# Patient Record
Sex: Male | Born: 1960
Health system: Southern US, Community
[De-identification: ages and names within clinical notes are randomized; demographics above are authoritative.]

## PROBLEM LIST (undated history)

## (undated) DIAGNOSIS — E785 Hyperlipidemia, unspecified: Secondary | ICD-10-CM

## (undated) HISTORY — PX: LIPOMA EXCISION: SHX5283

## (undated) HISTORY — DX: Hyperlipidemia, unspecified: E78.5

## (undated) HISTORY — PX: WISDOM TOOTH EXTRACTION: SHX21

---

## 2004-10-24 ENCOUNTER — Encounter: Admission: RE | Admit: 2004-10-24 | Discharge: 2004-10-24 | Payer: Self-pay | Admitting: Internal Medicine

## 2006-11-09 ENCOUNTER — Encounter: Admission: RE | Admit: 2006-11-09 | Discharge: 2006-11-09 | Payer: Self-pay | Admitting: Family Medicine

## 2006-11-09 ENCOUNTER — Emergency Department (HOSPITAL_COMMUNITY): Admission: EM | Admit: 2006-11-09 | Discharge: 2006-11-09 | Payer: Self-pay | Admitting: Emergency Medicine

## 2006-12-24 ENCOUNTER — Encounter: Admission: RE | Admit: 2006-12-24 | Discharge: 2006-12-24 | Payer: Self-pay | Admitting: *Deleted

## 2007-01-28 ENCOUNTER — Encounter: Admission: RE | Admit: 2007-01-28 | Discharge: 2007-01-28 | Payer: Self-pay | Admitting: *Deleted

## 2008-07-09 ENCOUNTER — Encounter: Admission: RE | Admit: 2008-07-09 | Discharge: 2008-07-09 | Payer: Self-pay | Admitting: Family Medicine

## 2010-10-09 HISTORY — PX: CHOLECYSTECTOMY: SHX55

## 2011-08-08 ENCOUNTER — Other Ambulatory Visit: Payer: Self-pay | Admitting: Family Medicine

## 2011-08-09 ENCOUNTER — Ambulatory Visit
Admission: RE | Admit: 2011-08-09 | Discharge: 2011-08-09 | Disposition: A | Discharge status: Home / Self Care | Payer: 59 | Source: Ambulatory Visit | Attending: Family Medicine | Admitting: Family Medicine

## 2011-08-25 ENCOUNTER — Encounter (INDEPENDENT_AMBULATORY_CARE_PROVIDER_SITE_OTHER): Payer: Self-pay | Admitting: General Surgery

## 2011-08-29 ENCOUNTER — Ambulatory Visit (INDEPENDENT_AMBULATORY_CARE_PROVIDER_SITE_OTHER): Payer: 59 | Admitting: General Surgery

## 2011-08-29 ENCOUNTER — Encounter (INDEPENDENT_AMBULATORY_CARE_PROVIDER_SITE_OTHER): Payer: Self-pay | Admitting: Surgery

## 2011-09-06 ENCOUNTER — Encounter (INDEPENDENT_AMBULATORY_CARE_PROVIDER_SITE_OTHER): Payer: Self-pay | Admitting: Surgery

## 2011-09-11 ENCOUNTER — Encounter (INDEPENDENT_AMBULATORY_CARE_PROVIDER_SITE_OTHER): Payer: Self-pay | Admitting: Surgery

## 2011-09-11 ENCOUNTER — Ambulatory Visit (INDEPENDENT_AMBULATORY_CARE_PROVIDER_SITE_OTHER): Payer: 59 | Admitting: Surgery

## 2011-09-11 VITALS — BP 138/88 | HR 64 | Temp 98.4°F | Resp 16 | Ht 64.0 in | Wt 151.5 lb

## 2011-09-11 DIAGNOSIS — K8010 Calculus of gallbladder with chronic cholecystitis without obstruction: Secondary | ICD-10-CM

## 2011-09-11 NOTE — Progress Notes (Signed)
Addended by: Wynona Luna on: 09/11/2011 09:54 AM   Modules accepted: Orders

## 2011-09-11 NOTE — Patient Instructions (Signed)
Call our surgery schedulers at [phone] when you are ready to schedule your surgery. 

## 2011-09-11 NOTE — Progress Notes (Signed)
Patient ID: Barry Miranda, male   DOB: 1961-03-13, 50 y.o.   MRN: [id number]  Chief Complaint  Patient presents with  . New Evaluation    eval of symptomatic GB with stones    HPI Barry Miranda is a 50 y.o. male.  Referred by Dr. Abigail Miyamoto for evaluation of gallstones HPI This is a healthy 50 year old male who presents with a three-year history of intermittent symptoms related to his gallbladder. He had a severe attack about 3 years ago but has been avoiding any greasy foods. When he does see something greasy he developed a lot of bloating and also some back pain. He denies any nausea or diarrhea. Recently he saw Dr. Abigail Miyamoto for a routine physical. Liver function tests were normal. He sent him for another ultrasound which showed multiple mobile gallstones but no sign of cholecystitis. The patient is having more symptoms and presents now for evaluation. He also had a lipoma removed many years ago from his forehead that this has recurred and become noticeably larger. He is also requesting removal of this at the same time. Past Medical History  Diagnosis Date  . Hyperlipidemia     Past Surgical History  Procedure Date  . Lipoma excision     lipoma on forehead   . Wisdom tooth extraction     Family History  Problem Relation Age of Onset  . Hypertension Mother   . Diabetes Mother     Social History History  Substance Use Topics  . Smoking status: Current Everyday Smoker -- 0.1 packs/day  . Smokeless tobacco: Never Used  . Alcohol Use: 4.2 oz/week    7 Cans of beer per week    No Known Allergies  Current Outpatient Prescriptions  Medication Sig Dispense Refill  . Cholecalciferol (VITAMIN D PO) Take by mouth daily.          Review of Systems Review of Systems  Constitutional: Negative for fever, chills and unexpected weight change.  HENT: Negative for hearing loss, congestion, sore throat, trouble swallowing and voice change.   Eyes: Negative for visual disturbance.    Respiratory: Positive for cough. Negative for wheezing.   Cardiovascular: Negative for chest pain, palpitations and leg swelling.  Gastrointestinal: Positive for abdominal distention. Negative for nausea, vomiting, abdominal pain, diarrhea, constipation, blood in stool, anal bleeding and rectal pain.  Genitourinary: Negative for hematuria and difficulty urinating.  Musculoskeletal: Negative for arthralgias.  Skin: Negative for rash and wound.  Neurological: Negative for seizures, syncope, weakness and headaches.  Hematological: Negative for adenopathy. Does not bruise/bleed easily.  Psychiatric/Behavioral: Negative for confusion.    Blood pressure 138/88, pulse 64, temperature 98.4 F (36.9 C), temperature source Temporal, resp. rate 16, height 5\' 4"  (1.626 m), weight 151 lb 8 oz (68.72 kg).  Physical Exam Physical Exam WDWN in NAD Forehead - right side - 2 cm subcutaneous lipoma HEENT:  EOMI, sclera anicteric Neck:  No masses, no thyromegaly Lungs:  CTA bilaterally; normal respiratory effort CV:  Regular rate and rhythm; no murmurs Abd:  +bowel sounds, soft, non-tender, no masses Ext:  Well-perfused; no edema Skin:  Warm, dry; no sign of jaundice  Data Reviewed Ultrasound - cholelithiasis  Assessment    Chronic calculus cholecystitis 2 cm forehead subcutaneous lipoma     Plan    Laparoscopic cholecystectomy with intraoperative cholangiogram Removal of forehead lipoma I discussed the procedure in detail.  The patient was given Agricultural engineer.  We discussed the risks and benefits of a laparoscopic cholecystectomy and  possible cholangiogram including, but not limited to bleeding, infection, injury to surrounding structures such as the intestine or liver, bile leak, retained gallstones, need to convert to an open procedure, prolonged diarrhea, blood clots such as  DVT, common bile duct injury, anesthesia risks, and possible need for additional procedures.  The likelihood  of improvement in symptoms and return to the patient's normal status is good. We discussed the typical post-operative recovery course        Lenard Kampf K. 09/11/2011, 9:46 AM

## 2011-09-13 ENCOUNTER — Other Ambulatory Visit (INDEPENDENT_AMBULATORY_CARE_PROVIDER_SITE_OTHER): Payer: Self-pay | Admitting: General Surgery

## 2011-09-15 DIAGNOSIS — D1739 Benign lipomatous neoplasm of skin and subcutaneous tissue of other sites: Secondary | ICD-10-CM

## 2011-09-15 DIAGNOSIS — K8010 Calculus of gallbladder with chronic cholecystitis without obstruction: Secondary | ICD-10-CM

## 2011-09-18 ENCOUNTER — Telehealth (INDEPENDENT_AMBULATORY_CARE_PROVIDER_SITE_OTHER): Payer: Self-pay

## 2011-09-18 NOTE — Telephone Encounter (Signed)
Pt called concerned about some bleeding in the umbilical area.  I assured him that some oozing from lap surgery incisions is not uncommon, but to watch it carefully and if it continues to bleed contact our office.

## 2011-09-19 ENCOUNTER — Encounter (INDEPENDENT_AMBULATORY_CARE_PROVIDER_SITE_OTHER): Payer: Self-pay | Admitting: Surgery

## 2011-10-17 ENCOUNTER — Ambulatory Visit (INDEPENDENT_AMBULATORY_CARE_PROVIDER_SITE_OTHER): Payer: 59 | Admitting: Surgery

## 2011-10-17 ENCOUNTER — Encounter (INDEPENDENT_AMBULATORY_CARE_PROVIDER_SITE_OTHER): Payer: Self-pay | Admitting: Surgery

## 2011-10-17 ENCOUNTER — Encounter (INDEPENDENT_AMBULATORY_CARE_PROVIDER_SITE_OTHER): Payer: 59 | Admitting: Surgery

## 2011-10-17 VITALS — BP 126/92 | HR 84 | Temp 97.6°F | Ht 64.0 in | Wt 157.6 lb

## 2011-10-17 DIAGNOSIS — K8010 Calculus of gallbladder with chronic cholecystitis without obstruction: Secondary | ICD-10-CM

## 2011-10-17 NOTE — Progress Notes (Signed)
This patient is status post laparoscopic cholecystectomy with intraoperative cholangiogram.  The pathology report showed chronic cholecystitis.  The patient reports that the post-operative pain has resolved.  They have resumed a regular diet without problems and are having regular bowel movements.  We also excised a lipoma from his right forehead.  This incision is healing well with no sign of infection.  He has the unusual complaint of numbness over his entire right scalp.    On physical examination, all of the incisions are healing well with no signs of infection or bleeding.  Steri-strips have all been removed.  Impression:  The patient is doing well after laparoscopic cholecystectomy for chronic cholecystitis.  Plan:  The patient may resume full activity and regular diet.  They may follow-up with [place] on a PRN basis.   Wilmon Arms. Corliss Skains, MD, [facility]  10/17/2011 4:23 PM

## 2011-11-29 ENCOUNTER — Ambulatory Visit
Admission: RE | Admit: 2011-11-29 | Discharge: 2011-11-29 | Disposition: A | Discharge status: Home / Self Care | Payer: 59 | Source: Ambulatory Visit | Attending: Family Medicine | Admitting: Family Medicine

## 2011-11-29 ENCOUNTER — Other Ambulatory Visit: Payer: Self-pay | Admitting: Family Medicine

## 2011-11-29 DIAGNOSIS — R05 Cough: Secondary | ICD-10-CM

## 2013-09-01 ENCOUNTER — Other Ambulatory Visit: Payer: Self-pay | Admitting: Family Medicine

## 2013-09-01 DIAGNOSIS — E0590 Thyrotoxicosis, unspecified without thyrotoxic crisis or storm: Secondary | ICD-10-CM

## 2013-09-03 ENCOUNTER — Ambulatory Visit
Admission: RE | Admit: 2013-09-03 | Discharge: 2013-09-03 | Disposition: A | Discharge status: Home / Self Care | Payer: 59 | Source: Ambulatory Visit | Attending: Family Medicine | Admitting: Family Medicine

## 2013-09-03 DIAGNOSIS — E0590 Thyrotoxicosis, unspecified without thyrotoxic crisis or storm: Secondary | ICD-10-CM

## 2017-09-18 DIAGNOSIS — E7800 Pure hypercholesterolemia, unspecified: Secondary | ICD-10-CM | POA: Diagnosis not present

## 2017-09-18 DIAGNOSIS — Z0000 Encounter for general adult medical examination without abnormal findings: Secondary | ICD-10-CM | POA: Diagnosis not present

## 2017-09-18 DIAGNOSIS — Z23 Encounter for immunization: Secondary | ICD-10-CM | POA: Diagnosis not present

## 2017-09-18 DIAGNOSIS — E559 Vitamin D deficiency, unspecified: Secondary | ICD-10-CM | POA: Diagnosis not present

## 2017-10-26 DIAGNOSIS — R3129 Other microscopic hematuria: Secondary | ICD-10-CM | POA: Diagnosis not present

## 2018-03-23 ENCOUNTER — Inpatient Hospital Stay (HOSPITAL_COMMUNITY)
Admission: EM | Admit: 2018-03-23 | Discharge: 2018-04-04 | DRG: 025 | Disposition: A | Discharge status: Rehabilitation Facility | Payer: 59 | Attending: Neurosurgery | Admitting: Neurosurgery

## 2018-03-23 ENCOUNTER — Encounter (HOSPITAL_COMMUNITY): Payer: Self-pay | Admitting: Emergency Medicine

## 2018-03-23 ENCOUNTER — Other Ambulatory Visit: Payer: Self-pay

## 2018-03-23 DIAGNOSIS — I6200 Nontraumatic subdural hemorrhage, unspecified: Secondary | ICD-10-CM | POA: Diagnosis not present

## 2018-03-23 DIAGNOSIS — Z9049 Acquired absence of other specified parts of digestive tract: Secondary | ICD-10-CM

## 2018-03-23 DIAGNOSIS — S064XAA Epidural hemorrhage with loss of consciousness status unknown, initial encounter: Secondary | ICD-10-CM | POA: Diagnosis present

## 2018-03-23 DIAGNOSIS — G8194 Hemiplegia, unspecified affecting left nondominant side: Secondary | ICD-10-CM | POA: Diagnosis present

## 2018-03-23 DIAGNOSIS — J9600 Acute respiratory failure, unspecified whether with hypoxia or hypercapnia: Secondary | ICD-10-CM

## 2018-03-23 DIAGNOSIS — R519 Headache, unspecified: Secondary | ICD-10-CM

## 2018-03-23 DIAGNOSIS — R000 Tachycardia, unspecified: Secondary | ICD-10-CM

## 2018-03-23 DIAGNOSIS — R29701 NIHSS score 1: Secondary | ICD-10-CM | POA: Diagnosis present

## 2018-03-23 DIAGNOSIS — I6201 Nontraumatic acute subdural hemorrhage: Secondary | ICD-10-CM | POA: Diagnosis present

## 2018-03-23 DIAGNOSIS — D62 Acute posthemorrhagic anemia: Secondary | ICD-10-CM

## 2018-03-23 DIAGNOSIS — E870 Hyperosmolality and hypernatremia: Secondary | ICD-10-CM

## 2018-03-23 DIAGNOSIS — R51 Headache: Secondary | ICD-10-CM

## 2018-03-23 DIAGNOSIS — G939 Disorder of brain, unspecified: Secondary | ICD-10-CM

## 2018-03-23 DIAGNOSIS — E785 Hyperlipidemia, unspecified: Secondary | ICD-10-CM | POA: Diagnosis present

## 2018-03-23 DIAGNOSIS — J9690 Respiratory failure, unspecified, unspecified whether with hypoxia or hypercapnia: Secondary | ICD-10-CM

## 2018-03-23 DIAGNOSIS — K5900 Constipation, unspecified: Secondary | ICD-10-CM | POA: Diagnosis present

## 2018-03-23 DIAGNOSIS — G935 Compression of brain: Secondary | ICD-10-CM | POA: Diagnosis present

## 2018-03-23 DIAGNOSIS — G936 Cerebral edema: Secondary | ICD-10-CM | POA: Diagnosis present

## 2018-03-23 DIAGNOSIS — F17210 Nicotine dependence, cigarettes, uncomplicated: Secondary | ICD-10-CM | POA: Diagnosis present

## 2018-03-23 DIAGNOSIS — Z01818 Encounter for other preprocedural examination: Secondary | ICD-10-CM

## 2018-03-23 DIAGNOSIS — I10 Essential (primary) hypertension: Secondary | ICD-10-CM | POA: Diagnosis present

## 2018-03-23 DIAGNOSIS — Z720 Tobacco use: Secondary | ICD-10-CM

## 2018-03-23 DIAGNOSIS — S065XAA Traumatic subdural hemorrhage with loss of consciousness status unknown, initial encounter: Secondary | ICD-10-CM | POA: Diagnosis present

## 2018-03-23 DIAGNOSIS — S065X9A Traumatic subdural hemorrhage with loss of consciousness of unspecified duration, initial encounter: Secondary | ICD-10-CM | POA: Diagnosis present

## 2018-03-23 DIAGNOSIS — Z833 Family history of diabetes mellitus: Secondary | ICD-10-CM

## 2018-03-23 DIAGNOSIS — G9340 Encephalopathy, unspecified: Secondary | ICD-10-CM | POA: Diagnosis not present

## 2018-03-23 DIAGNOSIS — Z8249 Family history of ischemic heart disease and other diseases of the circulatory system: Secondary | ICD-10-CM

## 2018-03-23 DIAGNOSIS — G932 Benign intracranial hypertension: Secondary | ICD-10-CM | POA: Diagnosis present

## 2018-03-23 DIAGNOSIS — Z4659 Encounter for fitting and adjustment of other gastrointestinal appliance and device: Secondary | ICD-10-CM

## 2018-03-23 DIAGNOSIS — S064X9A Epidural hemorrhage with loss of consciousness of unspecified duration, initial encounter: Secondary | ICD-10-CM | POA: Diagnosis present

## 2018-03-23 DIAGNOSIS — R9089 Other abnormal findings on diagnostic imaging of central nervous system: Secondary | ICD-10-CM

## 2018-03-23 MED ORDER — DEXAMETHASONE SODIUM PHOSPHATE 10 MG/ML IJ SOLN
10.0000 mg | Freq: Once | INTRAMUSCULAR | Status: AC
  Administered 2018-03-24: 10 mg via INTRAVENOUS
  Filled 2018-03-23: qty 1

## 2018-03-23 MED ORDER — DIPHENHYDRAMINE HCL 50 MG/ML IJ SOLN
25.0000 mg | Freq: Once | INTRAMUSCULAR | Status: AC
  Administered 2018-03-24: 25 mg via INTRAVENOUS
  Filled 2018-03-23: qty 1

## 2018-03-23 MED ORDER — METOCLOPRAMIDE HCL 5 MG/ML IJ SOLN
10.0000 mg | Freq: Once | INTRAMUSCULAR | Status: AC
  Administered 2018-03-24: 10 mg via INTRAVENOUS
  Filled 2018-03-23: qty 2

## 2018-03-23 MED ORDER — SODIUM CHLORIDE 0.9 % IV BOLUS
1000.0000 mL | Freq: Once | INTRAVENOUS | Status: AC
  Administered 2018-03-24: 1000 mL via INTRAVENOUS

## 2018-03-23 NOTE — ED Provider Notes (Signed)
[facility] DEPT Provider Note   CSN: [id number] Arrival date & time: 03/23/18  2056     History   Chief Complaint Chief Complaint  Patient presents with  . Headache    HPI Barry Miranda is a 57 y.o. male.  The history is provided by the patient and medical records.  Headache   Associated symptoms include nausea and vomiting.     57 year old male with history of hyperlipidemia, presenting to the ED with headache.  Wife reports headache has been intermittent for the past several days, but today has been constant.  Reports headache localized to forehead and top of his head.  Described as a pulsatile type pain.  He reports some associated nausea and vomiting as well as photophobia.  Symptoms are worse when standing up and moving.  He denies any dizziness, numbness, weakness, confusion, changes in speech, blurred vision, tinnitus, or difficulty walking.  No fever, chills, neck stiffness.  He is not currently on anticoagulation.  Denies any head trauma or falls.  No history of migraines.  He did take 2 aspirin earlier today without relief.   Past Medical History:  Diagnosis Date  . Hyperlipidemia     Patient Active Problem List   Diagnosis Date Noted  . Chronic calculus cholecystitis 09/11/2011    Past Surgical History:  Procedure Laterality Date  . CHOLECYSTECTOMY  2012  . LIPOMA EXCISION     lipoma on forehead   . WISDOM TOOTH EXTRACTION          Home Medications    Prior to Admission medications   Medication Sig Start Date End Date Taking? Authorizing Provider  Cholecalciferol (VITAMIN D PO) Take by mouth daily.      [provider]    Family History Family History  Problem Relation Age of Onset  . Hypertension Mother   . Diabetes Mother     Social History Social History   Tobacco Use  . Smoking status: Current Every Day Smoker    Packs/day: 0.15  . Smokeless tobacco: Never Used  Substance Use Topics  . Alcohol  use: Yes    Alcohol/week: 4.2 oz    Types: 7 Cans of beer per week  . Drug use: No     Allergies   Patient has no known allergies.   Review of Systems Review of Systems  Gastrointestinal: Positive for nausea and vomiting.  Neurological: Positive for headaches.  All other systems reviewed and are negative.    Physical Exam Updated Vital Signs BP (!) 174/94   Pulse 76   Temp 97.7 F (36.5 C) (Oral)   Resp 19   Ht 5\' 4"  (1.626 m)   Wt 68 kg (150 lb)   SpO2 98%   BMI 25.75 kg/m   Physical Exam  Constitutional: He is oriented to person, place, and time. He appears well-developed and well-nourished.  Sitting in dark room, eyes closed  HENT:  Head: Normocephalic and atraumatic.  Mouth/Throat: Oropharynx is clear and moist.  Eyes: Pupils are equal, round, and reactive to light. Conjunctivae and EOM are normal.  PERRL, EOMs intact  Neck: Normal range of motion. No neck rigidity.  No rigidty; full ROM  Cardiovascular: Normal rate, regular rhythm and normal heart sounds.  Pulmonary/Chest: Effort normal and breath sounds normal.  Abdominal: Soft. Bowel sounds are normal.  Musculoskeletal: Normal range of motion.  Neurological: He is alert and oriented to person, place, and time.  AAOx3, answering questions and following commands appropriately; equal  strength UE and LE bilaterally; CN grossly intact; moves all extremities appropriately without ataxia; no focal neuro deficits or facial asymmetry appreciated  Skin: Skin is warm and dry. No rash noted.  Psychiatric: He has a normal mood and affect.  Nursing note and vitals reviewed.    ED Treatments / Results  Labs (all labs ordered are listed, but only abnormal results are displayed) Labs Reviewed  CBC WITH DIFFERENTIAL/PLATELET - Abnormal; Notable for the following components:      Result Value   WBC 19.2 (*)    Neutro Abs 18.2 (*)    All other components within normal limits  COMPREHENSIVE METABOLIC PANEL - Abnormal;  Notable for the following components:   Potassium 3.4 (*)    CO2 21 (*)    Glucose, Bld 173 (*)    Calcium 8.7 (*)    All other components within normal limits  PROTIME-INR  APTT    EKG None  Radiology Ct Head Wo Contrast  Result Date: 03/24/2018 CLINICAL DATA:  Headache for 1 week, nausea and vomiting. Lethargy. History of hyperlipidemia. EXAM: CT HEAD WITHOUT CONTRAST TECHNIQUE: Contiguous axial images were obtained from the base of the skull through the vertex without intravenous contrast. COMPARISON:  CT HEAD December 24, 2006 FINDINGS: BRAIN: Dense RIGHT 1 cm RIGHT holo hemispheric subdural hematoma resulting in 10 mm of RIGHT-to-LEFT midline shift. Trace falcotentorial subdural hematoma. LEFT lateral ventricle entrapment. No intraparenchymal hemorrhage. No acute large vascular territory infarct. LEFT inferior perivascular space again noted. Basal cistern effacement. VASCULAR: Unremarkable. SKULL/SOFT TISSUES: No skull fracture. No significant soft tissue swelling. ORBITS/SINUSES: The included ocular globes and orbital contents are normal.The mastoid aircells and included paranasal sinuses are well-aerated. OTHER: None. IMPRESSION: 1. Acute 1 cm RIGHT holo hemispheric subdural hematoma resulting 1 cm RIGHT-to-LEFT subfalcine herniation with LEFT lateral ventricle entrapment. 2. Trace acute falcotentorial subdural hematoma. 3. Critical Value/emergent results were called by telephone at the time of interpretation on 03/24/2018 at 3:11 am to [facility] , who verbally acknowledged these results. Electronically Signed   By: Elon Alas M.D.   On: 03/24/2018 03:12    Procedures Procedures (including critical care time)  CRITICAL CARE Performed by: Larene Pickett   Total critical care time: 45 minutes  Critical care time was exclusive of separately billable procedures and treating other patients.  Critical care was necessary to treat or prevent imminent or life-threatening  deterioration.  Critical care was time spent personally by me on the following activities: development of treatment plan with patient and/or surrogate as well as nursing, discussions with consultants, evaluation of patient's response to treatment, examination of patient, obtaining history from patient or surrogate, ordering and performing treatments and interventions, ordering and review of laboratory studies, ordering and review of radiographic studies, pulse oximetry and re-evaluation of patient's condition.   Medications Ordered in ED Medications  sodium chloride 0.9 % bolus 1,000 mL (0 mLs Intravenous Stopped 03/24/18 0320)  dexamethasone (DECADRON) injection 10 mg (10 mg Intravenous Given 03/24/18 0019)  metoCLOPramide (REGLAN) injection 10 mg (10 mg Intravenous Given 03/24/18 0019)  diphenhydrAMINE (BENADRYL) injection 25 mg (25 mg Intravenous Given 03/24/18 0021)     Initial Impression / Assessment and Plan / ED Course  I have reviewed the triage vital signs and the nursing notes.  Pertinent labs & imaging results that were available during my care of the patient were reviewed by me and considered in my medical decision making (see chart for details).  57 year old male here with headache.  Has been intermittent over the past few days but today constant and progressively worsening.  Reports pain in his forehead and top of his head that is pulsatile in nature.  No focal numbness or weakness.  He is afebrile and nontoxic.  No nuchal rigidity or other signs of toxicity to suggest meningitis.  Neurologically intact.  No red flag symptoms at this time, no recent trauma, not on anti-coagulation.  Will treat symptomatically and reassess.  2:01 AM No improvement at all with any of medications given.  Will obtain CT head.  If negative, will provide further medications.  3:09 AM Received call from radiology, Dr. Lucilla Edin with acute subdural with 15mm right to left  midline shift and left  lateral ventricular entrapment.   Patient remains AAO at this time.  Wife questioned again, denies any recent head trauma.  He only took 2 aspirin today, no other anticoagulation.  Neurosurgery paged, family updated.  Have spoken with Dr. Vertell Limber-- has reviewed scan, patient will go to OR immediately at [facility].  He is aware coags have been sent and are pending right now.  [facility] truck on stand-by outside for emergent transport.  4:11 AM Patient reassessed-- starting to get a little sleepy but still responsive and following all commands.  He is protecting his airway at this time.  Vitals remain overall stable.  He acknowledged understanding of his need for transfer and surgical intervention.  Seizure precautions in place.  [facility] en route for transport.  4:27 AM [facility] here for transport.  Patient remains stable at time of transport.  Labs resulted, overall reassuring aside from leukocytosis.  coags WNL.  Final Clinical Impressions(s) / ED Diagnoses   Final diagnoses:  Subdural bleeding ([place])  Midline shift of brain  Bad headache    ED Discharge Orders    None       Larene Pickett, PA-C 03/24/18 0430    Molpus, Jenny Reichmann, MD 03/24/18 [phone]

## 2018-03-23 NOTE — ED Triage Notes (Signed)
Patient complaining of headache that he has had for a week. Patient states that he is feeling nauseas and has vomited today. Patient took two Asprin for the headache.

## 2018-03-24 ENCOUNTER — Emergency Department (HOSPITAL_COMMUNITY): Payer: 59 | Admitting: Anesthesiology

## 2018-03-24 ENCOUNTER — Encounter (HOSPITAL_COMMUNITY)
Admission: EM | Disposition: A | Discharge status: Rehabilitation Facility | Payer: Self-pay | Source: Home / Self Care | Attending: Neurosurgery

## 2018-03-24 ENCOUNTER — Inpatient Hospital Stay (HOSPITAL_COMMUNITY): Payer: 59 | Admitting: Anesthesiology

## 2018-03-24 ENCOUNTER — Encounter (HOSPITAL_COMMUNITY): Payer: Self-pay | Admitting: Radiology

## 2018-03-24 ENCOUNTER — Inpatient Hospital Stay (HOSPITAL_COMMUNITY): Payer: 59

## 2018-03-24 ENCOUNTER — Emergency Department (HOSPITAL_COMMUNITY): Payer: 59

## 2018-03-24 DIAGNOSIS — D62 Acute posthemorrhagic anemia: Secondary | ICD-10-CM | POA: Diagnosis not present

## 2018-03-24 DIAGNOSIS — R29701 NIHSS score 1: Secondary | ICD-10-CM | POA: Diagnosis not present

## 2018-03-24 DIAGNOSIS — R739 Hyperglycemia, unspecified: Secondary | ICD-10-CM | POA: Diagnosis not present

## 2018-03-24 DIAGNOSIS — G8918 Other acute postprocedural pain: Secondary | ICD-10-CM | POA: Diagnosis not present

## 2018-03-24 DIAGNOSIS — R000 Tachycardia, unspecified: Secondary | ICD-10-CM | POA: Diagnosis not present

## 2018-03-24 DIAGNOSIS — Z01818 Encounter for other preprocedural examination: Secondary | ICD-10-CM | POA: Diagnosis not present

## 2018-03-24 DIAGNOSIS — I6201 Nontraumatic acute subdural hemorrhage: Secondary | ICD-10-CM | POA: Diagnosis present

## 2018-03-24 DIAGNOSIS — S064X9A Epidural hemorrhage with loss of consciousness of unspecified duration, initial encounter: Secondary | ICD-10-CM | POA: Diagnosis not present

## 2018-03-24 DIAGNOSIS — R51 Headache: Secondary | ICD-10-CM | POA: Diagnosis not present

## 2018-03-24 DIAGNOSIS — D72829 Elevated white blood cell count, unspecified: Secondary | ICD-10-CM | POA: Diagnosis not present

## 2018-03-24 DIAGNOSIS — Z9049 Acquired absence of other specified parts of digestive tract: Secondary | ICD-10-CM | POA: Diagnosis not present

## 2018-03-24 DIAGNOSIS — R401 Stupor: Secondary | ICD-10-CM | POA: Diagnosis not present

## 2018-03-24 DIAGNOSIS — I619 Nontraumatic intracerebral hemorrhage, unspecified: Secondary | ICD-10-CM | POA: Diagnosis not present

## 2018-03-24 DIAGNOSIS — J9601 Acute respiratory failure with hypoxia: Secondary | ICD-10-CM | POA: Diagnosis not present

## 2018-03-24 DIAGNOSIS — G935 Compression of brain: Secondary | ICD-10-CM | POA: Diagnosis not present

## 2018-03-24 DIAGNOSIS — G932 Benign intracranial hypertension: Secondary | ICD-10-CM | POA: Diagnosis not present

## 2018-03-24 DIAGNOSIS — G9761 Postprocedural hematoma of a nervous system organ or structure following a nervous system procedure: Secondary | ICD-10-CM | POA: Diagnosis not present

## 2018-03-24 DIAGNOSIS — S065XAA Traumatic subdural hemorrhage with loss of consciousness status unknown, initial encounter: Secondary | ICD-10-CM | POA: Diagnosis present

## 2018-03-24 DIAGNOSIS — S065X0A Traumatic subdural hemorrhage without loss of consciousness, initial encounter: Secondary | ICD-10-CM | POA: Diagnosis not present

## 2018-03-24 DIAGNOSIS — J181 Lobar pneumonia, unspecified organism: Secondary | ICD-10-CM | POA: Diagnosis not present

## 2018-03-24 DIAGNOSIS — G8194 Hemiplegia, unspecified affecting left nondominant side: Secondary | ICD-10-CM | POA: Diagnosis not present

## 2018-03-24 DIAGNOSIS — D689 Coagulation defect, unspecified: Secondary | ICD-10-CM | POA: Diagnosis not present

## 2018-03-24 DIAGNOSIS — G9340 Encephalopathy, unspecified: Secondary | ICD-10-CM | POA: Diagnosis not present

## 2018-03-24 DIAGNOSIS — F17210 Nicotine dependence, cigarettes, uncomplicated: Secondary | ICD-10-CM | POA: Diagnosis not present

## 2018-03-24 DIAGNOSIS — G936 Cerebral edema: Secondary | ICD-10-CM | POA: Diagnosis not present

## 2018-03-24 DIAGNOSIS — I10 Essential (primary) hypertension: Secondary | ICD-10-CM | POA: Diagnosis not present

## 2018-03-24 DIAGNOSIS — R4182 Altered mental status, unspecified: Secondary | ICD-10-CM | POA: Diagnosis not present

## 2018-03-24 DIAGNOSIS — Z720 Tobacco use: Secondary | ICD-10-CM | POA: Diagnosis not present

## 2018-03-24 DIAGNOSIS — G441 Vascular headache, not elsewhere classified: Secondary | ICD-10-CM | POA: Diagnosis not present

## 2018-03-24 DIAGNOSIS — J9600 Acute respiratory failure, unspecified whether with hypoxia or hypercapnia: Secondary | ICD-10-CM | POA: Diagnosis not present

## 2018-03-24 DIAGNOSIS — E46 Unspecified protein-calorie malnutrition: Secondary | ICD-10-CM | POA: Diagnosis not present

## 2018-03-24 DIAGNOSIS — G479 Sleep disorder, unspecified: Secondary | ICD-10-CM | POA: Diagnosis not present

## 2018-03-24 DIAGNOSIS — S065X9A Traumatic subdural hemorrhage with loss of consciousness of unspecified duration, initial encounter: Secondary | ICD-10-CM | POA: Diagnosis not present

## 2018-03-24 DIAGNOSIS — R4020 Unspecified coma: Secondary | ICD-10-CM | POA: Diagnosis not present

## 2018-03-24 DIAGNOSIS — Z452 Encounter for adjustment and management of vascular access device: Secondary | ICD-10-CM | POA: Diagnosis not present

## 2018-03-24 DIAGNOSIS — Z4682 Encounter for fitting and adjustment of non-vascular catheter: Secondary | ICD-10-CM | POA: Diagnosis not present

## 2018-03-24 DIAGNOSIS — R0989 Other specified symptoms and signs involving the circulatory and respiratory systems: Secondary | ICD-10-CM | POA: Diagnosis not present

## 2018-03-24 DIAGNOSIS — R41844 Frontal lobe and executive function deficit: Secondary | ICD-10-CM | POA: Diagnosis not present

## 2018-03-24 DIAGNOSIS — K029 Dental caries, unspecified: Secondary | ICD-10-CM | POA: Diagnosis not present

## 2018-03-24 DIAGNOSIS — I69254 Hemiplegia and hemiparesis following other nontraumatic intracranial hemorrhage affecting left non-dominant side: Secondary | ICD-10-CM | POA: Diagnosis not present

## 2018-03-24 DIAGNOSIS — K5900 Constipation, unspecified: Secondary | ICD-10-CM | POA: Diagnosis present

## 2018-03-24 DIAGNOSIS — E870 Hyperosmolality and hypernatremia: Secondary | ICD-10-CM | POA: Diagnosis not present

## 2018-03-24 DIAGNOSIS — I6200 Nontraumatic subdural hemorrhage, unspecified: Secondary | ICD-10-CM | POA: Diagnosis not present

## 2018-03-24 DIAGNOSIS — Z8249 Family history of ischemic heart disease and other diseases of the circulatory system: Secondary | ICD-10-CM | POA: Diagnosis not present

## 2018-03-24 DIAGNOSIS — G939 Disorder of brain, unspecified: Secondary | ICD-10-CM | POA: Diagnosis not present

## 2018-03-24 DIAGNOSIS — E785 Hyperlipidemia, unspecified: Secondary | ICD-10-CM | POA: Diagnosis not present

## 2018-03-24 DIAGNOSIS — E876 Hypokalemia: Secondary | ICD-10-CM | POA: Diagnosis not present

## 2018-03-24 DIAGNOSIS — Z833 Family history of diabetes mellitus: Secondary | ICD-10-CM | POA: Diagnosis not present

## 2018-03-24 HISTORY — PX: CRANIOTOMY: SHX93

## 2018-03-24 LAB — CBC WITH DIFFERENTIAL/PLATELET
BASOS PCT: 0 %
Basophils Absolute: 0 10*3/uL (ref 0.0–0.1)
EOS ABS: 0 10*3/uL (ref 0.0–0.7)
Eosinophils Relative: 0 %
HCT: 46 % (ref 39.0–52.0)
HEMOGLOBIN: 16.2 g/dL (ref 13.0–17.0)
Lymphocytes Relative: 4 %
Lymphs Abs: 0.7 10*3/uL (ref 0.7–4.0)
MCH: 31.8 pg (ref 26.0–34.0)
MCHC: 35.2 g/dL (ref 30.0–36.0)
MCV: 90.2 fL (ref 78.0–100.0)
Monocytes Absolute: 0.2 10*3/uL (ref 0.1–1.0)
Monocytes Relative: 1 %
NEUTROS PCT: 95 %
Neutro Abs: 18.2 10*3/uL — ABNORMAL HIGH (ref 1.7–7.7)
Platelets: 231 10*3/uL (ref 150–400)
RBC: 5.1 MIL/uL (ref 4.22–5.81)
RDW: 13.2 % (ref 11.5–15.5)
WBC: 19.2 10*3/uL — AB (ref 4.0–10.5)

## 2018-03-24 LAB — POCT I-STAT 7, (LYTES, BLD GAS, ICA,H+H)
ACID-BASE DEFICIT: 4 mmol/L — AB (ref 0.0–2.0)
BICARBONATE: 21.1 mmol/L (ref 20.0–28.0)
CALCIUM ION: 0.99 mmol/L — AB (ref 1.15–1.40)
HCT: 35 % — ABNORMAL LOW (ref 39.0–52.0)
Hemoglobin: 11.9 g/dL — ABNORMAL LOW (ref 13.0–17.0)
O2 Saturation: 100 %
PH ART: 7.347 — AB (ref 7.350–7.450)
POTASSIUM: 3 mmol/L — AB (ref 3.5–5.1)
SODIUM: 140 mmol/L (ref 135–145)
TCO2: 22 mmol/L (ref 22–32)
pCO2 arterial: 38.4 mmHg (ref 32.0–48.0)
pO2, Arterial: 470 mmHg — ABNORMAL HIGH (ref 83.0–108.0)

## 2018-03-24 LAB — APTT: aPTT: 33 seconds (ref 24–36)

## 2018-03-24 LAB — COMPREHENSIVE METABOLIC PANEL
ALK PHOS: 59 U/L (ref 38–126)
ALT: 27 U/L (ref 17–63)
AST: 23 U/L (ref 15–41)
Albumin: 5 g/dL (ref 3.5–5.0)
Anion gap: 13 (ref 5–15)
BUN: 14 mg/dL (ref 6–20)
CALCIUM: 8.7 mg/dL — AB (ref 8.9–10.3)
CO2: 21 mmol/L — ABNORMAL LOW (ref 22–32)
CREATININE: 0.73 mg/dL (ref 0.61–1.24)
Chloride: 105 mmol/L (ref 101–111)
Glucose, Bld: 173 mg/dL — ABNORMAL HIGH (ref 65–99)
Potassium: 3.4 mmol/L — ABNORMAL LOW (ref 3.5–5.1)
Sodium: 139 mmol/L (ref 135–145)
Total Bilirubin: 0.5 mg/dL (ref 0.3–1.2)
Total Protein: 7.5 g/dL (ref 6.5–8.1)

## 2018-03-24 LAB — SURGICAL PCR SCREEN
MRSA, PCR: NEGATIVE
STAPHYLOCOCCUS AUREUS: NEGATIVE

## 2018-03-24 LAB — ABO/RH: ABO/RH(D): O POS

## 2018-03-24 LAB — PROTIME-INR
INR: 0.86
PROTHROMBIN TIME: 11.7 s (ref 11.4–15.2)

## 2018-03-24 LAB — TYPE AND SCREEN
ABO/RH(D): O POS
Antibody Screen: NEGATIVE

## 2018-03-24 LAB — MRSA PCR SCREENING: MRSA by PCR: NEGATIVE

## 2018-03-24 SURGERY — CRANIOTOMY HEMATOMA EVACUATION SUBDURAL
Anesthesia: General | Site: Head | Laterality: Right

## 2018-03-24 SURGERY — CRANIOTOMY HEMATOMA EVACUATION EPIDURAL
Anesthesia: General | Site: Head | Laterality: Right

## 2018-03-24 MED ORDER — FAMOTIDINE IN NACL 20-0.9 MG/50ML-% IV SOLN
20.0000 mg | Freq: Two times a day (BID) | INTRAVENOUS | Status: DC
  Administered 2018-03-24 – 2018-04-02 (×18): 20 mg via INTRAVENOUS
  Filled 2018-03-24 (×18): qty 50

## 2018-03-24 MED ORDER — BACITRACIN ZINC 500 UNIT/GM EX OINT
TOPICAL_OINTMENT | CUTANEOUS | Status: AC
  Filled 2018-03-24: qty 28.35

## 2018-03-24 MED ORDER — ROCURONIUM 10MG/ML (10ML) SYRINGE FOR MEDFUSION PUMP - OPTIME
INTRAVENOUS | Status: DC | PRN
  Administered 2018-03-24: 40 mg via INTRAVENOUS
  Administered 2018-03-24: 10 mg via INTRAVENOUS
  Administered 2018-03-24: 20 mg via INTRAVENOUS

## 2018-03-24 MED ORDER — DOCUSATE SODIUM 100 MG PO CAPS
100.0000 mg | ORAL_CAPSULE | Freq: Two times a day (BID) | ORAL | Status: DC
  Administered 2018-03-24 – 2018-03-31 (×9): 100 mg via ORAL
  Filled 2018-03-24 (×11): qty 1

## 2018-03-24 MED ORDER — KETOROLAC TROMETHAMINE 30 MG/ML IJ SOLN: 30.0000 mg | Freq: Once | INTRAMUSCULAR | Status: DC

## 2018-03-24 MED ORDER — HYDRALAZINE HCL 20 MG/ML IJ SOLN
INTRAMUSCULAR | Status: AC
  Filled 2018-03-24: qty 1

## 2018-03-24 MED ORDER — NICARDIPINE HCL IN NACL 20-0.86 MG/200ML-% IV SOLN
3.0000 mg/h | INTRAVENOUS | Status: DC
  Administered 2018-03-24 – 2018-03-25 (×2): 5 mg/h via INTRAVENOUS
  Filled 2018-03-24 (×2): qty 200

## 2018-03-24 MED ORDER — 0.9 % SODIUM CHLORIDE (POUR BTL) OPTIME
TOPICAL | Status: DC | PRN
  Administered 2018-03-24 (×2): 1000 mL

## 2018-03-24 MED ORDER — POTASSIUM CHLORIDE IN NACL 20-0.9 MEQ/L-% IV SOLN
INTRAVENOUS | Status: DC
  Administered 2018-03-24 – 2018-03-28 (×8): via INTRAVENOUS
  Filled 2018-03-24 (×8): qty 1000

## 2018-03-24 MED ORDER — ONDANSETRON HCL 4 MG/2ML IJ SOLN
INTRAMUSCULAR | Status: DC | PRN
  Administered 2018-03-24: 4 mg via INTRAVENOUS

## 2018-03-24 MED ORDER — MICROFIBRILLAR COLL HEMOSTAT EX PADS
MEDICATED_PAD | CUTANEOUS | Status: DC | PRN
  Administered 2018-03-24: 1 via TOPICAL

## 2018-03-24 MED ORDER — THROMBIN 5000 UNITS EX SOLR
CUTANEOUS | Status: AC
  Filled 2018-03-24: qty 5000

## 2018-03-24 MED ORDER — HYDRALAZINE HCL 20 MG/ML IJ SOLN
INTRAMUSCULAR | Status: DC | PRN
  Administered 2018-03-24: 5 mg via INTRAVENOUS

## 2018-03-24 MED ORDER — ESMOLOL HCL 100 MG/10ML IV SOLN
INTRAVENOUS | Status: DC | PRN
  Administered 2018-03-24 (×6): 20 mg via INTRAVENOUS

## 2018-03-24 MED ORDER — FENTANYL CITRATE (PF) 100 MCG/2ML IJ SOLN: 25.0000 ug | INTRAMUSCULAR | Status: DC | PRN

## 2018-03-24 MED ORDER — ALBUMIN HUMAN 5 % IV SOLN
INTRAVENOUS | Status: DC | PRN
  Administered 2018-03-24: via INTRAVENOUS

## 2018-03-24 MED ORDER — HYDROCODONE-ACETAMINOPHEN 5-325 MG PO TABS
1.0000 | ORAL_TABLET | ORAL | Status: DC | PRN
  Administered 2018-03-24: 1 via ORAL
  Filled 2018-03-24: qty 1

## 2018-03-24 MED ORDER — PHENYLEPHRINE HCL 10 MG/ML IJ SOLN
INTRAVENOUS | Status: DC | PRN
  Administered 2018-03-24: 50 ug/min via INTRAVENOUS

## 2018-03-24 MED ORDER — FENTANYL CITRATE (PF) 250 MCG/5ML IJ SOLN
INTRAMUSCULAR | Status: AC
  Filled 2018-03-24: qty 5

## 2018-03-24 MED ORDER — ONDANSETRON HCL 4 MG PO TABS
4.0000 mg | ORAL_TABLET | ORAL | Status: DC | PRN
  Administered 2018-03-26: 4 mg via ORAL
  Filled 2018-03-24: qty 1

## 2018-03-24 MED ORDER — FLEET ENEMA 7-19 GM/118ML RE ENEM: 1.0000 | ENEMA | Freq: Once | RECTAL | Status: DC | PRN

## 2018-03-24 MED ORDER — BUPIVACAINE-EPINEPHRINE 0.5% -1:200000 IJ SOLN
INTRAMUSCULAR | Status: DC | PRN
  Administered 2018-03-24: 9 mL

## 2018-03-24 MED ORDER — VITAMIN D 1000 UNITS PO TABS
2000.0000 [IU] | ORAL_TABLET | Freq: Every day | ORAL | Status: DC
Start: 2018-03-24 — End: 2018-04-04
  Administered 2018-03-24 – 2018-04-04 (×7): 2000 [IU] via ORAL
  Filled 2018-03-24 (×10): qty 2

## 2018-03-24 MED ORDER — BACITRACIN ZINC 500 UNIT/GM EX OINT
TOPICAL_OINTMENT | CUTANEOUS | Status: DC | PRN
  Administered 2018-03-24: 1 via TOPICAL

## 2018-03-24 MED ORDER — LEVETIRACETAM IN NACL 500 MG/100ML IV SOLN
500.0000 mg | Freq: Two times a day (BID) | INTRAVENOUS | Status: DC
  Administered 2018-03-24 – 2018-03-28 (×9): 500 mg via INTRAVENOUS
  Filled 2018-03-24 (×9): qty 100

## 2018-03-24 MED ORDER — POLYETHYLENE GLYCOL 3350 17 G PO PACK
17.0000 g | PACK | Freq: Every day | ORAL | Status: DC | PRN
  Administered 2018-04-02: 17 g via ORAL
  Filled 2018-03-24: qty 1

## 2018-03-24 MED ORDER — SODIUM CHLORIDE 0.9 % IV SOLN
INTRAVENOUS | Status: DC | PRN
  Administered 2018-03-24: 07:00:00 via INTRAVENOUS

## 2018-03-24 MED ORDER — SUGAMMADEX SODIUM 200 MG/2ML IV SOLN
INTRAVENOUS | Status: DC | PRN
  Administered 2018-03-24: 200 mg via INTRAVENOUS

## 2018-03-24 MED ORDER — LIDOCAINE HCL (CARDIAC) PF 100 MG/5ML IV SOSY
PREFILLED_SYRINGE | INTRAVENOUS | Status: DC | PRN
  Administered 2018-03-24: 60 mg via INTRATRACHEAL

## 2018-03-24 MED ORDER — LABETALOL HCL 5 MG/ML IV SOLN
10.0000 mg | INTRAVENOUS | Status: DC | PRN
  Administered 2018-03-24: 20 mg via INTRAVENOUS
  Administered 2018-03-24: 10 mg via INTRAVENOUS
  Administered 2018-03-24 – 2018-03-25 (×5): 40 mg via INTRAVENOUS
  Administered 2018-03-25: 20 mg via INTRAVENOUS
  Administered 2018-03-25 – 2018-03-26 (×4): 40 mg via INTRAVENOUS
  Administered 2018-03-27: 10 mg via INTRAVENOUS
  Administered 2018-03-28: 40 mg via INTRAVENOUS
  Administered 2018-03-28 – 2018-03-29 (×5): 20 mg via INTRAVENOUS
  Administered 2018-04-02: 10 mg via INTRAVENOUS
  Filled 2018-03-24: qty 8
  Filled 2018-03-24 (×2): qty 4
  Filled 2018-03-24 (×2): qty 8
  Filled 2018-03-24: qty 4
  Filled 2018-03-24 (×5): qty 8
  Filled 2018-03-24 (×2): qty 4
  Filled 2018-03-24: qty 8
  Filled 2018-03-24: qty 4
  Filled 2018-03-24 (×6): qty 8
  Filled 2018-03-24 (×2): qty 4

## 2018-03-24 MED ORDER — CEFAZOLIN SODIUM-DEXTROSE 1-4 GM/50ML-% IV SOLN
1.0000 g | Freq: Three times a day (TID) | INTRAVENOUS | Status: AC
  Administered 2018-03-24 (×2): 1 g via INTRAVENOUS
  Filled 2018-03-24 (×2): qty 50

## 2018-03-24 MED ORDER — SODIUM CHLORIDE 0.9 % IV SOLN
INTRAVENOUS | Status: DC | PRN
  Administered 2018-03-24: 05:00:00 via INTRAVENOUS

## 2018-03-24 MED ORDER — THROMBIN 20000 UNITS EX SOLR
CUTANEOUS | Status: DC | PRN
  Administered 2018-03-24: 20 mL

## 2018-03-24 MED ORDER — BISACODYL 10 MG RE SUPP: 10.0000 mg | Freq: Every day | RECTAL | Status: DC | PRN

## 2018-03-24 MED ORDER — FENTANYL CITRATE (PF) 250 MCG/5ML IJ SOLN
INTRAMUSCULAR | Status: DC | PRN
  Administered 2018-03-24 (×4): 50 ug via INTRAVENOUS

## 2018-03-24 MED ORDER — CEFAZOLIN SODIUM-DEXTROSE 2-3 GM-%(50ML) IV SOLR
INTRAVENOUS | Status: DC | PRN
  Administered 2018-03-24: 2 g via INTRAVENOUS

## 2018-03-24 MED ORDER — PROCHLORPERAZINE EDISYLATE 10 MG/2ML IJ SOLN: 10.0000 mg | Freq: Once | INTRAMUSCULAR | Status: DC

## 2018-03-24 MED ORDER — BUPIVACAINE HCL (PF) 0.5 % IJ SOLN
INTRAMUSCULAR | Status: AC
  Filled 2018-03-24: qty 30

## 2018-03-24 MED ORDER — THROMBIN 20000 UNITS EX SOLR
CUTANEOUS | Status: AC
  Filled 2018-03-24: qty 20000

## 2018-03-24 MED ORDER — SIMVASTATIN 20 MG PO TABS
20.0000 mg | ORAL_TABLET | Freq: Every evening | ORAL | Status: DC
  Administered 2018-03-24: 20 mg via ORAL
  Filled 2018-03-24: qty 1

## 2018-03-24 MED ORDER — PROPOFOL 10 MG/ML IV BOLUS
INTRAVENOUS | Status: DC | PRN
  Administered 2018-03-24: 180 mg via INTRAVENOUS

## 2018-03-24 MED ORDER — EPHEDRINE SULFATE 50 MG/ML IJ SOLN
INTRAMUSCULAR | Status: DC | PRN
  Administered 2018-03-24: 10 mg via INTRAVENOUS

## 2018-03-24 MED ORDER — LACTATED RINGERS IV SOLN
INTRAVENOUS | Status: DC | PRN
  Administered 2018-03-24: 05:00:00 via INTRAVENOUS

## 2018-03-24 MED ORDER — PROPOFOL 10 MG/ML IV BOLUS
INTRAVENOUS | Status: AC
  Filled 2018-03-24: qty 20

## 2018-03-24 MED ORDER — LABETALOL HCL 5 MG/ML IV SOLN
INTRAVENOUS | Status: AC
  Filled 2018-03-24: qty 4

## 2018-03-24 MED ORDER — MORPHINE SULFATE (PF) 2 MG/ML IV SOLN
1.0000 mg | INTRAVENOUS | Status: DC | PRN
  Administered 2018-03-24 (×2): 2 mg via INTRAVENOUS
  Filled 2018-03-24 (×2): qty 1

## 2018-03-24 MED ORDER — THROMBIN 5000 UNITS EX SOLR
OROMUCOSAL | Status: DC | PRN
  Administered 2018-03-24: 5 mL via TOPICAL

## 2018-03-24 MED ORDER — ONDANSETRON HCL 4 MG/2ML IJ SOLN
4.0000 mg | INTRAMUSCULAR | Status: DC | PRN
  Administered 2018-03-24 – 2018-03-28 (×5): 4 mg via INTRAVENOUS
  Filled 2018-03-24 (×5): qty 2

## 2018-03-24 MED ORDER — LIDOCAINE-EPINEPHRINE 1 %-1:100000 IJ SOLN
INTRAMUSCULAR | Status: AC
  Filled 2018-03-24: qty 1

## 2018-03-24 MED ORDER — LIDOCAINE-EPINEPHRINE 1 %-1:100000 IJ SOLN
INTRAMUSCULAR | Status: DC | PRN
  Administered 2018-03-24: 9 mL via INTRADERMAL

## 2018-03-24 MED ORDER — PROMETHAZINE HCL 25 MG PO TABS: 12.5000 mg | ORAL_TABLET | ORAL | Status: DC | PRN

## 2018-03-24 SURGICAL SUPPLY — 79 items
BASKET BONE COLLECTION (BASKET) IMPLANT
BIT DRILL WIRE PASS 1.3MM (BIT) IMPLANT
BNDG CMPR 75X41 PLY HI ABS (GAUZE/BANDAGES/DRESSINGS)
BNDG GAUZE ELAST 4 BULKY (GAUZE/BANDAGES/DRESSINGS) IMPLANT
BNDG STRETCH 4X75 STRL LF (GAUZE/BANDAGES/DRESSINGS) IMPLANT
BUR ACORN 6.0 PRECISION (BURR) ×2 IMPLANT
BUR ACORN 6.0MM PRECISION (BURR) ×1
BUR SPIRAL ROUTER 2.3 (BUR) IMPLANT
BUR SPIRAL ROUTER 2.3MM (BUR)
CANISTER SUCT 3000ML PPV (MISCELLANEOUS) ×3 IMPLANT
CARTRIDGE OIL MAESTRO DRILL (MISCELLANEOUS) ×1 IMPLANT
CATH ROBINSON RED A/P 12FR (CATHETERS) IMPLANT
CLIP VESOCCLUDE MED 6/CT (CLIP) IMPLANT
DECANTER SPIKE VIAL GLASS SM (MISCELLANEOUS) ×3 IMPLANT
DIFFUSER DRILL AIR PNEUMATIC (MISCELLANEOUS) ×3 IMPLANT
DRAIN PENROSE 1/2X12 LTX STRL (WOUND CARE) IMPLANT
DRAPE NEUROLOGICAL W/INCISE (DRAPES) ×3 IMPLANT
DRAPE WARM FLUID 44X44 (DRAPE) ×3 IMPLANT
DRILL WIRE PASS 1.3MM (BIT)
DRSG OPSITE 4X5.5 SM (GAUZE/BANDAGES/DRESSINGS) ×6 IMPLANT
DRSG PAD ABDOMINAL 8X10 ST (GAUZE/BANDAGES/DRESSINGS) IMPLANT
DRSG TELFA 3X8 NADH (GAUZE/BANDAGES/DRESSINGS) ×3 IMPLANT
DURAMATRIX ONLAY 2X2 (Neuro Prosthesis/Implant) ×2 IMPLANT
DURAPREP 26ML APPLICATOR (WOUND CARE) ×2 IMPLANT
DURAPREP 6ML APPLICATOR 50/CS (WOUND CARE) ×1 IMPLANT
ELECT REM PT RETURN 9FT ADLT (ELECTROSURGICAL) ×3
ELECTRODE REM PT RTRN 9FT ADLT (ELECTROSURGICAL) ×1 IMPLANT
EVACUATOR SILICONE 100CC (DRAIN) IMPLANT
GAUZE SPONGE 4X4 12PLY STRL (GAUZE/BANDAGES/DRESSINGS) IMPLANT
GAUZE SPONGE 4X4 16PLY XRAY LF (GAUZE/BANDAGES/DRESSINGS) IMPLANT
GLOVE BIO SURGEON STRL SZ 6.5 (GLOVE) ×1 IMPLANT
GLOVE BIO SURGEON STRL SZ7 (GLOVE) ×8 IMPLANT
GLOVE BIO SURGEON STRL SZ8 (GLOVE) ×3 IMPLANT
GLOVE BIO SURGEONS STRL SZ 6.5 (GLOVE) ×1
GLOVE BIOGEL PI IND STRL 8 (GLOVE) ×1 IMPLANT
GLOVE BIOGEL PI IND STRL 8.5 (GLOVE) ×1 IMPLANT
GLOVE BIOGEL PI INDICATOR 8 (GLOVE) ×2
GLOVE BIOGEL PI INDICATOR 8.5 (GLOVE) ×2
GLOVE ECLIPSE 8.0 STRL XLNG CF (GLOVE) ×3 IMPLANT
GLOVE EXAM NITRILE LRG STRL (GLOVE) IMPLANT
GLOVE EXAM NITRILE XL STR (GLOVE) IMPLANT
GLOVE EXAM NITRILE XS STR PU (GLOVE) IMPLANT
GOWN STRL REUS W/ TWL LRG LVL3 (GOWN DISPOSABLE) IMPLANT
GOWN STRL REUS W/ TWL XL LVL3 (GOWN DISPOSABLE) IMPLANT
GOWN STRL REUS W/TWL 2XL LVL3 (GOWN DISPOSABLE) IMPLANT
GOWN STRL REUS W/TWL LRG LVL3 (GOWN DISPOSABLE)
GOWN STRL REUS W/TWL XL LVL3 (GOWN DISPOSABLE)
HEMOSTAT SURGICEL 2X14 (HEMOSTASIS) ×3 IMPLANT
KIT BASIN OR (CUSTOM PROCEDURE TRAY) ×3 IMPLANT
KIT TURNOVER KIT B (KITS) ×3 IMPLANT
NDL HYPO 25X1 1.5 SAFETY (NEEDLE) ×1 IMPLANT
NEEDLE HYPO 25X1 1.5 SAFETY (NEEDLE) ×3 IMPLANT
NS IRRIG 1000ML POUR BTL (IV SOLUTION) ×3 IMPLANT
OIL CARTRIDGE MAESTRO DRILL (MISCELLANEOUS) ×3
PACK CRANIOTOMY CUSTOM (CUSTOM PROCEDURE TRAY) ×3 IMPLANT
PAD ARMBOARD 7.5X6 YLW CONV (MISCELLANEOUS) ×3 IMPLANT
PAD DRESSING TELFA 3X8 NADH (GAUZE/BANDAGES/DRESSINGS) IMPLANT
PATTIES SURGICAL .5 X.5 (GAUZE/BANDAGES/DRESSINGS) IMPLANT
PATTIES SURGICAL .5 X3 (DISPOSABLE) IMPLANT
PATTIES SURGICAL 1X1 (DISPOSABLE) IMPLANT
PIN MAYFIELD SKULL DISP (PIN) IMPLANT
PLATE 1.5  2HOLE LNG NEURO (Plate) ×10 IMPLANT
PLATE 1.5 2HOLE LNG NEURO (Plate) IMPLANT
SCREW SELF DRILL HT 1.5/4MM (Screw) ×20 IMPLANT
SPECIMEN JAR SMALL (MISCELLANEOUS) IMPLANT
SPONGE NEURO XRAY DETECT 1X3 (DISPOSABLE) IMPLANT
SPONGE SURGIFOAM ABS GEL 100 (HEMOSTASIS) IMPLANT
STAPLER SKIN PROX WIDE 3.9 (STAPLE) ×3 IMPLANT
SUT ETHILON 3 0 FSL (SUTURE) ×2 IMPLANT
SUT ETHILON 3 0 PS 1 (SUTURE) IMPLANT
SUT NURALON 4 0 TR CR/8 (SUTURE) ×6 IMPLANT
SUT VIC AB 2-0 CP2 18 (SUTURE) ×6 IMPLANT
SUT VIC AB 3-0 SH 8-18 (SUTURE) IMPLANT
SYR CONTROL 10ML LL (SYRINGE) IMPLANT
TOWEL GREEN STERILE (TOWEL DISPOSABLE) ×3 IMPLANT
TOWEL GREEN STERILE FF (TOWEL DISPOSABLE) ×3 IMPLANT
TRAY FOLEY MTR SLVR 16FR STAT (SET/KITS/TRAYS/PACK) IMPLANT
UNDERPAD 30X30 (UNDERPADS AND DIAPERS) IMPLANT
WATER STERILE IRR 1000ML POUR (IV SOLUTION) ×3 IMPLANT

## 2018-03-24 SURGICAL SUPPLY — 66 items
BAG DECANTER FOR FLEXI CONT (MISCELLANEOUS) ×3 IMPLANT
BASKET BONE COLLECTION (BASKET) IMPLANT
BATTERY IQ STERILE (MISCELLANEOUS) ×5 IMPLANT
BIT DRILL WIRE PASS 1.3MM (BIT) IMPLANT
BNDG CMPR 75X41 PLY ABS (GAUZE/BANDAGES/DRESSINGS) ×1
BNDG GAUZE ELAST 4 BULKY (GAUZE/BANDAGES/DRESSINGS) ×5 IMPLANT
BNDG STRETCH 4X75 NS LF (GAUZE/BANDAGES/DRESSINGS) ×2 IMPLANT
BTRY SRG DRVR 1.5 IQ (MISCELLANEOUS) ×2
BUR ACORN 6.0 (BURR) ×1 IMPLANT
BUR ACORN 6.0MM (BURR)
BUR SPIRAL ROUTER 2.3 (BUR) IMPLANT
BUR SPIRAL ROUTER 2.3MM (BUR)
CANISTER SUCT 3000ML PPV (MISCELLANEOUS) ×3 IMPLANT
CLIP VESOCCLUDE MED 6/CT (CLIP) IMPLANT
DECANTER SPIKE VIAL GLASS SM (MISCELLANEOUS) ×3 IMPLANT
DRAIN CHANNEL 10M FLAT 3/4 FLT (DRAIN) IMPLANT
DRAIN PENROSE 1/2X12 LTX STRL (WOUND CARE) IMPLANT
DRAPE WARM FLUID 44X44 (DRAPE) ×3 IMPLANT
DRILL WIRE PASS 1.3MM (BIT)
DRSG ADAPTIC 3X8 NADH LF (GAUZE/BANDAGES/DRESSINGS) IMPLANT
DRSG PAD ABDOMINAL 8X10 ST (GAUZE/BANDAGES/DRESSINGS) IMPLANT
DURAPREP 6ML APPLICATOR 50/CS (WOUND CARE) ×3 IMPLANT
ELECT REM PT RETURN 9FT ADLT (ELECTROSURGICAL) ×3
ELECTRODE REM PT RTRN 9FT ADLT (ELECTROSURGICAL) ×1 IMPLANT
EVACUATOR SILICONE 100CC (DRAIN) IMPLANT
GAUZE SPONGE 4X4 12PLY STRL (GAUZE/BANDAGES/DRESSINGS) ×2 IMPLANT
GAUZE SPONGE 4X4 16PLY XRAY LF (GAUZE/BANDAGES/DRESSINGS) IMPLANT
GLOVE BIO SURGEON STRL SZ7 (GLOVE) ×6 IMPLANT
GLOVE BIOGEL PI IND STRL 7.5 (GLOVE) IMPLANT
GLOVE BIOGEL PI IND STRL 8.5 (GLOVE) ×1 IMPLANT
GLOVE BIOGEL PI INDICATOR 7.5 (GLOVE) ×2
GLOVE BIOGEL PI INDICATOR 8.5 (GLOVE) ×2
GLOVE ECLIPSE 8.5 STRL (GLOVE) ×3 IMPLANT
GLOVE EXAM NITRILE LRG STRL (GLOVE) IMPLANT
GLOVE EXAM NITRILE XL STR (GLOVE) IMPLANT
GLOVE EXAM NITRILE XS STR PU (GLOVE) IMPLANT
GOWN STRL REUS W/ TWL LRG LVL3 (GOWN DISPOSABLE) IMPLANT
GOWN STRL REUS W/ TWL XL LVL3 (GOWN DISPOSABLE) ×1 IMPLANT
GOWN STRL REUS W/TWL 2XL LVL3 (GOWN DISPOSABLE) ×3 IMPLANT
GOWN STRL REUS W/TWL LRG LVL3 (GOWN DISPOSABLE) ×3
GOWN STRL REUS W/TWL XL LVL3 (GOWN DISPOSABLE)
HEMOSTAT SURGICEL 2X14 (HEMOSTASIS) IMPLANT
HOOK DURA (MISCELLANEOUS) ×3 IMPLANT
KIT BASIN OR (CUSTOM PROCEDURE TRAY) ×3 IMPLANT
KIT TURNOVER KIT B (KITS) ×3 IMPLANT
NEEDLE HYPO 22GX1.5 SAFETY (NEEDLE) ×3 IMPLANT
NS IRRIG 1000ML POUR BTL (IV SOLUTION) ×3 IMPLANT
PACK CRANIOTOMY CUSTOM (CUSTOM PROCEDURE TRAY) ×3 IMPLANT
PATTIES SURGICAL .5 X.5 (GAUZE/BANDAGES/DRESSINGS) IMPLANT
PATTIES SURGICAL .5 X3 (DISPOSABLE) IMPLANT
PATTIES SURGICAL 1X1 (DISPOSABLE) IMPLANT
PIN MAYFIELD SKULL DISP (PIN) IMPLANT
SCREW SELF DRILL HT 1.5/4MM (Screw) ×8 IMPLANT
SPECIMEN JAR SMALL (MISCELLANEOUS) IMPLANT
SPONGE NEURO XRAY DETECT 1X3 (DISPOSABLE) IMPLANT
SPONGE SURGIFOAM ABS GEL 100 (HEMOSTASIS) IMPLANT
STAPLER SKIN PROX WIDE 3.9 (STAPLE) ×5 IMPLANT
SUT ETHILON 3 0 FSL (SUTURE) IMPLANT
SUT NURALON 4 0 TR CR/8 (SUTURE) ×6 IMPLANT
SUT VIC AB 2-0 CP2 18 (SUTURE) ×6 IMPLANT
TAPE CLOTH 1X10 TAN NS (GAUZE/BANDAGES/DRESSINGS) ×2 IMPLANT
TOWEL GREEN STERILE (TOWEL DISPOSABLE) ×3 IMPLANT
TOWEL GREEN STERILE FF (TOWEL DISPOSABLE) ×3 IMPLANT
TRAY FOLEY MTR SLVR 16FR STAT (SET/KITS/TRAYS/PACK) IMPLANT
UNDERPAD 30X30 (UNDERPADS AND DIAPERS) IMPLANT
WATER STERILE IRR 1000ML POUR (IV SOLUTION) ×3 IMPLANT

## 2018-03-24 NOTE — Anesthesia Preprocedure Evaluation (Signed)
Anesthesia Evaluation  Patient identified by MRN, date of birth, ID band Patient awake    Reviewed: Allergy & Precautions, NPO status , Patient's Chart, lab work & pertinent test results  Airway Mallampati: III  TM Distance: >3 FB Neck ROM: Full    Dental  (+) Dental Advisory Given   Pulmonary Current Smoker,    breath sounds clear to auscultation       Cardiovascular negative cardio ROS   Rhythm:Regular Rate:Normal - Diastolic murmurs    Neuro/Psych Right subdural hematoma    GI/Hepatic negative GI ROS, Neg liver ROS,   Endo/Other  negative endocrine ROS  Renal/GU negative Renal ROS     Musculoskeletal   Abdominal   Peds  Hematology negative hematology ROS (+)   Anesthesia Other Findings Pt very somnolent. Oriented to place but drifts back off to sleep. Pt intermittently falls back asleep but remains arousable to voice. Follows commands.  Reproductive/Obstetrics                             Lab Results  Component Value Date   WBC 19.2 (H) 03/24/2018   HGB 16.2 03/24/2018   HCT 46.0 03/24/2018   MCV 90.2 03/24/2018   PLT 231 03/24/2018   Lab Results  Component Value Date   CREATININE 0.73 03/24/2018   BUN 14 03/24/2018   NA 139 03/24/2018   K 3.4 (L) 03/24/2018   CL 105 03/24/2018   CO2 21 (L) 03/24/2018    Anesthesia Physical Anesthesia Plan  ASA: IV and emergent  Anesthesia Plan: General   Post-op Pain Management:    Induction: Intravenous  PONV Risk Score and Plan: 1 and Ondansetron, Dexamethasone and Treatment may vary due to age or medical condition  Airway Management Planned: Oral ETT  Additional Equipment: Arterial line  Intra-op Plan:   Post-operative Plan: Possible Post-op intubation/ventilation  Informed Consent: I have reviewed the patients History and Physical, chart, labs and discussed the procedure including the risks, benefits and alternatives for  the proposed anesthesia with the patient or authorized representative who has indicated his/her understanding and acceptance.   Dental advisory given  Plan Discussed with: CRNA  Anesthesia Plan Comments:         Anesthesia Quick Evaluation

## 2018-03-24 NOTE — Progress Notes (Signed)
Subjective: Patient reports awake, alert, conversant.  Objective: Vital signs in last 24 hours: Temp:  [97.3 F (36.3 C)-98.1 F (36.7 C)] 98.1 F (36.7 C) (06/16 0900) Pulse Rate:  [62-103] 103 (06/16 0900) Resp:  [11-28] 17 (06/16 0900) BP: (138-184)/(80-106) 156/95 (06/16 0900) SpO2:  [95 %-99 %] 98 % (06/16 0900) Arterial Line BP: (184-225)/(67-85) 213/81 (06/16 0900) Weight:  [68 kg (150 lb)-69.5 kg (153 lb 3.5 oz)] 69.5 kg (153 lb 3.5 oz) (06/16 0900)  Intake/Output from previous day: 06/15 0701 - 06/16 0700 In: 2900 [I.V.:1900; IV Piggyback:1000] Out: 575 [Urine:575] Intake/Output this shift: Total I/O In: 576 [I.V.:300; Blood:276] Out: 1132 [Urine:985; Drains:97; Blood:50]  Physical Exam: PERRL, EOMI.  MAEW with full strength.  No drift.  Dressings CDI.  Patient has no recollection of the events prior to his surgery.  Lab Results: Recent Labs    03/24/18 0345 03/24/18 0638  WBC 19.2*  --   HGB 16.2 11.9*  HCT 46.0 35.0*  PLT 231  --    BMET Recent Labs    03/24/18 0345 03/24/18 0638  NA 139 140  K 3.4* 3.0*  CL 105  --   CO2 21*  --   GLUCOSE 173*  --   BUN 14  --   CREATININE 0.73  --   CALCIUM 8.7*  --     Studies/Results: Ct Head Wo Contrast  Result Date: 03/24/2018 CLINICAL DATA:  Headache for 1 week, nausea and vomiting. Lethargy. History of hyperlipidemia. EXAM: CT HEAD WITHOUT CONTRAST TECHNIQUE: Contiguous axial images were obtained from the base of the skull through the vertex without intravenous contrast. COMPARISON:  CT HEAD December 24, 2006 FINDINGS: BRAIN: Dense RIGHT 1 cm RIGHT holo hemispheric subdural hematoma resulting in 10 mm of RIGHT-to-LEFT midline shift. Trace falcotentorial subdural hematoma. LEFT lateral ventricle entrapment. No intraparenchymal hemorrhage. No acute large vascular territory infarct. LEFT inferior perivascular space again noted. Basal cistern effacement. VASCULAR: Unremarkable. SKULL/SOFT TISSUES: No skull fracture.  No significant soft tissue swelling. ORBITS/SINUSES: The included ocular globes and orbital contents are normal.The mastoid aircells and included paranasal sinuses are well-aerated. OTHER: None. IMPRESSION: 1. Acute 1 cm RIGHT holo hemispheric subdural hematoma resulting 1 cm RIGHT-to-LEFT subfalcine herniation with LEFT lateral ventricle entrapment. 2. Trace acute falcotentorial subdural hematoma. 3. Critical Value/emergent results were called by telephone at the time of interpretation on 03/24/2018 at 3:11 am to [facility] , who verbally acknowledged these results. Electronically Signed   By: Elon Alas M.D.   On: 03/24/2018 03:12    Assessment/Plan: Patient is doing well.  Much more awake.      LOS: 0 days    Peggyann Shoals, MD 03/24/2018, 9:11 AM

## 2018-03-24 NOTE — ED Notes (Signed)
Pt. Appeared to be sleepy but easily arouseable to calling,Pt. Requested to use the bathroom, bedpan offered , pt. Able to follow command.

## 2018-03-24 NOTE — Anesthesia Postprocedure Evaluation (Signed)
Anesthesia Post Note  Patient: Barry Miranda  Procedure(s) Performed: CRANIOTOMY, HEMATOMA EVACUATION SUBDURAL, RIGHT SIDE (Right Head)     Patient location during evaluation: PACU Anesthesia Type: General Level of consciousness: awake Pain management: pain level controlled Vital Signs Assessment: post-procedure vital signs reviewed and stable Respiratory status: spontaneous breathing Cardiovascular status: stable Anesthetic complications: no    Last Vitals:  Vitals:   03/24/18 0815 03/24/18 0830  BP: (!) 144/84 138/84  Pulse: 96 97  Resp: 11 11  Temp:  36.5 C  SpO2: 96% 95%    Last Pain:  Vitals:   03/24/18 0824  TempSrc:   PainSc: Asleep                 Aarthi Uyeno

## 2018-03-24 NOTE — Anesthesia Procedure Notes (Signed)
Performed by: Valetta Fuller, CRNA

## 2018-03-24 NOTE — Anesthesia Procedure Notes (Signed)
Procedure Name: Intubation Date/Time: 03/24/2018 11:44 PM Performed by: Suzy Bouchard, CRNA Pre-anesthesia Checklist: Patient identified, Emergency Drugs available, Suction available, Patient being monitored and Timeout performed Patient Re-evaluated:Patient Re-evaluated prior to induction Oxygen Delivery Method: Circle system utilized Preoxygenation: Pre-oxygenation with 100% oxygen Induction Type: IV induction, Cricoid Pressure applied and Rapid sequence Laryngoscope Size: Glidescope and 3 Grade View: Grade I Tube type: Subglottic suction tube Tube size: 7.5 mm Number of attempts: 1 Airway Equipment and Method: Stylet Placement Confirmation: ETT inserted through vocal cords under direct vision,  positive ETCO2 and breath sounds checked- equal and bilateral Secured at: 22 cm Tube secured with: Tape Dental Injury: Teeth and Oropharynx as per pre-operative assessment

## 2018-03-24 NOTE — Op Note (Signed)
03/23/2018 - 03/24/2018  7:30 AM  PATIENT:  Barry Miranda  57 y.o. male  PRE-OPERATIVE DIAGNOSIS:  RIGHT SUBDURAL HEMATOMA  POST-OPERATIVE DIAGNOSIS:  RIGHT SUBDURAL HEMATOMA  PROCEDURE:  Procedure(s): CRANIOTOMY, HEMATOMA EVACUATION SUBDURAL, RIGHT SIDE (Right)  SURGEON:  Surgeon(s) and Role:    Erline Levine, MD - Primary  PHYSICIAN ASSISTANT:   ASSISTANTS: none   ANESTHESIA:   general  EBL:  50 mL   BLOOD ADMINISTERED:none  DRAINS: (10) Jackson-Pratt drain(s) with closed bulb suction in the subdural space   LOCAL MEDICATIONS USED:  MARCAINE    and LIDOCAINE   SPECIMEN:  No Specimen  DISPOSITION OF SPECIMEN:  N/A  COUNTS:  YES  TOURNIQUET:  * No tourniquets in log *  DICTATION: Patient is 57 year old man who  Presented to [facility]  with worsening headache, nausea and vomiting.  Head CT shows panhemispheric right SDH with mass effect and  shift.It was elected to take patient to surgery for craniotomy for SDH.  Procedure:  Following smooth intubation, patient was placed in left semi-lateral position with blanket roll.  Head was placed on donut head holder and right frontotemporal scalp was shaved and prepped and draped in usual sterile fashion.  Area of planned incision was infiltrated with lidocaine. A reverse question mark incision was made and carried through temporalis fascia and muscle to expose calvarium.  Skull flap was elevated exposing subdural hematoma.  Dura was opened and subdural was evacuated.  This was acute blood and under pressure.   This was evacuated and a considerable amount of blood was removed. The brain was irrigated.    Hemostasis was assured. The brain came back up after hematoma evacuation.  A small venous bleeding point on the surface of the brain was cauterized with bipolar electrocautery.  There was some generalized oozing and, because the patient had been taking aspirin, I gave the patient platelets.  The dura was closed with 4-0 neurilon sutures after  placing a #10 JP drain, bone flap was replaced with plates.  Dural tack up stitches were placed.  The fascia and galea were closed with 2-0 vicryl sutures and the skin was re approximated with staples.  A sterile occlusive dressing was placed.  Patient was extubated and taken to recovery in stable condition having tolerated his operation well.   PLAN OF CARE: Admit to inpatient   PATIENT DISPOSITION:  PACU - hemodynamically stable.   Delay start of Pharmacological VTE agent (>24hrs) due to surgical blood loss or risk of bleeding: yes

## 2018-03-24 NOTE — Progress Notes (Signed)
Blood oozing from around JP site on right side of head, dressing saturated and reinforced. JP output has decreased since oozing started. Ellene Route, MD paged via [place].

## 2018-03-24 NOTE — Anesthesia Procedure Notes (Signed)
Procedure Name: Intubation Date/Time: 03/24/2018 5:25 AM Performed by: Valetta Fuller, CRNA Pre-anesthesia Checklist: Patient identified, Emergency Drugs available, Suction available and Patient being monitored Patient Re-evaluated:Patient Re-evaluated prior to induction Oxygen Delivery Method: Circle system utilized Preoxygenation: Pre-oxygenation with 100% oxygen Induction Type: IV induction Ventilation: Mask ventilation without difficulty Laryngoscope Size: Miller and 2 Grade View: Grade II Tube type: Subglottic suction tube Tube size: 7.5 mm Number of attempts: 1 Airway Equipment and Method: Bougie stylet Placement Confirmation: ETT inserted through vocal cords under direct vision,  positive ETCO2 and breath sounds checked- equal and bilateral Secured at: 23 cm Tube secured with: Tape Dental Injury: Teeth and Oropharynx as per pre-operative assessment

## 2018-03-24 NOTE — Anesthesia Procedure Notes (Signed)
Arterial Line Insertion Start/End6/16/2019 5:00 AM, 03/24/2018 5:10 AM Performed by: Valetta Fuller, CRNA, CRNA  Lidocaine 1% used for infiltration Left, radial was placed Catheter size: 20 G Hand hygiene performed  and maximum sterile barriers used   Attempts: 1 Procedure performed without using ultrasound guided technique. Following insertion, dressing applied and Biopatch. Patient tolerated the procedure well with no immediate complications.

## 2018-03-24 NOTE — Transfer of Care (Signed)
Immediate Anesthesia Transfer of Care Note  Patient: Barry Miranda  Procedure(s) Performed: CRANIOTOMY, HEMATOMA EVACUATION SUBDURAL, RIGHT SIDE (Right Head)  Patient Location: PACU  Anesthesia Type:General  Level of Consciousness: oriented, drowsy and patient cooperative  Airway & Oxygen Therapy: Patient Spontanous Breathing  Post-op Assessment: Report given to RN, Post -op Vital signs reviewed and stable and Patient moving all extremities X 4  Post vital signs: Reviewed and stable  Last Vitals:  Vitals Value Taken Time  BP 180/106 03/24/2018  7:55 AM  Temp 36.3 C 03/24/2018  7:54 AM  Pulse 96 03/24/2018  8:01 AM  Resp 12 03/24/2018  8:01 AM  SpO2 97 % 03/24/2018  8:01 AM  Vitals shown include unvalidated device data.  Last Pain:  Vitals:   03/24/18 0754  TempSrc:   PainSc: 0-No pain      Patients Stated Pain Goal: 0 (07/29/10 7356)  Complications: No apparent anesthesia complications

## 2018-03-24 NOTE — H&P (Signed)
Reason for Consult:subdural hematoma Referring Physician: Baird Cancer, PA  Barry Miranda is an 57 y.o. male.  HPI: 57 year old male with history of hyperlipidemia, presenting to the ED with headache.  Wife reports headache has been intermittent for the past several days, but today has been constant.  Reports headache localized to forehead and top of his head.  Described as a pulsatile type pain.  He reports some associated nausea and vomiting as well as photophobia.  Symptoms are worse when standing up and moving.  He denies any dizziness, numbness, weakness, confusion, changes in speech, blurred vision, tinnitus, or difficulty walking.  No fever, chills, neck stiffness.  He is not currently on anticoagulation.  Denies any head trauma or falls.  No history of migraines.  He did take 2 aspirin earlier today without relief.   On arrival in [facility] OR, patient is somnolent, but arousable.    Past Medical History:  Diagnosis Date  . Hyperlipidemia     Past Surgical History:  Procedure Laterality Date  . CHOLECYSTECTOMY  2012  . LIPOMA EXCISION     lipoma on forehead   . WISDOM TOOTH EXTRACTION      Family History  Problem Relation Age of Onset  . Hypertension Mother   . Diabetes Mother     Social History:  reports that he has been smoking.  He has been smoking about 0.15 packs per day. He has never used smokeless tobacco. He reports that he drinks about 4.2 oz of alcohol per week. He reports that he does not use drugs.  Allergies: No Known Allergies  Medications: I have reviewed the patient's current medications.  Results for orders placed or performed during the hospital encounter of 03/23/18 (from the past 48 hour(s))  CBC with Differential     Status: Abnormal   Collection Time: 03/24/18  3:45 AM  Result Value Ref Range   WBC 19.2 (H) 4.0 - 10.5 K/uL   RBC 5.10 4.22 - 5.81 MIL/uL   Hemoglobin 16.2 13.0 - 17.0 g/dL   HCT 46.0 39.0 - 52.0 %   MCV 90.2 78.0 - 100.0 fL   MCH 31.8 26.0 -  34.0 pg   MCHC 35.2 30.0 - 36.0 g/dL   RDW 13.2 11.5 - 15.5 %   Platelets 231 150 - 400 K/uL   Neutrophils Relative % 95 %   Neutro Abs 18.2 (H) 1.7 - 7.7 K/uL   Lymphocytes Relative 4 %   Lymphs Abs 0.7 0.7 - 4.0 K/uL   Monocytes Relative 1 %   Monocytes Absolute 0.2 0.1 - 1.0 K/uL   Eosinophils Relative 0 %   Eosinophils Absolute 0.0 0.0 - 0.7 K/uL   Basophils Relative 0 %   Basophils Absolute 0.0 0.0 - 0.1 K/uL    Comment: Performed at [facility], [place] [address], [place], [place] [address]    Ct Head Wo Contrast  Result Date: 03/24/2018 CLINICAL DATA:  Headache for 1 week, nausea and vomiting. Lethargy. History of hyperlipidemia. EXAM: CT HEAD WITHOUT CONTRAST TECHNIQUE: Contiguous axial images were obtained from the base of the skull through the vertex without intravenous contrast. COMPARISON:  CT HEAD December 24, 2006 FINDINGS: BRAIN: Dense RIGHT 1 cm RIGHT holo hemispheric subdural hematoma resulting in 10 mm of RIGHT-to-LEFT midline shift. Trace falcotentorial subdural hematoma. LEFT lateral ventricle entrapment. No intraparenchymal hemorrhage. No acute large vascular territory infarct. LEFT inferior perivascular space again noted. Basal cistern effacement. VASCULAR: Unremarkable. SKULL/SOFT TISSUES: No skull fracture. No significant soft  tissue swelling. ORBITS/SINUSES: The included ocular globes and orbital contents are normal.The mastoid aircells and included paranasal sinuses are well-aerated. OTHER: None. IMPRESSION: 1. Acute 1 cm RIGHT holo hemispheric subdural hematoma resulting 1 cm RIGHT-to-LEFT subfalcine herniation with LEFT lateral ventricle entrapment. 2. Trace acute falcotentorial subdural hematoma. 3. Critical Value/emergent results were called by telephone at the time of interpretation on 03/24/2018 at 3:11 am to [facility] , who verbally acknowledged these results. Electronically Signed   By: Elon Alas M.D.   On: 03/24/2018 03:12    Review of  Systems - Negative except Nausea, vomiting, hreadache    Blood pressure (!) 174/80, pulse 80, temperature 97.7 F (36.5 C), temperature source Oral, resp. rate 17, height 5\' 4"  (1.626 m), weight 68 kg (150 lb), SpO2 98 %. Physical Exam  Constitutional: He is oriented to person, place, and time. He appears well-developed and well-nourished. He appears lethargic.  HENT:  Head: Normocephalic and atraumatic.  Eyes: Pupils are equal, round, and reactive to light. EOM are normal.  Neck: Normal range of motion.  Neurological: He is oriented to person, place, and time. He appears lethargic. No cranial nerve deficit or sensory deficit. GCS eye subscore is 4. GCS verbal subscore is 4. GCS motor subscore is 6.  Patient is somnolent, requiring vigorous stimulation to wake up.  Responds to voice.  Left hemiparesis and left pronator drift.  Will follow commands with all four extremities.    Assessment/Plan: Patient is sleepy with left hemiparesis.  States name, generally not talkative.  Complains of headache.  Larhe pan-hemispheric SDH on the right.  To OR for emergent right craniotomy for SDH.  Peggyann Shoals, MD 03/24/2018, 4:11 AM

## 2018-03-24 NOTE — Progress Notes (Signed)
Patient ID: Barry Miranda, male   DOB: [birth year], 57 y.o.   MRN: [id number] Called about patient's condition around 830.  Change of shift nurse had noted that patient had vomited about 700 cc and was extremely difficult to arouse.  When aroused however he would follow commands but complained severely of head pain.  Previous nurse had noted that he complained of head pain and was requiring maximum doses of narcotic analgesia and attributed somnolence to pain medication.  CT scan of head was ordered and this demonstrates that there is what appears to be a large acute epidural hematoma under the bone flap.  This causes significant shift and mass-effect equaling his preoperative evaluation CT. I have advised that the patient undergo craniotomy for evacuation of epidural hematoma.  Discussed situation with the patient and the wife.  Patient though somnolent concurs that something definitive needs to be done because of the degree of head pain he is experiencing.

## 2018-03-24 NOTE — Anesthesia Preprocedure Evaluation (Addendum)
Anesthesia Evaluation  Patient identified by MRN, date of birth, ID band Patient confused  Preop documentation limited or incomplete due to emergent nature of procedure.  Airway Mallampati: II  TM Distance: >3 FB     Dental  (+)    Pulmonary Current Smoker,    breath sounds clear to auscultation       Cardiovascular  Rhythm:Regular Rate:Normal     Neuro/Psych    GI/Hepatic   Endo/Other    Renal/GU      Musculoskeletal   Abdominal   Peds  Hematology   Anesthesia Other Findings   Reproductive/Obstetrics                            Anesthesia Physical Anesthesia Plan  ASA: III  Anesthesia Plan: General   Post-op Pain Management:    Induction: Intravenous  PONV Risk Score and Plan: Treatment may vary due to age or medical condition  Airway Management Planned: Oral ETT  Additional Equipment:   Intra-op Plan:   Post-operative Plan: Possible Post-op intubation/ventilation  Informed Consent: I have reviewed the patients History and Physical, chart, labs and discussed the procedure including the risks, benefits and alternatives for the proposed anesthesia with the patient or authorized representative who has indicated his/her understanding and acceptance.   Dental advisory given  Plan Discussed with: CRNA and Anesthesiologist  Anesthesia Plan Comments:         Anesthesia Quick Evaluation

## 2018-03-24 NOTE — Brief Op Note (Signed)
03/23/2018 - 03/24/2018  7:30 AM  PATIENT:  Barry Miranda  57 y.o. male  PRE-OPERATIVE DIAGNOSIS:  RIGHT SUBDURAL HEMATOMA  POST-OPERATIVE DIAGNOSIS:  RIGHT SUBDURAL HEMATOMA  PROCEDURE:  Procedure(s): CRANIOTOMY, HEMATOMA EVACUATION SUBDURAL, RIGHT SIDE (Right)  SURGEON:  Surgeon(s) and Role:    Erline Levine, MD - Primary  PHYSICIAN ASSISTANT:   ASSISTANTS: none   ANESTHESIA:   general  EBL:  50 mL   BLOOD ADMINISTERED:none  DRAINS: (10) Jackson-Pratt drain(s) with closed bulb suction in the subdural space   LOCAL MEDICATIONS USED:  MARCAINE    and LIDOCAINE   SPECIMEN:  No Specimen  DISPOSITION OF SPECIMEN:  N/A  COUNTS:  YES  TOURNIQUET:  * No tourniquets in log *  DICTATION: Patient is 57 year old man who  Presented to [facility]  with worsening headache, nausea and vomiting.  Head CT shows panhemispheric right SDH with mass effect and  shift.It was elected to take patient to surgery for craniotomy for SDH.  Procedure:  Following smooth intubation, patient was placed in left semi-lateral position with blanket roll.  Head was placed on donut head holder and right frontotemporal scalp was shaved and prepped and draped in usual sterile fashion.  Area of planned incision was infiltrated with lidocaine. A reverse question mark incision was made and carried through temporalis fascia and muscle to expose calvarium.  Skull flap was elevated exposing subdural hematoma.  Dura was opened and subdural was evacuated.  This was acute blood and under pressure.   This was evacuated and a considerable amount of blood was removed. The brain was irrigated.    Hemostasis was assured. The brain came back up after hematoma evacuation.  A small venous bleeding point on the surface of the brain was cauterized with bipolar electrocautery.  There was some generalized oozing and, because the patient had been taking aspirin, I gave the patient platelets.  The dura was closed with 4-0 neurilon sutures after  placing a #10 JP drain, bone flap was replaced with plates.  Dural tack up stitches were placed.  The fascia and galea were closed with 2-0 vicryl sutures and the skin was re approximated with staples.  A sterile occlusive dressing was placed.  Patient was extubated and taken to recovery in stable condition having tolerated his operation well.   PLAN OF CARE: Admit to inpatient   PATIENT DISPOSITION:  PACU - hemodynamically stable.   Delay start of Pharmacological VTE agent (>24hrs) due to surgical blood loss or risk of bleeding: yes

## 2018-03-25 ENCOUNTER — Encounter (HOSPITAL_COMMUNITY): Payer: Self-pay | Admitting: Neurosurgery

## 2018-03-25 ENCOUNTER — Inpatient Hospital Stay (HOSPITAL_COMMUNITY): Payer: 59

## 2018-03-25 DIAGNOSIS — S065X9A Traumatic subdural hemorrhage with loss of consciousness of unspecified duration, initial encounter: Secondary | ICD-10-CM

## 2018-03-25 DIAGNOSIS — J9601 Acute respiratory failure with hypoxia: Secondary | ICD-10-CM

## 2018-03-25 DIAGNOSIS — J9600 Acute respiratory failure, unspecified whether with hypoxia or hypercapnia: Secondary | ICD-10-CM

## 2018-03-25 DIAGNOSIS — S064XAA Epidural hemorrhage with loss of consciousness status unknown, initial encounter: Secondary | ICD-10-CM | POA: Diagnosis present

## 2018-03-25 DIAGNOSIS — S064X9A Epidural hemorrhage with loss of consciousness of unspecified duration, initial encounter: Secondary | ICD-10-CM | POA: Diagnosis present

## 2018-03-25 LAB — URINALYSIS, COMPLETE (UACMP) WITH MICROSCOPIC
BILIRUBIN URINE: NEGATIVE
GLUCOSE, UA: NEGATIVE mg/dL
HGB URINE DIPSTICK: NEGATIVE
KETONES UR: NEGATIVE mg/dL
LEUKOCYTES UA: NEGATIVE
NITRITE: NEGATIVE
Protein, ur: NEGATIVE mg/dL
RBC / HPF: NONE SEEN RBC/hpf (ref 0–5)
Specific Gravity, Urine: 1.025 (ref 1.005–1.030)
pH: 6 (ref 5.0–8.0)

## 2018-03-25 LAB — CBC
HCT: 34 % — ABNORMAL LOW (ref 39.0–52.0)
Hemoglobin: 11.6 g/dL — ABNORMAL LOW (ref 13.0–17.0)
MCH: 31.3 pg (ref 26.0–34.0)
MCHC: 34.1 g/dL (ref 30.0–36.0)
MCV: 91.6 fL (ref 78.0–100.0)
Platelets: 224 10*3/uL (ref 150–400)
RBC: 3.71 MIL/uL — AB (ref 4.22–5.81)
RDW: 13.6 % (ref 11.5–15.5)
WBC: 15 10*3/uL — ABNORMAL HIGH (ref 4.0–10.5)

## 2018-03-25 LAB — PREPARE PLATELET PHERESIS: UNIT DIVISION: 0

## 2018-03-25 LAB — BLOOD GAS, ARTERIAL
Acid-base deficit: 1.1 mmol/L (ref 0.0–2.0)
BICARBONATE: 23.5 mmol/L (ref 20.0–28.0)
Drawn by: 51702
FIO2: 0.6
O2 Saturation: 99.1 %
PEEP/CPAP: 5 cmH2O
PH ART: 7.37 (ref 7.350–7.450)
Patient temperature: 97.5
RATE: 16 resp/min
VT: 470 mL
pCO2 arterial: 41.4 mmHg (ref 32.0–48.0)
pO2, Arterial: 218 mmHg — ABNORMAL HIGH (ref 83.0–108.0)

## 2018-03-25 LAB — BPAM PLATELET PHERESIS
Blood Product Expiration Date: 201906162359
ISSUE DATE / TIME: 201906160641
UNIT TYPE AND RH: 7300

## 2018-03-25 LAB — PHOSPHORUS: Phosphorus: 3.2 mg/dL (ref 2.5–4.6)

## 2018-03-25 LAB — BASIC METABOLIC PANEL
Anion gap: 8 (ref 5–15)
BUN: 9 mg/dL (ref 6–20)
CHLORIDE: 110 mmol/L (ref 101–111)
CO2: 23 mmol/L (ref 22–32)
Calcium: 7.6 mg/dL — ABNORMAL LOW (ref 8.9–10.3)
Creatinine, Ser: 0.74 mg/dL (ref 0.61–1.24)
GFR calc Af Amer: >60 mL/min (ref >60)
GFR calc non Af Amer: >60 mL/min (ref >60)
Glucose, Bld: 147 mg/dL — ABNORMAL HIGH (ref 65–99)
Potassium: 3.6 mmol/L (ref 3.5–5.1)
Sodium: 141 mmol/L (ref 135–145)

## 2018-03-25 LAB — TRIGLYCERIDES: Triglycerides: 78 mg/dL (ref <150)

## 2018-03-25 LAB — MAGNESIUM: Magnesium: 1.9 mg/dL (ref 1.7–2.4)

## 2018-03-25 MED ORDER — THROMBIN 20000 UNITS EX SOLR
CUTANEOUS | Status: DC | PRN
  Administered 2018-03-25: 20 mL

## 2018-03-25 MED ORDER — BACITRACIN ZINC 500 UNIT/GM EX OINT
TOPICAL_OINTMENT | CUTANEOUS | Status: DC | PRN
  Administered 2018-03-25: 1 via TOPICAL

## 2018-03-25 MED ORDER — CHLORHEXIDINE GLUCONATE 0.12% ORAL RINSE (MEDLINE KIT)
15.0000 mL | Freq: Two times a day (BID) | OROMUCOSAL | Status: DC
  Administered 2018-03-25: 15 mL via OROMUCOSAL

## 2018-03-25 MED ORDER — PROPOFOL 1000 MG/100ML IV EMUL
0.0000 ug/kg/min | INTRAVENOUS | Status: DC
  Administered 2018-03-25: 25 ug/kg/min via INTRAVENOUS
  Filled 2018-03-25: qty 100

## 2018-03-25 MED ORDER — SENNA 8.6 MG PO TABS
1.0000 | ORAL_TABLET | Freq: Two times a day (BID) | ORAL | Status: DC
  Administered 2018-03-25 – 2018-04-04 (×13): 8.6 mg via ORAL
  Filled 2018-03-25 (×14): qty 1

## 2018-03-25 MED ORDER — ORAL CARE MOUTH RINSE
15.0000 mL | Freq: Two times a day (BID) | OROMUCOSAL | Status: DC
  Administered 2018-03-25 – 2018-03-30 (×6): 15 mL via OROMUCOSAL

## 2018-03-25 MED ORDER — LEVETIRACETAM IN NACL 500 MG/100ML IV SOLN: 500.0000 mg | Freq: Two times a day (BID) | INTRAVENOUS | Status: DC

## 2018-03-25 MED ORDER — ONDANSETRON HCL 4 MG/2ML IJ SOLN: 4.0000 mg | INTRAMUSCULAR | Status: DC | PRN

## 2018-03-25 MED ORDER — COAGULATION FACTOR VIIA RECOMB 1 MG IV SOLR
90.0000 ug/kg | Freq: Once | INTRAVENOUS | Status: AC
  Administered 2018-03-25: 6000 ug via INTRAVENOUS
  Filled 2018-03-25: qty 6

## 2018-03-25 MED ORDER — POLYETHYLENE GLYCOL 3350 17 G PO PACK: 17.0000 g | PACK | Freq: Every day | ORAL | Status: DC | PRN

## 2018-03-25 MED ORDER — PHENYLEPHRINE HCL 10 MG/ML IJ SOLN
INTRAMUSCULAR | Status: DC | PRN
  Administered 2018-03-25 (×2): 80 ug via INTRAVENOUS

## 2018-03-25 MED ORDER — SODIUM CHLORIDE 0.9 % IV SOLN
INTRAVENOUS | Status: DC | PRN
  Administered 2018-03-24: 23:00:00 via INTRAVENOUS

## 2018-03-25 MED ORDER — SODIUM CHLORIDE 0.9 % IV SOLN
INTRAVENOUS | Status: DC | PRN
  Administered 2018-03-25: 500 mL

## 2018-03-25 MED ORDER — ORAL CARE MOUTH RINSE
15.0000 mL | OROMUCOSAL | Status: DC
  Administered 2018-03-25 (×4): 15 mL via OROMUCOSAL

## 2018-03-25 MED ORDER — LIDOCAINE 2% (20 MG/ML) 5 ML SYRINGE
INTRAMUSCULAR | Status: AC
  Filled 2018-03-25: qty 5

## 2018-03-25 MED ORDER — FLEET ENEMA 7-19 GM/118ML RE ENEM: 1.0000 | ENEMA | Freq: Once | RECTAL | Status: DC | PRN

## 2018-03-25 MED ORDER — ACETAMINOPHEN 160 MG/5ML PO SOLN
650.0000 mg | Freq: Four times a day (QID) | ORAL | Status: DC | PRN
  Administered 2018-03-25: 650 mg
  Filled 2018-03-25: qty 20.3

## 2018-03-25 MED ORDER — LABETALOL HCL 5 MG/ML IV SOLN: 10.0000 mg | INTRAVENOUS | Status: DC | PRN

## 2018-03-25 MED ORDER — FENTANYL CITRATE (PF) 100 MCG/2ML IJ SOLN: 100.0000 ug | INTRAMUSCULAR | Status: DC | PRN

## 2018-03-25 MED ORDER — ARTIFICIAL TEARS OPHTHALMIC OINT
TOPICAL_OINTMENT | OPHTHALMIC | Status: DC | PRN
  Administered 2018-03-24: 1 via OPHTHALMIC

## 2018-03-25 MED ORDER — ROCURONIUM BROMIDE 50 MG/5ML IV SOLN
INTRAVENOUS | Status: AC
  Filled 2018-03-25: qty 1

## 2018-03-25 MED ORDER — ACETAMINOPHEN 325 MG PO TABS
650.0000 mg | ORAL_TABLET | ORAL | Status: DC | PRN
  Administered 2018-03-25 – 2018-04-03 (×7): 650 mg via ORAL
  Filled 2018-03-25 (×7): qty 2

## 2018-03-25 MED ORDER — PROMETHAZINE HCL 25 MG PO TABS
12.5000 mg | ORAL_TABLET | ORAL | Status: DC | PRN
Start: 2018-03-25 — End: 2018-03-25

## 2018-03-25 MED ORDER — SUCCINYLCHOLINE CHLORIDE 20 MG/ML IJ SOLN
INTRAMUSCULAR | Status: DC | PRN
  Administered 2018-03-24: 120 mg via INTRAVENOUS

## 2018-03-25 MED ORDER — 0.9 % SODIUM CHLORIDE (POUR BTL) OPTIME
TOPICAL | Status: DC | PRN
  Administered 2018-03-25 (×2): 1000 mL

## 2018-03-25 MED ORDER — PROPOFOL 10 MG/ML IV BOLUS
INTRAVENOUS | Status: DC | PRN
  Administered 2018-03-24: 140 mg via INTRAVENOUS

## 2018-03-25 MED ORDER — DOCUSATE SODIUM 100 MG PO CAPS: 100.0000 mg | ORAL_CAPSULE | Freq: Two times a day (BID) | ORAL | Status: DC

## 2018-03-25 MED ORDER — PHENYLEPHRINE 40 MCG/ML (10ML) SYRINGE FOR IV PUSH (FOR BLOOD PRESSURE SUPPORT)
PREFILLED_SYRINGE | INTRAVENOUS | Status: AC
  Filled 2018-03-25: qty 10

## 2018-03-25 MED ORDER — FENTANYL CITRATE (PF) 100 MCG/2ML IJ SOLN
100.0000 ug | INTRAMUSCULAR | Status: DC | PRN
  Administered 2018-03-25: 100 ug via INTRAVENOUS
  Filled 2018-03-25: qty 2

## 2018-03-25 MED ORDER — ACETAMINOPHEN 160 MG/5ML PO SOLN: 650.0000 mg | ORAL | Status: DC | PRN

## 2018-03-25 MED ORDER — SODIUM CHLORIDE 0.9 % IV SOLN
Freq: Once | INTRAVENOUS | Status: AC
  Administered 2018-03-25: 04:00:00 via INTRAVENOUS

## 2018-03-25 MED ORDER — FENTANYL CITRATE (PF) 100 MCG/2ML IJ SOLN
INTRAMUSCULAR | Status: DC | PRN
  Administered 2018-03-24: 50 ug via INTRAVENOUS
  Administered 2018-03-25 (×3): 25 ug via INTRAVENOUS
  Administered 2018-03-25: 50 ug via INTRAVENOUS

## 2018-03-25 MED ORDER — SUCCINYLCHOLINE CHLORIDE 200 MG/10ML IV SOSY
PREFILLED_SYRINGE | INTRAVENOUS | Status: AC
  Filled 2018-03-25: qty 10

## 2018-03-25 MED ORDER — PROPOFOL 500 MG/50ML IV EMUL
INTRAVENOUS | Status: DC | PRN
  Administered 2018-03-25: 30 ug/kg/min via INTRAVENOUS

## 2018-03-25 MED ORDER — SIMVASTATIN 20 MG PO TABS
20.0000 mg | ORAL_TABLET | Freq: Every evening | ORAL | Status: DC
  Administered 2018-03-25 – 2018-04-03 (×9): 20 mg
  Filled 2018-03-25 (×9): qty 1

## 2018-03-25 MED ORDER — FAMOTIDINE IN NACL 20-0.9 MG/50ML-% IV SOLN: 20.0000 mg | Freq: Two times a day (BID) | INTRAVENOUS | Status: DC

## 2018-03-25 MED ORDER — CEFAZOLIN SODIUM-DEXTROSE 1-4 GM/50ML-% IV SOLN
INTRAVENOUS | Status: DC | PRN
  Administered 2018-03-25: 1 g via INTRAVENOUS

## 2018-03-25 MED ORDER — ARTIFICIAL TEARS OPHTHALMIC OINT
TOPICAL_OINTMENT | OPHTHALMIC | Status: AC
  Filled 2018-03-25: qty 3.5

## 2018-03-25 MED ORDER — BISACODYL 10 MG RE SUPP: 10.0000 mg | Freq: Every day | RECTAL | Status: DC | PRN

## 2018-03-25 MED ORDER — ONDANSETRON HCL 4 MG PO TABS: 4.0000 mg | ORAL_TABLET | ORAL | Status: DC | PRN

## 2018-03-25 MED ORDER — ROCURONIUM BROMIDE 100 MG/10ML IV SOLN
INTRAVENOUS | Status: DC | PRN
  Administered 2018-03-24: 50 mg via INTRAVENOUS
  Administered 2018-03-25: 30 mg via INTRAVENOUS

## 2018-03-25 MED ORDER — LIDOCAINE HCL (CARDIAC) PF 100 MG/5ML IV SOSY
PREFILLED_SYRINGE | INTRAVENOUS | Status: DC | PRN
  Administered 2018-03-24: 100 mg via INTRAVENOUS

## 2018-03-25 NOTE — Transfer of Care (Signed)
Immediate Anesthesia Transfer of Care Note  Patient: Barry Miranda  Procedure(s) Performed: CRANIOTOMY HEMATOMA EVACUATION EPIDURAL (Right Head)  Patient Location: ICU  Anesthesia Type:General  Level of Consciousness: sedated and Patient remains intubated per anesthesia plan  Airway & Oxygen Therapy: Patient remains intubated per anesthesia plan and Patient placed on Ventilator (see vital sign flow sheet for setting)  Post-op Assessment: Report given to RN and Post -op Vital signs reviewed and stable  Post vital signs: Reviewed and stable  Last Vitals:  Vitals Value Taken Time  BP    Temp    Pulse    Resp    SpO2      Last Pain:  Vitals:   03/24/18 2000  TempSrc: Oral  PainSc:       Patients Stated Pain Goal: 2 (15/86/82 5749)  Complications: No apparent anesthesia complications

## 2018-03-25 NOTE — Progress Notes (Signed)
During second unit of FFP, pt temperature rose from 45F to 100.96F. Temp with unknown cause potentially d/t pt shivering, head bleed, or transfusion reaction. Blood transfusion stopped at 0600, tubing/saline/blood product to be returned to blood bank. MD notified. Will continue to monitor pt for further changes.

## 2018-03-25 NOTE — Consult Note (Signed)
PULMONARY / CRITICAL CARE MEDICINE   Name: Barry Miranda MRN: [id number] DOB: [birth year]    ADMISSION DATE:  03/23/2018 CONSULTATION DATE:  03/25/2018  REFERRING MD:  Dr. Vertell Limber   CHIEF COMPLAINT:  Intubated Post-Op  HISTORY OF PRESENT ILLNESS:   57 year old male with PMH of HLD  Presents to ED on 6/16 with headache with nausea/vomiting. Patient with left hemiparesis. CT head with large hemispheric SDH on the right. Taken to OR for craniotomy with evacuation. On 6/16 at 2030 patient with lethargy. Taken to CT which revealed large acute epidural hematoma under the bone flap with significant mass-effect . Taken back to OR for evacuation. Remained intubated in post-operative period. PCCM consulted.   PAST MEDICAL HISTORY :  He  has a past medical history of Hyperlipidemia.  PAST SURGICAL HISTORY: He  has a past surgical history that includes Lipoma excision; Wisdom tooth extraction; and Cholecystectomy (2012).  No Known Allergies  No current facility-administered medications on file prior to encounter.    Current Outpatient Medications on File Prior to Encounter  Medication Sig  . aspirin EC 81 MG tablet Take 81 mg by mouth daily.  . Cholecalciferol (VITAMIN D) 2000 units CAPS Take 2,000 Units by mouth daily.   . simvastatin (ZOCOR) 20 MG tablet Take 20 mg by mouth every evening.    FAMILY HISTORY:  His indicated that his mother is alive. He indicated that his father is deceased. He indicated that both of his sisters are alive. He indicated that all of his three brothers are alive.   SOCIAL HISTORY: He  reports that he has been smoking.  He has been smoking about 0.15 packs per day. He has never used smokeless tobacco. He reports that he drinks about 4.2 oz of alcohol per week. He reports that he does not use drugs.  REVIEW OF SYSTEMS:   Intubated/Sedated   SUBJECTIVE:   VITAL SIGNS: BP (!) 168/91 Comment: labetalol given  Pulse 89   Temp 97.9 F (36.6 C) (Oral)   Resp 13    Ht 5\' 4"  (1.626 m)   Wt 69.5 kg (153 lb 3.5 oz)   SpO2 97%   BMI 26.30 kg/m   HEMODYNAMICS:    VENTILATOR SETTINGS:    INTAKE / OUTPUT: I/O last 3 completed shifts: In: 4778.5 [P.O.:350; I.V.:2922.5; Blood:276; Other:25; IV [id number]:8341] Out: 9622 [Urine:3035; Drains:227; Blood:50]  PHYSICAL EXAMINATION: General:  Adult male, on vent Neuro:  Sedated, pupils 3 mm and sluggish, +gag/cough HEENT:  ETT in place, JP drain to right posterior  Cardiovascular:  RRR, no MRG  Lungs:  Clear breath sounds, no wheeze/crackles  Abdomen:  Active bowel sounds, non-distended  Musculoskeletal:  -edema  Skin:  Warm, dry   LABS:  BMET Recent Labs  Lab 03/24/18 0345 03/24/18 0638  NA 139 140  K 3.4* 3.0*  CL 105  --   CO2 21*  --   BUN 14  --   CREATININE 0.73  --   GLUCOSE 173*  --     Electrolytes Recent Labs  Lab 03/24/18 0345  CALCIUM 8.7*    CBC Recent Labs  Lab 03/24/18 0345 03/24/18 0638  WBC 19.2*  --   HGB 16.2 11.9*  HCT 46.0 35.0*  PLT 231  --     Coag's Recent Labs  Lab 03/24/18 0345  APTT 33  INR 0.86    Sepsis Markers No results for input(s): LATICACIDVEN, PROCALCITON, O2SATVEN in the last 168 hours.  ABG Recent Labs  Lab 03/24/18 0638  PHART 7.347*  PCO2ART 38.4  PO2ART 470.0*    Liver Enzymes Recent Labs  Lab 03/24/18 0345  AST 23  ALT 27  ALKPHOS 59  BILITOT 0.5  ALBUMIN 5.0    Cardiac Enzymes No results for input(s): TROPONINI, PROBNP in the last 168 hours.  Glucose No results for input(s): GLUCAP in the last 168 hours.  Imaging Ct Head Wo Contrast  Result Date: 03/24/2018 CLINICAL DATA:  57 y/o  M; change in mental status, lethargy. EXAM: CT HEAD WITHOUT CONTRAST TECHNIQUE: Contiguous axial images were obtained from the base of the skull through the vertex without intravenous contrast. COMPARISON:  03/24/2018 CT head. FINDINGS: Brain: Interval right lateral craniotomy and placement of extra-axial drain. The acute  extra-axial collection over the right cerebral convexity is now centered predominantly directly subjacent to the craniotomy flap with increased thickness to 15 mm, but decreased over the right apex and posterior convexity. Additionally, there is a small volume of intracranial pneumocephalus. Mass effect associated with the extra-axial collection partially effaces the right lateral ventricle and results to 10 mm of right-to-left midline shift with stable mild left ventricular entrapment. Additionally, there is partial effacement of the basilar cisterns. New small 15 mm acute parenchymal hemorrhage in the right posterior temporal lobe. Vascular: No hyperdense vessel or unexpected calcification. Skull: Interval right lateral craniotomy with postsurgical changes including foci of air and edema in the overlying scalp and skin staples. Sinuses/Orbits: No acute finding. Other: None. IMPRESSION: 1. Interval right lateral craniotomy, placement of extra-axial drain, partial evacuation of subdural hematoma. 2. Acute extra-axial hemorrhage over the right convexity is now concentrated subjacent to the craniotomy and increased in thickness when compared with the preoperative CT which may represent postoperative change and/or interval hemorrhage. 3. New acute 15 mm intra-axial hemorrhage in right posterior temporal lobe. 4. Mass effect with stable 10 mm right-to-left midline shift and mild left ventricular entrapment. These results will be called to the ordering clinician or representative by the Radiologist Assistant, and communication documented in the PACS or zVision Dashboard. Electronically Signed   By: Kristine Garbe M.D.   On: 03/24/2018 21:43   Ct Head Wo Contrast  Result Date: 03/24/2018 CLINICAL DATA:  Headache for 1 week, nausea and vomiting. Lethargy. History of hyperlipidemia. EXAM: CT HEAD WITHOUT CONTRAST TECHNIQUE: Contiguous axial images were obtained from the base of the skull through the vertex  without intravenous contrast. COMPARISON:  CT HEAD December 24, 2006 FINDINGS: BRAIN: Dense RIGHT 1 cm RIGHT holo hemispheric subdural hematoma resulting in 10 mm of RIGHT-to-LEFT midline shift. Trace falcotentorial subdural hematoma. LEFT lateral ventricle entrapment. No intraparenchymal hemorrhage. No acute large vascular territory infarct. LEFT inferior perivascular space again noted. Basal cistern effacement. VASCULAR: Unremarkable. SKULL/SOFT TISSUES: No skull fracture. No significant soft tissue swelling. ORBITS/SINUSES: The included ocular globes and orbital contents are normal.The mastoid aircells and included paranasal sinuses are well-aerated. OTHER: None. IMPRESSION: 1. Acute 1 cm RIGHT holo hemispheric subdural hematoma resulting 1 cm RIGHT-to-LEFT subfalcine herniation with LEFT lateral ventricle entrapment. 2. Trace acute falcotentorial subdural hematoma. 3. Critical Value/emergent results were called by telephone at the time of interpretation on 03/24/2018 at 3:11 am to [facility] , who verbally acknowledged these results. Electronically Signed   By: Elon Alas M.D.   On: 03/24/2018 03:12     STUDIES:  CT Head 6/16 > 1. Acute 1 cm RIGHT holo hemispheric subdural hematoma resulting 1 cm RIGHT-to-LEFT subfalcine herniation with LEFT lateral ventricle entrapment. 2. Trace  acute falcotentorial subdural hematoma. CT Head 6/16 > 1. Interval right lateral craniotomy, placement of extra-axial drain, partial evacuation of subdural hematoma. 2. Acute extra-axial hemorrhage over the right convexity is now concentrated subjacent to the craniotomy and increased in thickness when compared with the preoperative CT which may represent postoperative change and/or interval hemorrhage. 3. New acute 15 mm intra-axial hemorrhage in right posterior temporal lobe. 4. Mass effect with stable 10 mm right-to-left midline shift and mild left ventricular entrapment.   CULTURES: MRSA 6/16 > Negative   STaph 6/16 > Negative   ANTIBIOTICS: None.   SIGNIFICANT EVENTS: 6/16 > Presents to ED   LINES/TUBES: ETT 6/16 >>   DISCUSSION: 57 year old male presents to ED on 6/16 with Right SDH. Underwent evacuation. On 6/16 2030 patient with lethargy. CT Head with large acute epidural hematoma. Taken to OR for Craniotomy with evacuation.  ASSESSMENT / PLAN:  Respiratory Insufficieny in post-operative setting  Plan  -Vent Support -Trend ABG/CXR -Wean as able  -VAP Bundle  -Pulmonary Hygiene   HTN H/O HLD  Plan  -Cardiac Monitoring -Wean Cardene to maintain Systolic <389 -Continue Zocor   SUP Plan -NPO -PPI   Right SDH s/p Evacuation  Large Acute Epidural Hematoma s/p Evacuation and Craniotomy  Plan  -Per Neurosurgery  -RASS goal 0/-1  -Hold all anticoagulation  -Keppra BID  -Wean Propofol gtt to achieve RASS -PRN Fentanyl   FAMILY  - Updates: no family present   - Inter-disciplinary family meet or Palliative Care meeting due by:  04/01/2018    Hayden Pedro, AGACNP-BC [facility]  Pgr: [phone]  PCCM Pgr: [phone]

## 2018-03-25 NOTE — Anesthesia Postprocedure Evaluation (Signed)
Anesthesia Post Note  Patient: Barry Miranda  Procedure(s) Performed: CRANIOTOMY HEMATOMA EVACUATION EPIDURAL (Right Head)     Patient location during evaluation: ICU Anesthesia Type: General Level of consciousness: patient remains intubated per anesthesia plan Pain management: pain level controlled Vital Signs Assessment: post-procedure vital signs reviewed and stable Respiratory status: patient remains intubated per anesthesia plan Cardiovascular status: stable Anesthetic complications: no    Last Vitals:  Vitals:   03/24/18 1900 03/24/18 2000  BP: (!) 168/91   Pulse: 89   Resp: 13   Temp:  36.6 C  SpO2: 97%     Last Pain:  Vitals:   03/24/18 2000  TempSrc: Oral  PainSc:                  Jaleal Schliep

## 2018-03-25 NOTE — Progress Notes (Signed)
Subjective: Patient reports intubated  Objective: Vital signs in last 24 hours: Temp:  [97.7 F (36.5 C)-100.4 F (38 C)] 100.4 F (38 C) (06/17 0600) Pulse Rate:  [74-107] 94 (06/17 0700) Resp:  [0-18] 15 (06/17 0700) BP: (87-178)/(52-106) 118/67 (06/17 0700) SpO2:  [95 %-100 %] 100 % (06/17 0815) Arterial Line BP: (101-233)/(46-87) 172/58 (06/17 0700) FiO2 (%):  [40 %-60 %] 40 % (06/17 0815) Weight:  [69.5 kg (153 lb 3.5 oz)] 69.5 kg (153 lb 3.5 oz) (06/16 0900)  Intake/Output from previous day: 06/16 0701 - 06/17 0700 In: 3898.3 [P.O.:350; I.V.:2115.6; Blood:752.7; IV [id number]:027] Out: 2536 [Urine:3835; Emesis/NG output:800; Drains:277; Blood:250] Intake/Output this shift: No intake/output data recorded.  Physical Exam: Patient awakens, opens eyes, follows commands all four extremities, nods to questions.  Dressing CDI.  Lab Results: Recent Labs    03/24/18 0345 03/24/18 0638 03/25/18 0348  WBC 19.2*  --  15.0*  HGB 16.2 11.9* 11.6*  HCT 46.0 35.0* 34.0*  PLT 231  --  224   BMET Recent Labs    03/24/18 0345 03/24/18 0638 03/25/18 0348  NA 139 140 141  K 3.4* 3.0* 3.6  CL 105  --  110  CO2 21*  --  23  GLUCOSE 173*  --  147*  BUN 14  --  9  CREATININE 0.73  --  0.74  CALCIUM 8.7*  --  7.6*    Studies/Results: Ct Head Wo Contrast  Result Date: 03/25/2018 CLINICAL DATA:  Follow up intracranial hemorrhage. EXAM: CT HEAD WITHOUT CONTRAST TECHNIQUE: Contiguous axial images were obtained from the base of the skull through the vertex without intravenous contrast. COMPARISON:  CT HEAD March 24, 2018 FINDINGS: BRAIN: Interval removal of RIGHT extra-axial surgical drain. 12 mm residual RIGHT subdural hematoma was 15 mm. 5 mm RIGHT-to-LEFT midline shift, decreased from 10 mm. Partially re-expanded RIGHT lateral ventricle with minimal residual LEFT ventricle entrapment. Evolving RIGHT posterior temporal lobe 14 mm hematoma gray-white matter junction. 3 mm  falcotentorial subdural hematoma. Decreased extra-axial pneumocephalus. No acute large vascular territory infarcts. LEFT inferior basal ganglia perivascular space. Basal cistern is patent. VASCULAR: Unremarkable. SKULL/SOFT TISSUES: Status post RIGHT frontotemporal craniotomy. RIGHT scalp soft tissue swelling with subcutaneous gas and overlying skin staples and drain. ORBITS/SINUSES: The included ocular globes and orbital contents are normal.The mastoid aircells and included paranasal sinuses are well-aerated. OTHER: None. IMPRESSION: 1. Decreased 12 mm residual RIGHT subdural hematoma, interval removal of surgical drain. Similar small falcotentorial subdural hematoma. 2. 5 mm residual RIGHT-to-LEFT midline shift, decreased from 10 mm. Resolving LEFT ventricular entrapment. 3. Evolving 15 mm RIGHT temporal lobe hematoma. Electronically Signed   By: Elon Alas M.D.   On: 03/25/2018 05:54   Ct Head Wo Contrast  Result Date: 03/24/2018 CLINICAL DATA:  57 y/o  M; change in mental status, lethargy. EXAM: CT HEAD WITHOUT CONTRAST TECHNIQUE: Contiguous axial images were obtained from the base of the skull through the vertex without intravenous contrast. COMPARISON:  03/24/2018 CT head. FINDINGS: Brain: Interval right lateral craniotomy and placement of extra-axial drain. The acute extra-axial collection over the right cerebral convexity is now centered predominantly directly subjacent to the craniotomy flap with increased thickness to 15 mm, but decreased over the right apex and posterior convexity. Additionally, there is a small volume of intracranial pneumocephalus. Mass effect associated with the extra-axial collection partially effaces the right lateral ventricle and results to 10 mm of right-to-left midline shift with stable mild left ventricular entrapment. Additionally, there is partial effacement of  the basilar cisterns. New small 15 mm acute parenchymal hemorrhage in the right posterior temporal lobe.  Vascular: No hyperdense vessel or unexpected calcification. Skull: Interval right lateral craniotomy with postsurgical changes including foci of air and edema in the overlying scalp and skin staples. Sinuses/Orbits: No acute finding. Other: None. IMPRESSION: 1. Interval right lateral craniotomy, placement of extra-axial drain, partial evacuation of subdural hematoma. 2. Acute extra-axial hemorrhage over the right convexity is now concentrated subjacent to the craniotomy and increased in thickness when compared with the preoperative CT which may represent postoperative change and/or interval hemorrhage. 3. New acute 15 mm intra-axial hemorrhage in right posterior temporal lobe. 4. Mass effect with stable 10 mm right-to-left midline shift and mild left ventricular entrapment. These results will be called to the ordering clinician or representative by the Radiologist Assistant, and communication documented in the PACS or zVision Dashboard. Electronically Signed   By: Kristine Garbe M.D.   On: 03/24/2018 21:43   Ct Head Wo Contrast  Result Date: 03/24/2018 CLINICAL DATA:  Headache for 1 week, nausea and vomiting. Lethargy. History of hyperlipidemia. EXAM: CT HEAD WITHOUT CONTRAST TECHNIQUE: Contiguous axial images were obtained from the base of the skull through the vertex without intravenous contrast. COMPARISON:  CT HEAD December 24, 2006 FINDINGS: BRAIN: Dense RIGHT 1 cm RIGHT holo hemispheric subdural hematoma resulting in 10 mm of RIGHT-to-LEFT midline shift. Trace falcotentorial subdural hematoma. LEFT lateral ventricle entrapment. No intraparenchymal hemorrhage. No acute large vascular territory infarct. LEFT inferior perivascular space again noted. Basal cistern effacement. VASCULAR: Unremarkable. SKULL/SOFT TISSUES: No skull fracture. No significant soft tissue swelling. ORBITS/SINUSES: The included ocular globes and orbital contents are normal.The mastoid aircells and included paranasal sinuses are  well-aerated. OTHER: None. IMPRESSION: 1. Acute 1 cm RIGHT holo hemispheric subdural hematoma resulting 1 cm RIGHT-to-LEFT subfalcine herniation with LEFT lateral ventricle entrapment. 2. Trace acute falcotentorial subdural hematoma. 3. Critical Value/emergent results were called by telephone at the time of interpretation on 03/24/2018 at 3:11 am to [facility] , who verbally acknowledged these results. Electronically Signed   By: Elon Alas M.D.   On: 03/24/2018 03:12   Dg Chest Port 1 View  Result Date: 03/25/2018 CLINICAL DATA:  ET and OG tube placement EXAM: PORTABLE CHEST 1 VIEW COMPARISON:  11/29/2011 FINDINGS: Endotracheal tube tip is about 2.8 cm superior to the carina. Esophageal tube tip is below the diaphragm but non included. Surgical clips in the right upper quadrant. Linear scarring or atelectasis at the left base. Right lung is clear. Heart size within normal limits. Aortic atherosclerosis. No pneumothorax. IMPRESSION: 1. Endotracheal tube tip about 2.8 cm superior to carina 2. Esophageal tube tip below the diaphragm but non included 3. Linear scarring or atelectasis at the left base Electronically Signed   By: Donavan Foil M.D.   On: 03/25/2018 02:37    Assessment/Plan: Patient is improving after removal of SDH, then EDH.  Wean ventilator.  Observe in ICU.  Mobilize as tolerated and advance diet.    LOS: 1 day    Peggyann Shoals, MD 03/25/2018, 8:29 AM

## 2018-03-25 NOTE — Op Note (Signed)
Date of surgery: 03/25/2018 Preoperative diagnosis: Postoperative epidural hematoma right frontotemporoparietal. Postoperative diagnosis: Same Procedure: Right frontal temporal craniotomy and evacuation of postoperative epidural hematoma Surgeon: Kristeen Miss Anesthesia: General endotracheal Indications: Barry Miranda is a 57 year old individual who had a subdural hematoma removed yesterday.  Postoperatively he complained bitterly of pain then around 7:00 vomited 700 cc and complained further of better pain despite being substantially sedated with IV morphine.  He would follow commands and would arouse but he did require some substantial arousable.  CT was performed which demonstrated presence of the acute epidural hematoma in the region of his previous subdural hematoma.  He is advised regarding the need for surgery and is taken to the operating room now.  Procedure: Patient was brought to the operating room supine on the stretcher after being placed on the operating table and having general endotracheal anesthesia induced his head was placed in a dominant and was turned to the left side.  The bandage was removed and his sub-dural drain was removed.  Then the surgical staples were removed from the scalp and the scalp was prepped with alcohol and DuraPrep as best possible.  There is substantial bleeding from the incision itself.  With this after draping the incision was opened using Metzenbaum scissors to release the galeal sutures.  The fascia was also sutured and this was released and the fascia was brought forward.  The plates were removed from the cranial side of their attachment and the bone flap was elevated.  There were no tack up sutures in the bone flap.  Immediately was encountered a very dense thick epidural hematoma.  This was removed in a piecemeal fashion by scraping the dura.  There is noted to be a dural patch in the inferior portion of the opening.  This was remained intact.  Most of the  blood was over this dural patch.  Once the epidural was removed the brain surface was noted to be pulsatile with the dura intact.  I then placed 3 separate tack up sutures in the middle of the opening and brought these through the bone flap.  The bone flap was then resecured to the edges.  Closure was performed after adequate hemostasis in the galea could be obtained galea was noted to be quite hemorrhagic.  There was concern that the patient had a bleeding issue he had been given some platelets earlier in the day for the first operation we gave him factor VII products in addition to 2 units of fresh frozen plasma at the end of the case bleeding seemed to be well controlled.  He will remain intubated postoperatively to be awakened in the morning critical care management will help with his management.

## 2018-03-25 NOTE — Progress Notes (Addendum)
PULMONARY / CRITICAL CARE MEDICINE   Name: Barry Miranda MRN: [id number] DOB: [birth year]    ADMISSION DATE:  03/23/2018 CONSULTATION DATE:  03/25/2018  REFERRING MD:  Dr. Vertell Limber   CHIEF COMPLAINT:  Intubated Post-Op  HISTORY OF PRESENT ILLNESS:   57 year old male with PMH of HLD  Presents to ED on 6/16 with headache with nausea/vomiting. Patient with left hemiparesis. CT head with large hemispheric SDH on the right. Taken to OR for craniotomy with evacuation. On 6/16 at 2030 patient with lethargy. Taken to CT which revealed large acute epidural hematoma under the bone flap with significant mass-effect . Taken back to OR for evacuation. Remained intubated in post-operative period. PCCM consulted.    SUBJECTIVE:  Weaning at PS 5/5 this AM and tolerating well but mental status barrier for extubation attempt. CT head improved but remains encephalopathic.  ADDENDUM 1106 AM:  Pt awake now and following most commands, pulling at ETT.  VITAL SIGNS: BP 139/70   Pulse (!) 101   Temp (!) 101 F (38.3 C) (Axillary) Comment: tylenol given  Resp 11   Ht 5\' 4"  (1.626 m)   Wt 69.5 kg (153 lb 3.5 oz)   SpO2 100%   BMI 26.30 kg/m   HEMODYNAMICS:    VENTILATOR SETTINGS: Vent Mode: PSV;CPAP FiO2 (%):  [40 %-60 %] 40 % Set Rate:  [16 bmp] 16 bmp Vt Set:  [470 mL] 470 mL PEEP:  [5 cmH20] 5 cmH20 Pressure Support:  [5 cmH20] 5 cmH20 Plateau Pressure:  [10 cmH20-13 cmH20] 10 cmH20  INTAKE / OUTPUT: I/O last 3 completed shifts: In: 29.3 [P.O.:350; I.V.:4015.6; Blood:752.7; Other:25; IV [id number]] Out: 4259 [Urine:4410; Emesis/NG output:950; Drains:277; Blood:250]  PHYSICAL EXAMINATION: General:  Adult male, in NAD Neuro:  Sedated, not responsive HEENT:  ETT in place, Head dressings C/D/I, JP drain to right posterior  Cardiovascular:  RRR, no MRG  Lungs:  CTAB Abdomen:  Active bowel sounds, non-distended  Musculoskeletal:  -edema  Skin:  Warm, dry   LABS:  BMET Recent Labs   Lab 03/24/18 0345 03/24/18 0638 03/25/18 0348  NA 139 140 141  K 3.4* 3.0* 3.6  CL 105  --  110  CO2 21*  --  23  BUN 14  --  9  CREATININE 0.73  --  0.74  GLUCOSE 173*  --  147*    Electrolytes Recent Labs  Lab 03/24/18 0345 03/25/18 0348  CALCIUM 8.7* 7.6*  MG  --  1.9  PHOS  --  3.2    CBC Recent Labs  Lab 03/24/18 0345 03/24/18 0638 03/25/18 0348  WBC 19.2*  --  15.0*  HGB 16.2 11.9* 11.6*  HCT 46.0 35.0* 34.0*  PLT 231  --  224    Coag's Recent Labs  Lab 03/24/18 0345  APTT 33  INR 0.86    Sepsis Markers No results for input(s): LATICACIDVEN, PROCALCITON, O2SATVEN in the last 168 hours.  ABG Recent Labs  Lab 03/24/18 0638 03/25/18 0155  PHART 7.347* 7.370  PCO2ART 38.4 41.4  PO2ART 470.0* 218*    Liver Enzymes Recent Labs  Lab 03/24/18 0345  AST 23  ALT 27  ALKPHOS 59  BILITOT 0.5  ALBUMIN 5.0    Cardiac Enzymes No results for input(s): TROPONINI, PROBNP in the last 168 hours.  Glucose No results for input(s): GLUCAP in the last 168 hours.  Imaging Ct Head Wo Contrast  Result Date: 03/25/2018 CLINICAL DATA:  Follow up intracranial hemorrhage. EXAM: CT HEAD WITHOUT  CONTRAST TECHNIQUE: Contiguous axial images were obtained from the base of the skull through the vertex without intravenous contrast. COMPARISON:  CT HEAD March 24, 2018 FINDINGS: BRAIN: Interval removal of RIGHT extra-axial surgical drain. 12 mm residual RIGHT subdural hematoma was 15 mm. 5 mm RIGHT-to-LEFT midline shift, decreased from 10 mm. Partially re-expanded RIGHT lateral ventricle with minimal residual LEFT ventricle entrapment. Evolving RIGHT posterior temporal lobe 14 mm hematoma gray-white matter junction. 3 mm falcotentorial subdural hematoma. Decreased extra-axial pneumocephalus. No acute large vascular territory infarcts. LEFT inferior basal ganglia perivascular space. Basal cistern is patent. VASCULAR: Unremarkable. SKULL/SOFT TISSUES: Status post RIGHT  frontotemporal craniotomy. RIGHT scalp soft tissue swelling with subcutaneous gas and overlying skin staples and drain. ORBITS/SINUSES: The included ocular globes and orbital contents are normal.The mastoid aircells and included paranasal sinuses are well-aerated. OTHER: None. IMPRESSION: 1. Decreased 12 mm residual RIGHT subdural hematoma, interval removal of surgical drain. Similar small falcotentorial subdural hematoma. 2. 5 mm residual RIGHT-to-LEFT midline shift, decreased from 10 mm. Resolving LEFT ventricular entrapment. 3. Evolving 15 mm RIGHT temporal lobe hematoma. Electronically Signed   By: Elon Alas M.D.   On: 03/25/2018 05:54   Ct Head Wo Contrast  Result Date: 03/24/2018 CLINICAL DATA:  57 y/o  M; change in mental status, lethargy. EXAM: CT HEAD WITHOUT CONTRAST TECHNIQUE: Contiguous axial images were obtained from the base of the skull through the vertex without intravenous contrast. COMPARISON:  03/24/2018 CT head. FINDINGS: Brain: Interval right lateral craniotomy and placement of extra-axial drain. The acute extra-axial collection over the right cerebral convexity is now centered predominantly directly subjacent to the craniotomy flap with increased thickness to 15 mm, but decreased over the right apex and posterior convexity. Additionally, there is a small volume of intracranial pneumocephalus. Mass effect associated with the extra-axial collection partially effaces the right lateral ventricle and results to 10 mm of right-to-left midline shift with stable mild left ventricular entrapment. Additionally, there is partial effacement of the basilar cisterns. New small 15 mm acute parenchymal hemorrhage in the right posterior temporal lobe. Vascular: No hyperdense vessel or unexpected calcification. Skull: Interval right lateral craniotomy with postsurgical changes including foci of air and edema in the overlying scalp and skin staples. Sinuses/Orbits: No acute finding. Other: None.  IMPRESSION: 1. Interval right lateral craniotomy, placement of extra-axial drain, partial evacuation of subdural hematoma. 2. Acute extra-axial hemorrhage over the right convexity is now concentrated subjacent to the craniotomy and increased in thickness when compared with the preoperative CT which may represent postoperative change and/or interval hemorrhage. 3. New acute 15 mm intra-axial hemorrhage in right posterior temporal lobe. 4. Mass effect with stable 10 mm right-to-left midline shift and mild left ventricular entrapment. These results will be called to the ordering clinician or representative by the Radiologist Assistant, and communication documented in the PACS or zVision Dashboard. Electronically Signed   By: Kristine Garbe M.D.   On: 03/24/2018 21:43   Dg Chest Port 1 View  Result Date: 03/25/2018 CLINICAL DATA:  ET and OG tube placement EXAM: PORTABLE CHEST 1 VIEW COMPARISON:  11/29/2011 FINDINGS: Endotracheal tube tip is about 2.8 cm superior to the carina. Esophageal tube tip is below the diaphragm but non included. Surgical clips in the right upper quadrant. Linear scarring or atelectasis at the left base. Right lung is clear. Heart size within normal limits. Aortic atherosclerosis. No pneumothorax. IMPRESSION: 1. Endotracheal tube tip about 2.8 cm superior to carina 2. Esophageal tube tip below the diaphragm but non included 3.  Linear scarring or atelectasis at the left base Electronically Signed   By: Donavan Foil M.D.   On: 03/25/2018 02:37     STUDIES:  CT Head 6/16 > 1. Acute 1 cm RIGHT holo hemispheric subdural hematoma resulting 1 cm RIGHT-to-LEFT subfalcine herniation with LEFT lateral ventricle entrapment. 2. Trace acute falcotentorial subdural hematoma. CT Head 6/16 > 1. Interval right lateral craniotomy, placement of extra-axial drain, partial evacuation of subdural hematoma. 2. Acute extra-axial hemorrhage over the right convexity is now concentrated subjacent  to the craniotomy and increased in thickness when compared with the preoperative CT which may represent postoperative change and/or interval hemorrhage. 3. New acute 15 mm intra-axial hemorrhage in right posterior temporal lobe. 4. Mass effect with stable 10 mm right-to-left midline shift and mild left ventricular entrapment. CT head 6/17 > decreased right SDH with similar small falcotentorial SDH.  6mm residual R to L MLS which is decreased from 85mm, resolving left ventricular entrapment, evolving 27mm right temporal lobe hematoma.   CULTURES: MRSA 6/16 > Negative  STaph 6/16 > Negative   ANTIBIOTICS: None.   SIGNIFICANT EVENTS: 6/16 > Presents to ED   LINES/TUBES: ETT 6/16 >>   DISCUSSION: 58 year old male presents to ED on 6/16 with Right SDH. Underwent evacuation. On 6/16 2030 patient with lethargy. CT Head with large acute epidural hematoma. Taken to OR for Craniotomy with evacuation.  ASSESSMENT / PLAN:  Respiratory Insufficieny in post-operative setting  Plan  Continue SBT as tolerated, mental status currently restricts any extubation consideration VAP Bundle  Pulmonary Hygiene  Follow CXR  HTN H/O HLD  Plan  Cardiac Monitoring Continue Cardene PRN to maintain Systolic <295 Continue Zocor   SUP Plan NPO Will start TF's given no extubation plans due to mental status PPI   Right SDH s/p Evacuation  Large Acute Epidural Hematoma s/p Evacuation and Craniotomy  Plan  Per Neurosurgery  Sedation: Propofol gtt / Fentanyl PRN RASS goal 0/-1  Hold all anticoagulation  Keppra BID    ADDENDUM 1106 AM:   Pt more awake now, following commands.  Vitals stable, good volumes on PS 5/5.  RSBI good. Will proceed with extubation.   FAMILY  - Updates: Wife updated at bedside.  - Inter-disciplinary family meet or Palliative Care meeting due by:  04/01/2018    CC time: 30 min.   Montey Hora, [facility] Pager: [phone]  or [phone] 03/25/2018, 10:21 AM

## 2018-03-25 NOTE — Procedures (Signed)
Extubation Procedure Note  Patient Details:   Name: Barry Miranda DOB: 1961/01/04 MRN: [id number]   Airway Documentation:    Vent end date: 03/25/18 Vent end time: 1145   Evaluation  O2 sats: stable throughout Complications: No apparent complications Patient did tolerate procedure well. Bilateral Breath Sounds: Clear   Yes, SPO2 99% on RA  Gonzella Lex 03/25/2018, 11:51 AM

## 2018-03-25 NOTE — Progress Notes (Signed)
Pt lethargic, requiring repeated stimulation at 1930 during shift report. Shortly after, pt vomited 700cc and complained of 10/10 head pain. Pt continuing to follow commands and is oriented x4 but is now requiring increased stimulus to arouse. BP unmanaged with IV labetalol. Dr Ellene Route paged regarding pt status. Orders received for IV Nicardipene, STAT Head CT, and for pt to remain NPO.    STAT Head CT showing acute epidural bleed at site of subdural hematoma evacuation. Dr Ellene Route to consent with family and take pt back to OR. BP now controlled with IV Nicardipene. Pt continuing to be very lethargic. Will continue to monitor pt.

## 2018-03-26 ENCOUNTER — Inpatient Hospital Stay (HOSPITAL_COMMUNITY): Payer: 59

## 2018-03-26 LAB — BPAM FFP
Blood Product Expiration Date: 201906222359
Blood Product Expiration Date: 201906222359
ISSUE DATE / TIME: 201906170325
ISSUE DATE / TIME: 201906170325
UNIT TYPE AND RH: 5100
Unit Type and Rh: 5100

## 2018-03-26 LAB — PREPARE FRESH FROZEN PLASMA
UNIT DIVISION: 0
Unit division: 0

## 2018-03-26 LAB — BASIC METABOLIC PANEL
ANION GAP: 7 (ref 5–15)
BUN: 6 mg/dL (ref 6–20)
CALCIUM: 8.3 mg/dL — AB (ref 8.9–10.3)
CO2: 25 mmol/L (ref 22–32)
Chloride: 110 mmol/L (ref 101–111)
Creatinine, Ser: 0.68 mg/dL (ref 0.61–1.24)
Glucose, Bld: 125 mg/dL — ABNORMAL HIGH (ref 65–99)
POTASSIUM: 3.4 mmol/L — AB (ref 3.5–5.1)
Sodium: 142 mmol/L (ref 135–145)

## 2018-03-26 LAB — CBC
HEMATOCRIT: 33 % — AB (ref 39.0–52.0)
HEMOGLOBIN: 11.3 g/dL — AB (ref 13.0–17.0)
MCH: 31.1 pg (ref 26.0–34.0)
MCHC: 34.2 g/dL (ref 30.0–36.0)
MCV: 90.9 fL (ref 78.0–100.0)
Platelets: 183 10*3/uL (ref 150–400)
RBC: 3.63 MIL/uL — ABNORMAL LOW (ref 4.22–5.81)
RDW: 13.5 % (ref 11.5–15.5)
WBC: 9.8 10*3/uL (ref 4.0–10.5)

## 2018-03-26 LAB — MAGNESIUM: Magnesium: 2.4 mg/dL (ref 1.7–2.4)

## 2018-03-26 LAB — TRANSFUSION REACTION
DAT C3: NEGATIVE
Post RXN DAT IgG: NEGATIVE

## 2018-03-26 LAB — PHOSPHORUS: PHOSPHORUS: 1.9 mg/dL — AB (ref 2.5–4.6)

## 2018-03-26 MED ORDER — HYDRALAZINE HCL 20 MG/ML IJ SOLN
10.0000 mg | Freq: Four times a day (QID) | INTRAMUSCULAR | Status: DC | PRN
  Administered 2018-03-26 – 2018-03-28 (×4): 20 mg via INTRAVENOUS
  Administered 2018-03-29: 10 mg via INTRAVENOUS
  Administered 2018-03-29 – 2018-04-03 (×7): 20 mg via INTRAVENOUS
  Filled 2018-03-26 (×12): qty 1

## 2018-03-26 MED ORDER — MORPHINE SULFATE (PF) 2 MG/ML IV SOLN
2.0000 mg | INTRAVENOUS | Status: DC | PRN
  Administered 2018-03-26 – 2018-03-28 (×4): 2 mg via INTRAVENOUS
  Filled 2018-03-26 (×5): qty 1

## 2018-03-26 MED ORDER — HYDROCODONE-ACETAMINOPHEN 5-325 MG PO TABS
1.0000 | ORAL_TABLET | ORAL | Status: DC | PRN
  Administered 2018-03-26: 2 via ORAL
  Administered 2018-03-26 – 2018-03-28 (×7): 1 via ORAL
  Administered 2018-03-28: 2 via ORAL
  Filled 2018-03-26 (×2): qty 1
  Filled 2018-03-26: qty 2
  Filled 2018-03-26 (×2): qty 1
  Filled 2018-03-26: qty 2
  Filled 2018-03-26 (×3): qty 1

## 2018-03-26 MED ORDER — MORPHINE SULFATE (PF) 2 MG/ML IV SOLN
1.0000 mg | Freq: Four times a day (QID) | INTRAVENOUS | Status: DC | PRN
  Administered 2018-03-26: 2 mg via INTRAVENOUS
  Filled 2018-03-26: qty 1

## 2018-03-26 MED ORDER — DEXAMETHASONE SODIUM PHOSPHATE 10 MG/ML IJ SOLN
10.0000 mg | Freq: Once | INTRAMUSCULAR | Status: AC
  Administered 2018-03-26: 10 mg via INTRAVENOUS
  Filled 2018-03-26: qty 1

## 2018-03-26 NOTE — Evaluation (Signed)
Physical Therapy Evaluation Patient Details Name: Barry Miranda MRN: [id number] DOB: 1961/01/17 Today's Date: 03/26/2018   History of Present Illness   57 year old male with history of hyperlipidemia, presenting to the ED with headache.  CT showing large R SDH in much of the frontotemporoparietal regions.  Pt s/p crani and drain placement.  Clinical Impression  Pt admitted with/for SDH with the biggest problem being pain, lethargy and unsteadiness.  Pt currently limited functionally due to the problems listed below.  (see problems list.)  Pt will benefit from PT to maximize function and safety to be able to get home safely with available assist.     Follow Up Recommendations Outpatient PT;Supervision/Assistance - 24 hour    Equipment Recommendations  None recommended by PT    Recommendations for Other Services       Precautions / Restrictions Precautions Precautions: Fall      Mobility  Bed Mobility Overal bed mobility: Needs Assistance Bed Mobility: Supine to Sit;Sit to Supine     Supine to sit: Min guard Sit to supine: Min guard   General bed mobility comments: slow and guarded egress/ingress  Transfers Overall transfer level: Needs assistance   Transfers: Sit to/from Stand Sit to Stand: Min assist         General transfer comment: stability/safety  Ambulation/Gait Ambulation/Gait assistance: Min assist Gait Distance (Feet): 15 Feet(x2 to/from toilet)   Gait Pattern/deviations: Step-through pattern Gait velocity: slower Gait velocity interpretation: <1.31 ft/sec, indicative of household ambulator General Gait Details: mildly unsteady and guarded.  Stairs            Wheelchair Mobility    Modified Rankin (Stroke Patients Only) Modified Rankin (Stroke Patients Only) Pre-Morbid Rankin Score: No symptoms Modified Rankin: Moderately severe disability     Balance Overall balance assessment: Needs assistance Sitting-balance support: No upper  extremity supported;Single extremity supported Sitting balance-Leahy Scale: Fair     Standing balance support: Single extremity supported;No upper extremity supported Standing balance-Leahy Scale: Fair                               Pertinent Vitals/Pain Pain Assessment: 0-10 Pain Score: 7  Pain Location: heads Pain Descriptors / Indicators: Aching Pain Intervention(s): Monitored during session;Premedicated before session    Home Living Family/patient expects to be discharged to:: Private residence Living Arrangements: Spouse/significant other;Children Available Help at Discharge: Family;Available 24 hours/day Type of Home: House Home Access: Level entry     Home Layout: Two level;Bed/bath upstairs Home Equipment: None      Prior Function Level of Independence: Independent         Comments: Teacher, music Dominance        Extremity/Trunk Assessment   Upper Extremity Assessment Upper Extremity Assessment: Defer to OT evaluation    Lower Extremity Assessment Lower Extremity Assessment: Generalized weakness(surprisingly right tested weaker than left, pt lethargic)       Communication   Communication: No difficulties  Cognition Arousal/Alertness: Awake/alert;Lethargic Behavior During Therapy: WFL for tasks assessed/performed Overall Cognitive Status: Within Functional Limits for tasks assessed(though did not test formally, he followed basic commands)                                        General Comments      Exercises     Assessment/Plan  PT Assessment Patient needs continued PT services  PT Problem List Decreased strength;Decreased activity tolerance;Decreased mobility;Pain       PT Treatment Interventions Gait training;Stair training;Functional mobility training;Therapeutic activities;Balance training;Patient/family education    PT Goals (Current goals can be found in the Care Plan section)  Acute  Rehab PT Goals Patient Stated Goal: Back to work PT Goal Formulation: With patient/family Time For Goal Achievement: 04/02/18 Potential to Achieve Goals: Good    Frequency Min 3X/week   Barriers to discharge        Co-evaluation               AM-PAC PT "6 Clicks" Daily Activity  Outcome Measure Difficulty turning over in bed (including adjusting bedclothes, sheets and blankets)?: A Lot Difficulty moving from lying on back to sitting on the side of the bed? : A Lot Difficulty sitting down on and standing up from a chair with arms (e.g., wheelchair, bedside commode, etc,.)?: Unable Help needed moving to and from a bed to chair (including a wheelchair)?: A Little Help needed walking in hospital room?: A Little Help needed climbing 3-5 steps with a railing? : A Little 6 Click Score: 14    End of Session   Activity Tolerance: Patient tolerated treatment well;Other (comment)(limited by HA, nausea) Patient left: in bed;with call bell/phone within reach;with family/visitor present Nurse Communication: Mobility status PT Visit Diagnosis: Unsteadiness on feet (R26.81);Pain;Difficulty in walking, not elsewhere classified (R26.2) Pain - part of body: (head)    Time: 9323-5573 PT Time Calculation (min) (ACUTE ONLY): 30 min   Charges:   PT Evaluation $PT Eval Moderate Complexity: 1 Mod     PT G Codes:        09-Apr-2018  Donnella Sham, PT [phone]  (pager)  [name] 2018/04/09, 9:13 PM

## 2018-03-26 NOTE — Progress Notes (Addendum)
Subjective: Patient reports "My head hurts some...when I cough"  Objective: Vital signs in last 24 hours: Temp:  [98 F (36.7 C)-101.5 F (38.6 C)] 98.3 F (36.8 C) (06/18 0800) Pulse Rate:  [67-95] 81 (06/18 1200) Resp:  [11-24] 13 (06/18 1200) BP: (111-173)/(62-99) 140/72 (06/18 1200) SpO2:  [93 %-100 %] 99 % (06/18 1200)  Intake/Output from previous day: 06/17 0701 - 06/18 0700 In: 2345.9 [I.V.:1995.9; IV Piggyback:350] Out: 2230 [Urine:2175; Drains:55] Intake/Output this shift: Total I/O In: 438.7 [I.V.:388.7; IV Piggyback:50] Out: 400 [Urine:400]  Awake, conversant. Follows commands all extremities, aswering questions briskly and appropriately. No drift. Right JP patent with small amt bloody drainage.   Lab Results: Recent Labs    03/25/18 0348 03/26/18 0332  WBC 15.0* 9.8  HGB 11.6* 11.3*  HCT 34.0* 33.0*  PLT 224 183   BMET Recent Labs    03/25/18 0348 03/26/18 0332  NA 141 142  K 3.6 3.4*  CL 110 110  CO2 23 25  GLUCOSE 147* 125*  BUN 9 6  CREATININE 0.74 0.68  CALCIUM 7.6* 8.3*    Studies/Results: Ct Head Wo Contrast  Result Date: 03/25/2018 CLINICAL DATA:  Follow up intracranial hemorrhage. EXAM: CT HEAD WITHOUT CONTRAST TECHNIQUE: Contiguous axial images were obtained from the base of the skull through the vertex without intravenous contrast. COMPARISON:  CT HEAD March 24, 2018 FINDINGS: BRAIN: Interval removal of RIGHT extra-axial surgical drain. 12 mm residual RIGHT subdural hematoma was 15 mm. 5 mm RIGHT-to-LEFT midline shift, decreased from 10 mm. Partially re-expanded RIGHT lateral ventricle with minimal residual LEFT ventricle entrapment. Evolving RIGHT posterior temporal lobe 14 mm hematoma gray-white matter junction. 3 mm falcotentorial subdural hematoma. Decreased extra-axial pneumocephalus. No acute large vascular territory infarcts. LEFT inferior basal ganglia perivascular space. Basal cistern is patent. VASCULAR: Unremarkable. SKULL/SOFT  TISSUES: Status post RIGHT frontotemporal craniotomy. RIGHT scalp soft tissue swelling with subcutaneous gas and overlying skin staples and drain. ORBITS/SINUSES: The included ocular globes and orbital contents are normal.The mastoid aircells and included paranasal sinuses are well-aerated. OTHER: None. IMPRESSION: 1. Decreased 12 mm residual RIGHT subdural hematoma, interval removal of surgical drain. Similar small falcotentorial subdural hematoma. 2. 5 mm residual RIGHT-to-LEFT midline shift, decreased from 10 mm. Resolving LEFT ventricular entrapment. 3. Evolving 15 mm RIGHT temporal lobe hematoma. Electronically Signed   By: Elon Alas M.D.   On: 03/25/2018 05:54   Ct Head Wo Contrast  Result Date: 03/24/2018 CLINICAL DATA:  57 y/o  M; change in mental status, lethargy. EXAM: CT HEAD WITHOUT CONTRAST TECHNIQUE: Contiguous axial images were obtained from the base of the skull through the vertex without intravenous contrast. COMPARISON:  03/24/2018 CT head. FINDINGS: Brain: Interval right lateral craniotomy and placement of extra-axial drain. The acute extra-axial collection over the right cerebral convexity is now centered predominantly directly subjacent to the craniotomy flap with increased thickness to 15 mm, but decreased over the right apex and posterior convexity. Additionally, there is a small volume of intracranial pneumocephalus. Mass effect associated with the extra-axial collection partially effaces the right lateral ventricle and results to 10 mm of right-to-left midline shift with stable mild left ventricular entrapment. Additionally, there is partial effacement of the basilar cisterns. New small 15 mm acute parenchymal hemorrhage in the right posterior temporal lobe. Vascular: No hyperdense vessel or unexpected calcification. Skull: Interval right lateral craniotomy with postsurgical changes including foci of air and edema in the overlying scalp and skin staples. Sinuses/Orbits: No acute  finding. Other: None. IMPRESSION: 1. Interval  right lateral craniotomy, placement of extra-axial drain, partial evacuation of subdural hematoma. 2. Acute extra-axial hemorrhage over the right convexity is now concentrated subjacent to the craniotomy and increased in thickness when compared with the preoperative CT which may represent postoperative change and/or interval hemorrhage. 3. New acute 15 mm intra-axial hemorrhage in right posterior temporal lobe. 4. Mass effect with stable 10 mm right-to-left midline shift and mild left ventricular entrapment. These results will be called to the ordering clinician or representative by the Radiologist Assistant, and communication documented in the PACS or zVision Dashboard. Electronically Signed   By: Kristine Garbe M.D.   On: 03/24/2018 21:43   Dg Chest Port 1 View  Result Date: 03/25/2018 CLINICAL DATA:  ET and OG tube placement EXAM: PORTABLE CHEST 1 VIEW COMPARISON:  11/29/2011 FINDINGS: Endotracheal tube tip is about 2.8 cm superior to the carina. Esophageal tube tip is below the diaphragm but non included. Surgical clips in the right upper quadrant. Linear scarring or atelectasis at the left base. Right lung is clear. Heart size within normal limits. Aortic atherosclerosis. No pneumothorax. IMPRESSION: 1. Endotracheal tube tip about 2.8 cm superior to carina 2. Esophageal tube tip below the diaphragm but non included 3. Linear scarring or atelectasis at the left base Electronically Signed   By: Donavan Foil M.D.   On: 03/25/2018 02:37    Assessment/Plan: improving  LOS: 2 days  Continue supportive care, advancing diet and mobilizing as tolerated. Will leave JP today per Dr. Vertell Limber.    Verdis Prime 03/26/2018, 1:23 PM   Patient is doing well.  He is making good progress.  Likely remove drain in AM and mobilize.

## 2018-03-26 NOTE — Progress Notes (Signed)
Patient ID: ROI JAFARI, male   DOB: [birth year], 57 y.o.   MRN: [id number] Mr. Pridgen is lethargic, still following commands. Pupils round, regular, reactive to light Able to name daughter, his name, place Moving extremities Head ct shows slightly more edema, tighter basilar cisterns.  Will watch closely

## 2018-03-27 MED ORDER — SODIUM CHLORIDE 0.9 % IV SOLN
INTRAVENOUS | Status: DC | PRN
  Administered 2018-03-27: 09:00:00 via INTRAVENOUS

## 2018-03-27 NOTE — Progress Notes (Signed)
Occupational Therapy Evaluation:  Pt presents to OT with the below listed diagnosis and deficits.  Eval was limited due to severe HA 7/10.  Despite this, he was motivated to attempt activity.   He demonstrates decreased activity tolerance, and generalized weakness, as well as mild balance deficits.  He denies visual changes, but did not formally assess due to HA.   Cognition intact for basic tasks, but will benefit from further assessment.   He requires min A - max A for HA - mostly limited due to pain.   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    Recommend OPOT as he will need to be fully independent to return to work.    Will follow acutely.  Wife is very supportive.  No DME recommended  OPOT recommended.     03/26/18 1652  OT Visit Information  Last OT Received On 03/26/18  Assistance Needed +1  PT/OT/SLP Co-Evaluation/Treatment Yes  Reason for Co-Treatment For patient/therapist safety  PT goals addressed during session Mobility/safety with mobility  OT goals addressed during session ADL's and self-care  History of Present Illness  57 year old male with history of hyperlipidemia, presenting to the ED with headache.  CT showing large R SDH in much of the frontotemporoparietal regions.  Pt s/p crani and drain placement.  Precautions  Precautions Fall  Home Living  Family/patient expects to be discharged to: Private residence  Living Arrangements Spouse/significant other;Children  Available Help at Discharge Family;Available 24 hours/day  Type of Home House  Home Access Level entry  Home Layout Two level;Bed/bath upstairs  Alternate Level Stairs-Number of Steps flight  Alternate Level Stairs-Rails Right;Left  Bathroom Shower/Tub Walk-in Engineer, materials None  Prior Function  Level of Independence Independent  Comments works as Secondary school teacher No difficulties  Pain Assessment  Pain Assessment 0-10   Pain Score 7  Pain Location heads  Pain Descriptors / Indicators Aching  Pain Intervention(s) Monitored during session;Limited activity within patient's tolerance  Cognition  Arousal/Alertness Awake/alert;Lethargic  Behavior During Therapy WFL for tasks assessed/performed  Overall Cognitive Status Within Functional Limits for tasks assessed [facility] for basic cognition.  will benefit from further testing )  Upper Extremity Assessment  Upper Extremity Assessment Generalized weakness  Lower Extremity Assessment  Lower Extremity Assessment Generalized weakness  Cervical / Trunk Assessment  Cervical / Trunk Assessment Normal  ADL  Overall ADL's  Needs assistance/impaired  Eating/Feeding Independent  Grooming Wash/dry hands;Wash/dry face;Oral care;Minimal assistance;Standing  Upper Body Bathing Minimal assistance;Sitting  Lower Body Bathing Moderate assistance;Sit to/from stand  Lower Body Bathing Details (indicate cue type and reason) limited by HA   Upper Body Dressing  Minimal assistance;Sitting  Lower Body Dressing Maximal assistance;Sit to/from stand  Lower Body Dressing Details (indicate cue type and reason) limited by HA   Toilet Transfer Minimal assistance;Ambulation;Comfort height toilet  Toileting- Clothing Manipulation and Hygiene Minimal assistance;Sit to/from stand  Functional mobility during ADLs Minimal assistance  General ADL Comments Pt with severe HA   Vision- History  Baseline Vision/History No visual deficits  Patient Visual Report No change from baseline  Vision- Assessment  Additional Comments Pt with severe HA, therefore, formal visual assessment deferred.  He denies visual changes   Perception  Perception Tested? Yes  Praxis  Praxis tested? WFL  Bed Mobility  Overal bed mobility Needs Assistance  Bed Mobility Supine to Sit;Sit to Supine  Supine to sit Min guard  Sit to supine Min guard  General bed mobility comments slow and guarded egress/ingress   Transfers  Overall transfer level Needs assistance  Equipment used 1 person hand held assist  Transfers Sit to/from Stand  Sit to Stand Min assist  General transfer comment stability/safety  Balance  Overall balance assessment Needs assistance  Sitting-balance support No upper extremity supported;Single extremity supported  Sitting balance-Leahy Scale Fair  Standing balance support Single extremity supported;No upper extremity supported  Standing balance-Leahy Scale Fair  General Comments  General comments (skin integrity, edema, etc.) wife present and very supportive.  VSS   OT - End of Session  Activity Tolerance Patient limited by pain  Patient left in bed;with call bell/phone within reach;with family/visitor present  Nurse Communication Mobility status  OT Assessment  OT Recommendation/Assessment Patient needs continued OT Services  OT Visit Diagnosis Unsteadiness on feet (R26.81);Pain  Pain - part of body  (headache )  OT Problem List Decreased activity tolerance;Decreased strength;Impaired balance (sitting and/or standing);Impaired vision/perception;Decreased cognition;Pain  OT Plan  OT Frequency (ACUTE ONLY) Min 2X/week  OT Treatment/Interventions (ACUTE ONLY) Self-care/ADL training;DME and/or AE instruction;Therapeutic activities;Cognitive remediation/compensation;Visual/perceptual remediation/compensation;Patient/family education;Balance training  AM-PAC OT "6 Clicks" Daily Activity Outcome Measure  Help from another person eating meals? 4  Help from another person taking care of personal grooming? 3  Help from another person toileting, which includes using toliet, bedpan, or urinal? 3  Help from another person bathing (including washing, rinsing, drying)? 2  Help from another person to put on and taking off regular upper body clothing? 3  Help from another person to put on and taking off regular lower body clothing? 2  6 Click Score 17  ADL G Code Conversion CK  OT  Recommendation  Follow Up Recommendations Outpatient OT;Supervision - Intermittent  OT Equipment None recommended by OT  Individuals Consulted  Consulted and Agree with Results and Recommendations Patient  Acute Rehab OT Goals  Patient Stated Goal Back to work and no headache   OT Goal Formulation With patient/family  Time For Goal Achievement 04/10/18  Potential to Achieve Goals Good  OT Time Calculation  OT Start Time (ACUTE ONLY) 1625  OT Stop Time (ACUTE ONLY) 1651  OT Time Calculation (min) 26 min  OT General Charges  $OT Visit 1 Visit  OT Evaluation  $OT Eval Moderate Complexity 1 Mod  Barry Miranda, OTR/L [phone]

## 2018-03-27 NOTE — Progress Notes (Signed)
Physical Therapy Treatment Patient Details Name: Barry Miranda MRN: [id number] DOB: Aug 01, 1961 Today's Date: 03/27/2018    History of Present Illness  57 year old male with history of hyperlipidemia, presenting to the ED with headache.  CT showing large R SDH in much of the frontotemporoparietal regions.  Pt s/p crani and drain placement.    PT Comments    Pt much improved.  Headache increases with coughing.  Pt managed most aspects of the DGI without deviation and no LOB.   Follow Up Recommendations  No PT follow up(unless other changes occur, OT can manage follow up)     Equipment Recommendations  None recommended by PT    Recommendations for Other Services       Precautions / Restrictions Precautions Precautions: Fall    Mobility  Bed Mobility Overal bed mobility: Modified Independent                Transfers Overall transfer level: Needs assistance   Transfers: Sit to/from Stand Sit to Stand: Supervision            Ambulation/Gait Ambulation/Gait assistance: Supervision Gait Distance (Feet): 600 Feet Assistive device: None Gait Pattern/deviations: Step-through pattern   Gait velocity interpretation: >2.62 ft/sec, indicative of community ambulatory General Gait Details: steady in general, guarded with abrupt changes in direction   Stairs Stairs: Yes Stairs assistance: Supervision Stair Management: No rails;Alternating pattern;Backwards;Forwards Number of Stairs: 4 General stair comments: steady without rail, but guarded.   Wheelchair Mobility    Modified Rankin (Stroke Patients Only) Modified Rankin (Stroke Patients Only) Modified Rankin: Moderate disability     Balance Overall balance assessment: Needs assistance   Sitting balance-Leahy Scale: Good       Standing balance-Leahy Scale: Fair                              Cognition Arousal/Alertness: Awake/alert Behavior During Therapy: WFL for tasks  assessed/performed Overall Cognitive Status: Within Functional Limits for tasks assessed                                        Exercises      General Comments        Pertinent Vitals/Pain Pain Assessment: Faces Faces Pain Scale: Hurts little more Pain Location: head with cough Pain Descriptors / Indicators: Aching Pain Intervention(s): Monitored during session    Home Living                      Prior Function            PT Goals (current goals can now be found in the care plan section) Acute Rehab PT Goals Patient Stated Goal: Back to work PT Goal Formulation: With patient/family Time For Goal Achievement: 04/02/18 Potential to Achieve Goals: Good Progress towards PT goals: Progressing toward goals    Frequency    Min 3X/week      PT Plan Discharge plan needs to be updated    Co-evaluation              AM-PAC PT "6 Clicks" Daily Activity  Outcome Measure  Difficulty turning over in bed (including adjusting bedclothes, sheets and blankets)?: None Difficulty moving from lying on back to sitting on the side of the bed? : None Difficulty sitting down on and standing up from a chair with arms (e.g.,  wheelchair, bedside commode, etc,.)?: None Help needed moving to and from a bed to chair (including a wheelchair)?: A Little Help needed walking in hospital room?: A Little Help needed climbing 3-5 steps with a railing? : A Little 6 Click Score: 21    End of Session   Activity Tolerance: Patient tolerated treatment well Patient left: in chair;with call bell/phone within reach;with family/visitor present Nurse Communication: Mobility status PT Visit Diagnosis: Unsteadiness on feet (R26.81);Pain;Difficulty in walking, not elsewhere classified (R26.2)     Time: 2979-8921 PT Time Calculation (min) (ACUTE ONLY): 18 min  Charges:  $Gait Training: 8-22 mins                    G Codes:       Apr 14, 2018  Barry Miranda,  PT [phone]  (pager)   Barry Miranda Barry Miranda 04/14/2018, 5:01 PM

## 2018-03-27 NOTE — Progress Notes (Signed)
JP drain with minimal outpt Removed today at request of Dr Kathyrn Sheriff

## 2018-03-27 NOTE — Progress Notes (Signed)
  NEUROSURGERY PROGRESS NOTE   No issues overnight. Pt seen this am, minimal HA, 2/10. No visual changes or new weakness.  EXAM:  BP (!) 143/70   Pulse 81   Temp 98.3 F (36.8 C) (Oral)   Resp 15   Ht 5\' 4"  (1.626 m)   Wt 69.5 kg (153 lb 3.5 oz)   SpO2 98%   BMI 26.30 kg/m   Awake, alert, oriented  Speech fluent, appropriate  CN grossly intact  5/5 BUE/BLE  No drift  IMPRESSION:  57 y.o. male s/p evacuation of right SDH and subsequent EDH. Doing well, minimal drain output  PLAN: - Will d/c drain - Mobilize today - Likely transfer to floor tomorrow

## 2018-03-28 ENCOUNTER — Encounter (HOSPITAL_COMMUNITY): Payer: Self-pay | Admitting: Certified Registered"

## 2018-03-28 ENCOUNTER — Inpatient Hospital Stay (HOSPITAL_COMMUNITY): Payer: 59

## 2018-03-28 ENCOUNTER — Inpatient Hospital Stay (HOSPITAL_COMMUNITY): Payer: 59 | Admitting: Certified Registered"

## 2018-03-28 ENCOUNTER — Encounter (HOSPITAL_COMMUNITY)
Admission: EM | Disposition: A | Discharge status: Rehabilitation Facility | Payer: Self-pay | Source: Home / Self Care | Attending: Neurosurgery

## 2018-03-28 DIAGNOSIS — Z01818 Encounter for other preprocedural examination: Secondary | ICD-10-CM

## 2018-03-28 DIAGNOSIS — J9600 Acute respiratory failure, unspecified whether with hypoxia or hypercapnia: Secondary | ICD-10-CM

## 2018-03-28 DIAGNOSIS — S065X0A Traumatic subdural hemorrhage without loss of consciousness, initial encounter: Secondary | ICD-10-CM | POA: Diagnosis not present

## 2018-03-28 DIAGNOSIS — R4182 Altered mental status, unspecified: Secondary | ICD-10-CM

## 2018-03-28 DIAGNOSIS — G932 Benign intracranial hypertension: Secondary | ICD-10-CM | POA: Diagnosis not present

## 2018-03-28 HISTORY — PX: CRANIECTOMY FOR DEPRESSED SKULL FRACTURE: SHX5788

## 2018-03-28 LAB — CBC WITH DIFFERENTIAL/PLATELET
Abs Immature Granulocytes: 0.1 10*3/uL (ref 0.0–0.1)
BASOS ABS: 0 10*3/uL (ref 0.0–0.1)
BASOS PCT: 0 %
EOS ABS: 0 10*3/uL (ref 0.0–0.7)
EOS PCT: 0 %
HCT: 33.9 % — ABNORMAL LOW (ref 39.0–52.0)
Hemoglobin: 11.6 g/dL — ABNORMAL LOW (ref 13.0–17.0)
Immature Granulocytes: 1 %
Lymphocytes Relative: 15 %
Lymphs Abs: 2 10*3/uL (ref 0.7–4.0)
MCH: 30.6 pg (ref 26.0–34.0)
MCHC: 34.2 g/dL (ref 30.0–36.0)
MCV: 89.4 fL (ref 78.0–100.0)
Monocytes Absolute: 1.1 10*3/uL — ABNORMAL HIGH (ref 0.1–1.0)
Monocytes Relative: 8 %
Neutro Abs: 10.1 10*3/uL — ABNORMAL HIGH (ref 1.7–7.7)
Neutrophils Relative %: 76 %
Platelets: 281 10*3/uL (ref 150–400)
RBC: 3.79 MIL/uL — ABNORMAL LOW (ref 4.22–5.81)
RDW: 13.5 % (ref 11.5–15.5)
WBC: 13.2 10*3/uL — ABNORMAL HIGH (ref 4.0–10.5)

## 2018-03-28 LAB — BASIC METABOLIC PANEL
Anion gap: 8 (ref 5–15)
BUN: 7 mg/dL (ref 6–20)
CALCIUM: 8.8 mg/dL — AB (ref 8.9–10.3)
CO2: 26 mmol/L (ref 22–32)
CREATININE: 0.61 mg/dL (ref 0.61–1.24)
Chloride: 105 mmol/L (ref 101–111)
GFR calc Af Amer: >60 mL/min (ref >60)
Glucose, Bld: 136 mg/dL — ABNORMAL HIGH (ref 65–99)
POTASSIUM: 3.6 mmol/L (ref 3.5–5.1)
Sodium: 139 mmol/L (ref 135–145)

## 2018-03-28 LAB — SODIUM: SODIUM: 136 mmol/L (ref 135–145)

## 2018-03-28 SURGERY — CRANIECTOMY FOR DEPRESSED SKULL FRACTURE
Anesthesia: General | Site: Head | Laterality: Right

## 2018-03-28 MED ORDER — THROMBIN 5000 UNITS EX SOLR
OROMUCOSAL | Status: DC | PRN
  Administered 2018-03-28: 5 mL via TOPICAL

## 2018-03-28 MED ORDER — PROPOFOL 10 MG/ML IV BOLUS
INTRAVENOUS | Status: AC
  Filled 2018-03-28: qty 20

## 2018-03-28 MED ORDER — PROPOFOL 10 MG/ML IV BOLUS
INTRAVENOUS | Status: DC | PRN
  Administered 2018-03-28: 160 mg via INTRAVENOUS
  Administered 2018-03-28: 20 mg via INTRAVENOUS

## 2018-03-28 MED ORDER — PHENYLEPHRINE HCL 10 MG/ML IJ SOLN
INTRAMUSCULAR | Status: DC | PRN
  Administered 2018-03-28: 120 ug via INTRAVENOUS
  Administered 2018-03-28: 80 ug via INTRAVENOUS

## 2018-03-28 MED ORDER — FENTANYL CITRATE (PF) 100 MCG/2ML IJ SOLN
100.0000 ug | INTRAMUSCULAR | Status: DC | PRN
  Administered 2018-03-29 (×3): 100 ug via INTRAVENOUS
  Filled 2018-03-28 (×3): qty 2

## 2018-03-28 MED ORDER — FENTANYL CITRATE (PF) 100 MCG/2ML IJ SOLN
INTRAMUSCULAR | Status: DC | PRN
  Administered 2018-03-28 (×2): 50 ug via INTRAVENOUS
  Administered 2018-03-28: 150 ug via INTRAVENOUS

## 2018-03-28 MED ORDER — LABETALOL HCL 5 MG/ML IV SOLN
10.0000 mg | INTRAVENOUS | Status: DC | PRN
  Administered 2018-03-30: 40 mg via INTRAVENOUS
  Administered 2018-03-30 (×3): 20 mg via INTRAVENOUS
  Administered 2018-03-31: 40 mg via INTRAVENOUS
  Administered 2018-03-31: 20 mg via INTRAVENOUS
  Administered 2018-03-31: 40 mg via INTRAVENOUS
  Administered 2018-03-31 (×3): 20 mg via INTRAVENOUS
  Administered 2018-04-02: 30 mg via INTRAVENOUS
  Administered 2018-04-02: 10 mg via INTRAVENOUS
  Administered 2018-04-02 (×2): 20 mg via INTRAVENOUS
  Filled 2018-03-28: qty 8
  Filled 2018-03-28 (×7): qty 4

## 2018-03-28 MED ORDER — SODIUM CHLORIDE 3 % IV SOLN
INTRAVENOUS | Status: AC
  Administered 2018-03-28 – 2018-03-29 (×4): 75 mL/h via INTRAVENOUS
  Filled 2018-03-28 (×6): qty 500

## 2018-03-28 MED ORDER — THROMBIN 20000 UNITS EX SOLR
CUTANEOUS | Status: DC | PRN
  Administered 2018-03-28: 20 mL via TOPICAL

## 2018-03-28 MED ORDER — CEFAZOLIN SODIUM-DEXTROSE 2-3 GM-%(50ML) IV SOLR
INTRAVENOUS | Status: DC | PRN
  Administered 2018-03-28: 2 g via INTRAVENOUS

## 2018-03-28 MED ORDER — LIDOCAINE 2% (20 MG/ML) 5 ML SYRINGE
INTRAMUSCULAR | Status: AC
  Filled 2018-03-28: qty 5

## 2018-03-28 MED ORDER — SUCCINYLCHOLINE CHLORIDE 20 MG/ML IJ SOLN
INTRAMUSCULAR | Status: DC | PRN
  Administered 2018-03-28: 120 mg via INTRAVENOUS

## 2018-03-28 MED ORDER — ONDANSETRON HCL 4 MG/2ML IJ SOLN: 4.0000 mg | INTRAMUSCULAR | Status: DC | PRN

## 2018-03-28 MED ORDER — BACITRACIN ZINC 500 UNIT/GM EX OINT
TOPICAL_OINTMENT | CUTANEOUS | Status: DC | PRN
  Administered 2018-03-28: 1 via TOPICAL

## 2018-03-28 MED ORDER — NALOXONE HCL 0.4 MG/ML IJ SOLN: 0.0800 mg | INTRAMUSCULAR | Status: DC | PRN

## 2018-03-28 MED ORDER — THROMBIN 20000 UNITS EX SOLR
CUTANEOUS | Status: AC
  Filled 2018-03-28: qty 20000

## 2018-03-28 MED ORDER — THROMBIN 5000 UNITS EX SOLR
CUTANEOUS | Status: AC
  Filled 2018-03-28: qty 5000

## 2018-03-28 MED ORDER — PHENYLEPHRINE 40 MCG/ML (10ML) SYRINGE FOR IV PUSH (FOR BLOOD PRESSURE SUPPORT)
PREFILLED_SYRINGE | INTRAVENOUS | Status: AC
  Filled 2018-03-28: qty 10

## 2018-03-28 MED ORDER — MICROFIBRILLAR COLL HEMOSTAT EX PADS
MEDICATED_PAD | CUTANEOUS | Status: DC | PRN
  Administered 2018-03-28: 1 via TOPICAL

## 2018-03-28 MED ORDER — CEFAZOLIN SODIUM-DEXTROSE 2-4 GM/100ML-% IV SOLN: 2.0000 g | INTRAVENOUS | Status: DC

## 2018-03-28 MED ORDER — SODIUM CHLORIDE 0.9 % IV SOLN
INTRAVENOUS | Status: DC | PRN
  Administered 2018-03-28: 21:00:00 via INTRAVENOUS

## 2018-03-28 MED ORDER — LEVETIRACETAM IN NACL 1000 MG/100ML IV SOLN
1000.0000 mg | Freq: Two times a day (BID) | INTRAVENOUS | Status: DC
  Administered 2018-03-29 – 2018-04-03 (×12): 1000 mg via INTRAVENOUS
  Filled 2018-03-28 (×12): qty 100

## 2018-03-28 MED ORDER — PROPOFOL 1000 MG/100ML IV EMUL
0.0000 ug/kg/min | INTRAVENOUS | Status: DC
  Administered 2018-03-29: 50 ug/kg/min via INTRAVENOUS
  Administered 2018-03-29: 35 ug/kg/min via INTRAVENOUS
  Filled 2018-03-28 (×2): qty 100

## 2018-03-28 MED ORDER — CEFAZOLIN SODIUM-DEXTROSE 1-4 GM/50ML-% IV SOLN
1.0000 g | Freq: Three times a day (TID) | INTRAVENOUS | Status: AC
  Administered 2018-03-29 (×2): 1 g via INTRAVENOUS
  Filled 2018-03-28 (×2): qty 50

## 2018-03-28 MED ORDER — FENTANYL CITRATE (PF) 100 MCG/2ML IJ SOLN: 100.0000 ug | INTRAMUSCULAR | Status: DC | PRN

## 2018-03-28 MED ORDER — ROCURONIUM BROMIDE 50 MG/5ML IV SOLN
INTRAVENOUS | Status: AC
  Filled 2018-03-28: qty 1

## 2018-03-28 MED ORDER — 0.9 % SODIUM CHLORIDE (POUR BTL) OPTIME
TOPICAL | Status: DC | PRN
  Administered 2018-03-28 (×2): 1000 mL

## 2018-03-28 MED ORDER — ROCURONIUM BROMIDE 100 MG/10ML IV SOLN
INTRAVENOUS | Status: DC | PRN
  Administered 2018-03-28: 50 mg via INTRAVENOUS

## 2018-03-28 MED ORDER — BACITRACIN ZINC 500 UNIT/GM EX OINT
TOPICAL_OINTMENT | CUTANEOUS | Status: AC
  Filled 2018-03-28: qty 28.35

## 2018-03-28 MED ORDER — PROPOFOL 500 MG/50ML IV EMUL
INTRAVENOUS | Status: DC | PRN
  Administered 2018-03-28: 50 ug/kg/min via INTRAVENOUS

## 2018-03-28 MED ORDER — PHENYLEPHRINE HCL 10 MG/ML IJ SOLN
INTRAMUSCULAR | Status: DC | PRN
  Administered 2018-03-28: 50 ug/min via INTRAVENOUS

## 2018-03-28 MED ORDER — LIDOCAINE HCL (CARDIAC) PF 100 MG/5ML IV SOSY
PREFILLED_SYRINGE | INTRAVENOUS | Status: DC | PRN
  Administered 2018-03-28: 100 mg via INTRAVENOUS

## 2018-03-28 MED ORDER — PROMETHAZINE HCL 25 MG PO TABS: 12.5000 mg | ORAL_TABLET | ORAL | Status: DC | PRN

## 2018-03-28 MED ORDER — ONDANSETRON HCL 4 MG PO TABS: 4.0000 mg | ORAL_TABLET | ORAL | Status: DC | PRN

## 2018-03-28 MED ORDER — MANNITOL 25 % IV SOLN
INTRAVENOUS | Status: DC | PRN
  Administered 2018-03-28: 50 g via INTRAVENOUS

## 2018-03-28 MED ORDER — ACETAMINOPHEN 325 MG PO TABS
650.0000 mg | ORAL_TABLET | ORAL | Status: DC | PRN
  Administered 2018-03-30 – 2018-04-04 (×3): 650 mg via ORAL
  Filled 2018-03-28 (×3): qty 2

## 2018-03-28 MED ORDER — LEVETIRACETAM IN NACL 500 MG/100ML IV SOLN: 500.0000 mg | Freq: Two times a day (BID) | INTRAVENOUS | Status: DC

## 2018-03-28 MED ORDER — FENTANYL CITRATE (PF) 250 MCG/5ML IJ SOLN
INTRAMUSCULAR | Status: AC
  Filled 2018-03-28: qty 5

## 2018-03-28 MED ORDER — ACETAMINOPHEN 650 MG RE SUPP: 650.0000 mg | RECTAL | Status: DC | PRN

## 2018-03-28 MED ORDER — SODIUM CHLORIDE 0.9 % IV SOLN
INTRAVENOUS | Status: DC
  Administered 2018-03-28 – 2018-04-02 (×3): via INTRAVENOUS

## 2018-03-28 SURGICAL SUPPLY — 80 items
APL SKNCLS STERI-STRIP NONHPOA (GAUZE/BANDAGES/DRESSINGS)
BENZOIN TINCTURE PRP APPL 2/3 (GAUZE/BANDAGES/DRESSINGS) IMPLANT
BLADE CLIPPER SURG (BLADE) ×3 IMPLANT
BLADE ULTRA TIP 2M (BLADE) ×3 IMPLANT
BNDG GAUZE ELAST 4 BULKY (GAUZE/BANDAGES/DRESSINGS) IMPLANT
BUR ACORN 6.0 PRECISION (BURR) ×1 IMPLANT
BUR ACORN 6.0MM PRECISION (BURR)
BUR MATCHSTICK NEURO 3.0 LAGG (BURR) IMPLANT
BUR SPIRAL ROUTER 2.3 (BUR) IMPLANT
BUR SPIRAL ROUTER 2.3MM (BUR)
CANISTER SUCT 3000ML PPV (MISCELLANEOUS) ×3 IMPLANT
CARTRIDGE OIL MAESTRO DRILL (MISCELLANEOUS) ×1 IMPLANT
CLIP VESOCCLUDE MED 6/CT (CLIP) IMPLANT
DIFFUSER DRILL AIR PNEUMATIC (MISCELLANEOUS) ×3 IMPLANT
DRAPE HALF SHEET 40X57 (DRAPES) ×2 IMPLANT
DRAPE NEUROLOGICAL W/INCISE (DRAPES) ×3 IMPLANT
DRAPE SURG 17X23 STRL (DRAPES) IMPLANT
DRAPE WARM FLUID 44X44 (DRAPE) ×3 IMPLANT
DRSG OPSITE POSTOP 4X6 (GAUZE/BANDAGES/DRESSINGS) ×2 IMPLANT
DRSG TELFA 3X8 NADH (GAUZE/BANDAGES/DRESSINGS) ×3 IMPLANT
DURAPREP 6ML APPLICATOR 50/CS (WOUND CARE) ×3 IMPLANT
ELECT CAUTERY BLADE 6.4 (BLADE) ×3 IMPLANT
ELECT REM PT RETURN 9FT ADLT (ELECTROSURGICAL) ×3
ELECTRODE REM PT RTRN 9FT ADLT (ELECTROSURGICAL) ×1 IMPLANT
EVACUATOR 1/8 PVC DRAIN (DRAIN) IMPLANT
EVACUATOR SILICONE 100CC (DRAIN) IMPLANT
GAUZE SPONGE 4X4 12PLY STRL (GAUZE/BANDAGES/DRESSINGS) ×3 IMPLANT
GAUZE SPONGE 4X4 16PLY XRAY LF (GAUZE/BANDAGES/DRESSINGS) ×2 IMPLANT
GLOVE BIO SURGEON STRL SZ 6.5 (GLOVE) ×1 IMPLANT
GLOVE BIO SURGEON STRL SZ7 (GLOVE) ×6 IMPLANT
GLOVE BIO SURGEONS STRL SZ 6.5 (GLOVE) ×1
GLOVE BIOGEL PI IND STRL 7.0 (GLOVE) IMPLANT
GLOVE BIOGEL PI IND STRL 7.5 (GLOVE) ×1 IMPLANT
GLOVE BIOGEL PI INDICATOR 7.0 (GLOVE)
GLOVE BIOGEL PI INDICATOR 7.5 (GLOVE) ×2
GLOVE ECLIPSE 7.0 STRL STRAW (GLOVE) ×8 IMPLANT
GLOVE EXAM NITRILE LRG STRL (GLOVE) IMPLANT
GLOVE EXAM NITRILE XL STR (GLOVE) IMPLANT
GLOVE EXAM NITRILE XS STR PU (GLOVE) IMPLANT
GOWN STRL REUS W/ TWL LRG LVL3 (GOWN DISPOSABLE) ×2 IMPLANT
GOWN STRL REUS W/ TWL XL LVL3 (GOWN DISPOSABLE) IMPLANT
GOWN STRL REUS W/TWL 2XL LVL3 (GOWN DISPOSABLE) IMPLANT
GOWN STRL REUS W/TWL LRG LVL3 (GOWN DISPOSABLE) ×6
GOWN STRL REUS W/TWL XL LVL3 (GOWN DISPOSABLE)
HEMOSTAT POWDER KIT SURGIFOAM (HEMOSTASIS) ×3 IMPLANT
HEMOSTAT SURGICEL 2X14 (HEMOSTASIS) IMPLANT
HOOK DURA 1/2IN (MISCELLANEOUS) ×2 IMPLANT
KIT BASIN OR (CUSTOM PROCEDURE TRAY) ×3 IMPLANT
KIT TURNOVER KIT B (KITS) ×3 IMPLANT
NDL HYPO 25X1 1.5 SAFETY (NEEDLE) ×1 IMPLANT
NEEDLE HYPO 25X1 1.5 SAFETY (NEEDLE) ×3 IMPLANT
NS IRRIG 1000ML POUR BTL (IV SOLUTION) ×3 IMPLANT
OIL CARTRIDGE MAESTRO DRILL (MISCELLANEOUS)
PACK CRANIOTOMY CUSTOM (CUSTOM PROCEDURE TRAY) ×3 IMPLANT
PAD DRESSING TELFA 3X8 NADH (GAUZE/BANDAGES/DRESSINGS) IMPLANT
PATTIES SURGICAL .5 X.5 (GAUZE/BANDAGES/DRESSINGS) IMPLANT
PATTIES SURGICAL .5 X3 (DISPOSABLE) IMPLANT
PATTIES SURGICAL 1X1 (DISPOSABLE) IMPLANT
SPONGE NEURO XRAY DETECT 1X3 (DISPOSABLE) IMPLANT
SPONGE SURGIFOAM ABS GEL 100 (HEMOSTASIS) ×3 IMPLANT
STAPLER SKIN PROX WIDE 3.9 (STAPLE) ×2 IMPLANT
STAPLER VISISTAT 35W (STAPLE) ×5 IMPLANT
STOCKINETTE 6  STRL (DRAPES) ×2
STOCKINETTE 6 STRL (DRAPES) ×1 IMPLANT
SUT ETHILON 3 0 FSL (SUTURE) IMPLANT
SUT ETHILON 3 0 PS 1 (SUTURE) IMPLANT
SUT NURALON 4 0 TR CR/8 (SUTURE) ×5 IMPLANT
SUT STEEL 0 (SUTURE)
SUT STEEL 0 18XMFL TIE 17 (SUTURE) IMPLANT
SUT VIC AB 0 CT1 18XCR BRD8 (SUTURE) ×2 IMPLANT
SUT VIC AB 0 CT1 8-18 (SUTURE) ×6
SUT VIC AB 3-0 SH 8-18 (SUTURE) ×10 IMPLANT
SYR CONTROL 10ML LL (SYRINGE) ×6 IMPLANT
TOWEL GREEN STERILE (TOWEL DISPOSABLE) ×3 IMPLANT
TOWEL GREEN STERILE FF (TOWEL DISPOSABLE) ×3 IMPLANT
TRAY FOLEY MTR SLVR 16FR STAT (SET/KITS/TRAYS/PACK) ×3 IMPLANT
TUBE CONNECTING 12'X1/4 (SUCTIONS) ×1
TUBE CONNECTING 12X1/4 (SUCTIONS) ×2 IMPLANT
UNDERPAD 30X30 (UNDERPADS AND DIAPERS) ×3 IMPLANT
WATER STERILE IRR 1000ML POUR (IV SOLUTION) ×3 IMPLANT

## 2018-03-28 NOTE — Anesthesia Procedure Notes (Signed)
Central Venous Catheter Insertion Performed by: Audry Pili, MD, anesthesiologist Start/End6/20/2019 10:34 PM, 03/28/2018 10:41 PM Patient location: Pre-op. Preanesthetic checklist: patient identified, IV checked, risks and benefits discussed, surgical consent, monitors and equipment checked, pre-op evaluation, timeout performed and anesthesia consent Position: Trendelenburg Patient sedated Hand hygiene performed , maximum sterile barriers used  and Seldinger technique used Catheter size: 8 Fr Total catheter length 16. Central line was placed.Double lumen Procedure performed using ultrasound guided technique. Ultrasound Notes:anatomy identified, needle tip was noted to be adjacent to the nerve/plexus identified, no ultrasound evidence of intravascular and/or intraneural injection and image(s) printed for medical record Attempts: 1 Following insertion, dressing applied, line sutured and Biopatch. Post procedure assessment: blood return through all ports  Patient tolerated the procedure well with no immediate complications.

## 2018-03-28 NOTE — Anesthesia Procedure Notes (Signed)
Procedure Name: Intubation Date/Time: 03/28/2018 8:56 PM Performed by: Babs Bertin, CRNA Pre-anesthesia Checklist: Patient identified, Emergency Drugs available, Suction available and Patient being monitored Patient Re-evaluated:Patient Re-evaluated prior to induction Oxygen Delivery Method: Circle System Utilized Preoxygenation: Pre-oxygenation with 100% oxygen Induction Type: IV induction and Rapid sequence Laryngoscope Size: Miller and 3 Grade View: Grade II Tube type: Subglottic suction tube Tube size: 7.5 mm Number of attempts: 1 Airway Equipment and Method: Stylet and Oral airway Placement Confirmation: ETT inserted through vocal cords under direct vision,  positive ETCO2 and breath sounds checked- equal and bilateral Secured at: 22 cm Tube secured with: Tape Dental Injury: Teeth and Oropharynx as per pre-operative assessment

## 2018-03-28 NOTE — OR Nursing (Signed)
Incision made at 2150 to abdomen. Bone Flap placed and abdomen closed at 2206. Count Correct.

## 2018-03-28 NOTE — Progress Notes (Addendum)
Neurosurgery Progress Note  HA overnight that has improved this am. Wife and nursing reports more lethargic, slurring speech  EXAM:  BP (!) 161/89   Pulse 71   Temp 98.5 F (36.9 C) (Oral)   Resp 13   Ht 5\' 4"  (1.626 m)   Wt 69.5 kg (153 lb 3.5 oz)   SpO2 98%   BMI 26.30 kg/m   Lethargic. Needs stimulation to stay awake Oriented to self, location but not to year (states it is 1964) Slurred speech MAEW and symmetric Incision c/d/i  IMPRESSION/PLAN 57 y.o. male s/p evacuation of right SDH and subsequent EDH. More lethargic this am with slurring speech.  - Will repeat head CT, obtain CBC, BMP; if normal will obtain EEG - Hold on transferring to the floor

## 2018-03-28 NOTE — Progress Notes (Signed)
Received call from nursing that patient continues to deteriorate neurologically. Only oriented to self, more lethargic.   Came to evaluate the patient. He is holding head. Eyes closed. Does follow commands, but needs encouragement to stay awake.  Oriented to name, but not to DOB, year, location. Currently undergoing EEG  Question seizure vs. Worsening edema and MLS. Will obtain stat head CT May need to consider mannitol vs. craniectomy.

## 2018-03-28 NOTE — Progress Notes (Signed)
EEG complete - results pending 

## 2018-03-28 NOTE — Progress Notes (Signed)
PULMONARY / CRITICAL CARE MEDICINE   Name: Barry Miranda MRN: [id number] DOB: Jun 21, 1961    ADMISSION DATE:  03/23/2018 CONSULTATION DATE:  03/25/2018  REFERRING MD:  Dr. Vertell Limber   CHIEF COMPLAINT:  Intubated Post-Op  HISTORY OF PRESENT ILLNESS:   57 year old male with PMH of HLD  Presents to ED on 6/16 with headache with nausea/vomiting. Patient with left hemiparesis. CT head with large hemispheric SDH on the right. Taken to OR for craniotomy with evacuation. On 6/16 at 2030 patient with lethargy. Taken to CT which revealed large acute epidural hematoma under the bone flap with significant mass-effect . Taken back to OR for evacuation. Remained intubated in post-operative period. PCCM consulted.    SUBJECTIVE:  Extubated 6/17 and tolerated well. Head CT 6/18 with slightly more edema.  More lethargic with slurring of speech 6/20.  Had further deterioration that day with change in mental status.  CT head obtained but essentially unchanged.  Keppra increased and hypertonic saline started.  Given continued decline, decision made to proceed with right decompressive craniectomy.  Evening 6/20, taken to OR then returned to ICU on vent and PCCM called back to assist with vent management.  VITAL SIGNS: BP 131/73 (BP Location: Right Arm)   Pulse 78   Temp 98.8 F (37.1 C) (Oral)   Resp 12   Ht 5\' 4"  (1.626 m)   Wt 69.5 kg (153 lb 3.5 oz)   SpO2 97%   BMI 26.30 kg/m   HEMODYNAMICS:    VENTILATOR SETTINGS: Vent Mode: PRVC FiO2 (%):  [40 %] 40 % Set Rate:  [12 bmp] 12 bmp Vt Set:  [480 mL] 480 mL PEEP:  [5 cmH20] 5 cmH20 Plateau Pressure:  [15 cmH20] 15 cmH20  INTAKE / OUTPUT: I/O last 3 completed shifts: In: 3420.9 [P.O.:720; I.V.:2400.9; IV Piggyback:300] Out: 2671 [Urine:4545]  PHYSICAL EXAMINATION: General:  Adult male, in NAD. Neuro:  Sedated, not responsive. HEENT:  ETT in place, Head dressings with staples C/D/I. Cardiovascular:  RRR, no MRG. Lungs:  Resps unlabored.   CTAB. Abdomen:  Active bowel sounds, non-distended. Musculoskeletal:  No deformities, no edema. Skin:  Warm, dry.   LABS:  BMET Recent Labs  Lab 03/25/18 0348 03/26/18 0332 03/28/18 1412 03/28/18 1711  NA 141 142 139 136  K 3.6 3.4* 3.6  --   CL 110 110 105  --   CO2 23 25 26   --   BUN 9 6 7   --   CREATININE 0.74 0.68 0.61  --   GLUCOSE 147* 125* 136*  --     Electrolytes Recent Labs  Lab 03/25/18 0348 03/26/18 0332 03/28/18 1412  CALCIUM 7.6* 8.3* 8.8*  MG 1.9 2.4  --   PHOS 3.2 1.9*  --     CBC Recent Labs  Lab 03/25/18 0348 03/26/18 0332 03/28/18 1412  WBC 15.0* 9.8 13.2*  HGB 11.6* 11.3* 11.6*  HCT 34.0* 33.0* 33.9*  PLT 224 183 281    Coag's Recent Labs  Lab 03/24/18 0345  APTT 33  INR 0.86    Sepsis Markers No results for input(s): LATICACIDVEN, PROCALCITON, O2SATVEN in the last 168 hours.  ABG Recent Labs  Lab 03/24/18 0638 03/25/18 0155  PHART 7.347* 7.370  PCO2ART 38.4 41.4  PO2ART 470.0* 218*    Liver Enzymes Recent Labs  Lab 03/24/18 0345  AST 23  ALT 27  ALKPHOS 59  BILITOT 0.5  ALBUMIN 5.0    Cardiac Enzymes No results for input(s): TROPONINI, PROBNP in  the last 168 hours.  Glucose No results for input(s): GLUCAP in the last 168 hours.  Imaging Ct Head Wo Contrast  Result Date: 03/28/2018 CLINICAL DATA:  Altered level of consciousness, lethargy EXAM: CT HEAD WITHOUT CONTRAST TECHNIQUE: Contiguous axial images were obtained from the base of the skull through the vertex without intravenous contrast. COMPARISON:  CT brain scan 03/28/2017 and 03/26/2017 FINDINGS: Brain: There is little change in the mixed density right subdural hematoma in the frontotemporal region with some acute component which is stable. Hemorrhagic contusion in the right temporal region is stable as well. No new area of hemorrhage is seen. Midline shift now measures 10 mm compared to 11.5 mm previously. Effacement of right cortical sulci and some  narrowing of the ventricular system remains unchanged. Old left basal ganglial lacunar infarct is stable. The fourth ventricle and basilar cisterns are stable. A small amount of pneumocephalus noted after craniotomy for subdural hematoma evacuation. Vascular: No vascular abnormality is noted on this unenhanced study. Skull: On bone window images, changes of right craniotomy are noted. There is soft tissue swelling over the craniotomy site. Small amount of pneumocephaly is present. Sinuses/Orbits: The paranasal sinuses appear well pneumatized air Other: None. IMPRESSION: 1. Stable mixed density right subdural hematoma with some decreased shift of mediastinum to the left from 11.5 mm to 10 mm currently. 2. No change in hemorrhagic in the right temporal region. Electronically Signed   By: Ivar Drape M.D.   On: 03/28/2018 15:40   Ct Head Wo Contrast  Result Date: 03/28/2018 CLINICAL DATA:  Subdural hematoma. Increased lethargy and slurred speech today. EXAM: CT HEAD WITHOUT CONTRAST TECHNIQUE: Contiguous axial images were obtained from the base of the skull through the vertex without intravenous contrast. COMPARISON:  CT head without contrast 03/26/2018 and 03/24/2018. FINDINGS: Brain: A right extensive E subdural hematoma is again seen without significant interval change. Hemorrhagic contusion in the right temporal lobe is again seen. No new areas of hemorrhage are present. Midline shift has increased since the prior exam, now measuring 11.5 mm. There is mass effect with effacement of the sulci bilaterally. Mild prominence of the left temporal tip is again noted. The right lateral ventricle remains effaced. Stable blood products are noted over the tentorium. Lacunar infarct is again noted within the left basal ganglia. No acute cortical infarct is present. Vascular: Atherosclerotic calcifications are present along the cavernous right internal carotid artery. No hyperdense vessel is present. Skull: Right craniotomy  is again noted. Extracranial hematoma is similar to the prior exam. Some gas remains external to the craniotomy following removal of the surgical drain. Sinuses/Orbits: The globes and orbits are within normal limits. The paranasal sinuses and mastoid air cells are clear. IMPRESSION: 1. Mixed density right subdural hematoma is not significantly changed in size. It measures up to 12 mm maximally on the coronal images. 2. No new hemorrhage. Midline shift has increased from 10 mm to 11.5 mm. 3. Mass effect with effacement of the sulci and right lateral ventricle. There is slight prominence of the left temporal tip without significant interval change. 4. Remote lacunar infarct of the left basal ganglia versus choroidal fissure cyst. Electronically Signed   By: San Morelle M.D.   On: 03/28/2018 08:53     STUDIES:  CT Head 6/16 > 1. Acute 1 cm RIGHT holo hemispheric subdural hematoma resulting 1 cm RIGHT-to-LEFT subfalcine herniation with LEFT lateral ventricle entrapment. 2. Trace acute falcotentorial subdural hematoma. CT Head 6/16 > 1. Interval right lateral craniotomy,  placement of extra-axial drain, partial evacuation of subdural hematoma. 2. Acute extra-axial hemorrhage over the right convexity is now concentrated subjacent to the craniotomy and increased in thickness when compared with the preoperative CT which may represent postoperative change and/or interval hemorrhage. 3. New acute 15 mm intra-axial hemorrhage in right posterior temporal lobe. 4. Mass effect with stable 10 mm right-to-left midline shift and mild left ventricular entrapment. CT head 6/17 > decreased right SDH with similar small falcotentorial SDH.  25mm residual R to L MLS which is decreased from 47mm, resolving left ventricular entrapment, evolving 94mm right temporal lobe hematoma. CT head 6/18 > mildly increased mass effect, otherwise stable. CT head 6/20 > no significant change. EEG 6/20 > generalized cerebral  dysfunction.  CULTURES: MRSA 6/16 > Negative  STaph 6/16 > Negative   ANTIBIOTICS: None.   SIGNIFICANT EVENTS: 6/16 > Presents to ED  6/17 > extubated. 6/20 > back to OR for right decompressive craniectomy.  LINES/TUBES: ETT 6/16 > 6/17.  6/20 >   DISCUSSION: 57 year old male presents to ED on 6/16 with Right SDH. Underwent evacuation. On 6/16 2030 patient with lethargy. CT Head with large acute epidural hematoma. Taken to OR for Craniotomy with evacuation.  Extubated 6/17 and tolerated well. Had change in mental status 6/18 and worsened 6/20 so taken back to OR for right decompressive craniectomy.  PCCM called back for vent management.  ASSESSMENT / PLAN:  Respiratory Insufficieny in post-operative setting  Plan  Continue full vent support SBT in AM if stable VAP Bundle  Pulmonary Hygiene  Follow CXR  HTN H/O HLD  Plan  Cardiac Monitoring BP goals per neurosurgery Hydralazine and Labetalol PRN Continue Zocor   SUP Nutrition Plan Famotidine NPO Start TF's if unable to extubate in AM  Right SDH s/p Evacuation. Large Acute Epidural Hematoma s/p Evacuation and Craniotomy  S/p right decompressive craniectomy 6/20 after worsening mental status Plan  Neurosurgery managing / following Sedation: Propofol gtt / Fentanyl PRN RASS goal 0/-1  Continue keppra, 3% saline per neurosurgery Hold all anticoagulation    FAMILY  - Updates: Wife updated at bedside.  - Inter-disciplinary family meet or Palliative Care meeting due by:  04/01/2018    CC time: 30 min.   Montey Hora, [facility] Pager: [phone]  or [phone] 03/28/2018, 11:26 PM

## 2018-03-28 NOTE — Transfer of Care (Signed)
Immediate Anesthesia Transfer of Care Note  Patient: Barry Miranda  Procedure(s) Performed: CRANIECTOMY for cerebral edema with Placement of Skull Flap in Abdomen (Right Head)  Patient Location: ICU  Anesthesia Type:General  Level of Consciousness: Patient remains intubated per anesthesia plan  Airway & Oxygen Therapy: Patient remains intubated per anesthesia plan and Patient placed on Ventilator (see vital sign flow sheet for setting)  Post-op Assessment: Report given to RN and Post -op Vital signs reviewed and stable  Post vital signs: Reviewed and stable  Last Vitals:  Vitals Value Taken Time  BP 132/79 03/28/2018 11:00 PM  Temp    Pulse 84 03/28/2018 11:12 PM  Resp 11 03/28/2018 11:12 PM  SpO2 100 % 03/28/2018 11:12 PM  Vitals shown include unvalidated device data.  Last Pain:  Vitals:   03/28/18 2000  TempSrc:   PainSc: 2       Patients Stated Pain Goal: 0 (10/01/48 7530)  Complications: No apparent anesthesia complications

## 2018-03-28 NOTE — Progress Notes (Addendum)
Neurosurgery Progress Note  Called by nursing for continued decline in mental status. Came by to evaluate the patient.  He is lethargic.  Requires sternal rub to be awoken at first After 30 minutes, patient started to improve neurologically Follows commands, alert to self and location.   Impression/Plan  - Neurologically he is waxing and waning.   - Attending, Dr Kathyrn Sheriff, came by to evaluate patient.   - Because of waxing/waning neuro status but persistent drowsiness treatment options were discussed including continued medical management and monitoring vs. Craniectomy. Risks and benefits of each were discussed at length with wife including the possibility that craniectomy may not improve his neuro status or prevent the waxing and waning. She states in own language understanding and wishes to proceed with craniectomy. Consent signed.  Will plan for continuous EEG post operatively.

## 2018-03-28 NOTE — Progress Notes (Signed)
Occupational Therapy Treatment Patient Details Name: Barry Miranda MRN: [id number] DOB: 1961-05-12 Today's Date: 03/28/2018    History of present illness  57 year old male with history of hyperlipidemia, presenting to the ED with headache.  CT showing large R SDH in much of the frontotemporoparietal regions.  Pt s/p crani and drain placement. Pt with increased lethargy on 6/19; repeat CT on 6/20 with 1.98mm midline shift increase.   OT comments  Session limited today by lethargy, pain, and nausea. Pt required min assist for bed mobility and sit to stand at EOB with few side steps toward [facility] for repositioning. Pt required min assist for self feeding sitting at EOB. Will continue to evaluate d/c plan as pt progresses. Will continue to follow acutely.   Follow Up Recommendations  Outpatient OT;Supervision - Intermittent    Equipment Recommendations  None recommended by OT    Recommendations for Other Services      Precautions / Restrictions Precautions Precautions: Fall Restrictions Weight Bearing Restrictions: No       Mobility Bed Mobility Overal bed mobility: Needs Assistance Bed Mobility: Supine to Sit;Sit to Supine     Supine to sit: Min assist Sit to supine: Min assist   General bed mobility comments: Assist for trunk elevation to sitting and controlled descent of trunk to supine. Cues thorughout for initiation.  Transfers Overall transfer level: Needs assistance Equipment used: 1 person hand held assist Transfers: Sit to/from Stand Sit to Stand: Min assist         General transfer comment: for balance in standing    Balance Overall balance assessment: Needs assistance Sitting-balance support: Feet unsupported Sitting balance-Leahy Scale: Fair Sitting balance - Comments: Min guard throughout sitting   Standing balance support: Single extremity supported Standing balance-Leahy Scale: Poor Standing balance comment: Min HHA for standing balance                            ADL either performed or assessed with clinical judgement   ADL Overall ADL's : Needs assistance/impaired Eating/Feeding: Minimal assistance;Sitting Eating/Feeding Details (indicate cue type and reason): Min assist for self feeding at EOB, pt with eyes closed throughout and weak grip on cup                                 Functional mobility during ADLs: Minimal assistance(for few side steps at EOB) General ADL Comments: Pt with increased lethargy today and severe headache and reporting nausea. RN presenting and assisting--gave pain and nausea medicine. Session limited due to pain and medical complications. VSS throughout.     Vision       Perception     Praxis      Cognition Arousal/Alertness: Lethargic Behavior During Therapy: Flat affect                                   General Comments: Follows one step commands with increased time, requires verbal and tactile stimulation to maintain arousal        Exercises     Shoulder Instructions       General Comments      Pertinent Vitals/ Pain       Pain Assessment: Faces Faces Pain Scale: Hurts whole lot Pain Location: head, stomach Pain Descriptors / Indicators: Headache;Guarding Pain Intervention(s): Monitored during session;Limited activity within patient's  tolerance;RN gave pain meds during session  Home Living                                          Prior Functioning/Environment              Frequency  Min 2X/week        Progress Toward Goals  OT Goals(current goals can now be found in the care plan section)  Progress towards OT goals: Not progressing toward goals - comment(nausea, pain, lethargy limiting)  Acute Rehab OT Goals Patient Stated Goal: decrease headache OT Goal Formulation: With patient/family  Plan Discharge plan remains appropriate    Co-evaluation                 AM-PAC PT "6 Clicks" Daily Activity      Outcome Measure   Help from another person eating meals?: A Little Help from another person taking care of personal grooming?: A Little Help from another person toileting, which includes using toliet, bedpan, or urinal?: A Lot Help from another person bathing (including washing, rinsing, drying)?: A Lot Help from another person to put on and taking off regular upper body clothing?: A Little Help from another person to put on and taking off regular lower body clothing?: A Lot 6 Click Score: 15    End of Session    OT Visit Diagnosis: Unsteadiness on feet (R26.81);Pain Pain - part of body: (head, stomach)   Activity Tolerance Patient limited by lethargy;Patient limited by pain   Patient Left in bed;with call bell/phone within reach;with family/visitor present   Nurse Communication          Time: 1224-8250 OT Time Calculation (min): 14 min  Charges: OT General Charges $OT Visit: 1 Visit OT Treatments $Therapeutic Activity: 8-22 mins  Leandria Thier A. Ulice Brilliant, M.S., OTR/L Acute Rehab Department: [phone]   Binnie Kand 03/28/2018, 11:30 AM

## 2018-03-28 NOTE — Anesthesia Preprocedure Evaluation (Signed)
Anesthesia Evaluation  Patient identified by MRN, date of birth, ID band Patient confused  Preop documentation limited or incomplete due to emergent nature of procedure.  Airway Mallampati: II  TM Distance: >3 FB Neck ROM: Full    Dental  (+) Dental Advisory Given,    Pulmonary Current Smoker,    breath sounds clear to auscultation       Cardiovascular negative cardio ROS   Rhythm:Regular Rate:Normal     Neuro/Psych S/p craniotomy for epidural/subdural hematoma on 6/16 now with worsening mental status negative psych ROS   GI/Hepatic negative GI ROS, Neg liver ROS,   Endo/Other  negative endocrine ROS  Renal/GU negative Renal ROS     Musculoskeletal negative musculoskeletal ROS (+)   Abdominal   Peds  Hematology  (+) anemia ,   Anesthesia Other Findings   Reproductive/Obstetrics                             Anesthesia Physical  Anesthesia Plan  ASA: III and emergent  Anesthesia Plan: General   Post-op Pain Management:    Induction: Intravenous  PONV Risk Score and Plan: 2 and Treatment may vary due to age or medical condition, Ondansetron, Propofol infusion and Dexamethasone  Airway Management Planned: Oral ETT  Additional Equipment: Arterial line  Intra-op Plan:   Post-operative Plan: Possible Post-op intubation/ventilation  Informed Consent: I have reviewed the patients History and Physical, chart, labs and discussed the procedure including the risks, benefits and alternatives for the proposed anesthesia with the patient or authorized representative who has indicated his/her understanding and acceptance.   Dental advisory given  Plan Discussed with: CRNA and Anesthesiologist  Anesthesia Plan Comments:         Anesthesia Quick Evaluation                                   Anesthesia Evaluation  Patient identified by MRN, date of birth, ID band Patient  awake    Reviewed: Allergy & Precautions, NPO status , Patient's Chart, lab work & pertinent test results  Airway Mallampati: III  TM Distance: >3 FB Neck ROM: Full    Dental  (+) Dental Advisory Given   Pulmonary Current Smoker,    breath sounds clear to auscultation       Cardiovascular negative cardio ROS   Rhythm:Regular Rate:Normal - Diastolic murmurs    Neuro/Psych Right subdural hematoma    GI/Hepatic negative GI ROS, Neg liver ROS,   Endo/Other  negative endocrine ROS  Renal/GU negative Renal ROS     Musculoskeletal   Abdominal   Peds  Hematology negative hematology ROS (+)   Anesthesia Other Findings Pt very somnolent. Oriented to place but drifts back off to sleep. Pt intermittently falls back asleep but remains arousable to voice. Follows commands.  Reproductive/Obstetrics                             Lab Results  Component Value Date   WBC 13.2 (H) 03/28/2018   HGB 11.6 (L) 03/28/2018   HCT 33.9 (L) 03/28/2018   MCV 89.4 03/28/2018   PLT 281 03/28/2018   Lab Results  Component Value Date   CREATININE 0.61 03/28/2018   BUN 7 03/28/2018   NA 136 03/28/2018   K 3.6 03/28/2018   CL 105 03/28/2018   CO2  26 03/28/2018    Anesthesia Physical Anesthesia Plan  ASA: IV and emergent  Anesthesia Plan: General   Post-op Pain Management:    Induction: Intravenous  PONV Risk Score and Plan: 1 and Ondansetron, Dexamethasone and Treatment may vary due to age or medical condition  Airway Management Planned: Oral ETT  Additional Equipment: Arterial line  Intra-op Plan:   Post-operative Plan: Possible Post-op intubation/ventilation  Informed Consent: I have reviewed the patients History and Physical, chart, labs and discussed the procedure including the risks, benefits and alternatives for the proposed anesthesia with the patient or authorized representative who has indicated his/her understanding and  acceptance.   Dental advisory given  Plan Discussed with: CRNA  Anesthesia Plan Comments:         Anesthesia Quick Evaluation

## 2018-03-28 NOTE — Procedures (Signed)
Date of recording 03/28/2018  Referring physician Stem  Technical Digital EEG recording using 10-20 international electrode system  Reason for the study Altered mental status  Description of the recording When awake posterior dominant rhythm on the right is 6 to 7 Hz and reactive, on the left is reaches up to 8 Hz also reactive. EEG comprised of generalized delta and theta slowing more pronounced in the right hemisphere. Sleep architecture was not seen. Epileptiform features were not seen during this recording.  Impression The EEG is abnormal and findings are consistent with generalized and focal left cerebral dysfunction suggesting underlying left hemispheric structural defect. Epileptiform features were not seen during this recording.

## 2018-03-28 NOTE — Anesthesia Procedure Notes (Signed)
Arterial Line Insertion Start/End6/20/2019 8:56 PM, 03/28/2018 8:57 PM Performed by: Audry Pili, MD, Clovis Cao, CRNA, CRNA  Preanesthetic checklist: patient identified, IV checked, surgical consent, monitors and equipment checked and pre-op evaluation Left, radial was placed Catheter size: 20 G Hand hygiene performed  and maximum sterile barriers used   Attempts: 1 Procedure performed without using ultrasound guided technique. Ultrasound Notes:anatomy identified Following insertion, Biopatch and dressing applied.

## 2018-03-28 NOTE — Anesthesia Postprocedure Evaluation (Signed)
Anesthesia Post Note  Patient: Barry Miranda  Procedure(s) Performed: CRANIECTOMY for cerebral edema with Placement of Skull Flap in Abdomen (Right Head)     Patient location during evaluation: ICU Anesthesia Type: General Level of consciousness: sedated Pain management: pain level controlled Vital Signs Assessment: post-procedure vital signs reviewed and stable Respiratory status: patient remains intubated per anesthesia plan Cardiovascular status: stable Postop Assessment: no apparent nausea or vomiting Anesthetic complications: no    Last Vitals:  Vitals:   03/28/18 1900 03/28/18 2000  BP: (!) 147/76 131/73  Pulse: 79 78  Resp: 14 12  Temp:    SpO2: 97% 97%    Last Pain:  Vitals:   03/28/18 2000  TempSrc:   PainSc: 2                  Audry Pili

## 2018-03-28 NOTE — Progress Notes (Addendum)
Repeat CTH reviewed, essentially unchanged from previous. There remains some hemispheric edema with R->L MLS of ~1cm. There is also some effacement of the ambient cistern. Also concern for possible SZ, spot EEG completed pending interpretation. - Will increase Keppra to 1000mg  BID - Will start hypertonic saline @ 1cc/kg/hr for goal Na 150-155 - Cont to monitor neurologic exam

## 2018-03-28 NOTE — Progress Notes (Signed)
Neurosurgery MD made aware of patient's worsening headache, and that multiple pain medications have been given. MD made aware that patient is still oriented, following commands, moving all extremities, and has equal and reactive pupils. No new orders received, will continue to monitor.

## 2018-03-28 NOTE — Op Note (Signed)
NEUROSURGERY OPERATIVE NOTE   PREOP DIAGNOSIS:  1. Intracranial hypertension 2. Right subdural hematoma   POSTOP DIAGNOSIS: Same  PROCEDURE: 1. Decompressive right frontoparietal craniectomy 2. Implantation of bone flap in subcutaneous pocket  SURGEON: Dr. Consuella Lose, MD  ASSISTANT: Ferne Reus, PA-C  ANESTHESIA: General Endotracheal  EBL: 200cc  SPECIMENS: None  DRAINS: None  COMPLICATIONS: None immediate  CONDITION: Hemodynamically stable to neuro ICU  HISTORY: Barry Miranda is a 57 y.o. male Who previously underwent who previously underwent operative evacuation of right sided subdural hematoma.  Approximately 24 hours later he required reoperation for evacuation of epidural hematoma.  He did well initially after the second operation, however over the last 36 hours has been progressively declining, although with a somewhat waxing and waning course.  Repeat CT scan has demonstrated continued approximately 12 mm right subdural hematoma, as well as approximately 11 mm of midline shift.  There is also effacement of the basal cisterns.  With his declining clinical exam and evidence of mass-effect on CT scan, decompressive craniectomy was indicated.  Other treatment options including continued observation with medical management and antiepileptic medications were discussed with patient's family however she did elect to proceed with decompressive craniectomy.  After all her questions were answered, informed consent was obtained and witnessed.  PROCEDURE IN DETAIL: The patient was brought to the operating room. After induction of general anesthesia, the patient was positioned on the operative table in the supine position. All pressure points were meticulously padded.  The right side of the scalp was then prepped and draped in the usual sterile fashion.  The right side of the abdomen was also prepped and draped.  After timeout was conducted, the previous skin incision was  opened with scissors by cutting galeal stitches.  Stitches along the temporalis fascia were also cut.  Raney clips were used for hemostasis on the skin edges.  The skin flap with the muscle was then reflected anteriorly.  The bone flap was then removed.  There was a relatively large subdural hematoma which was identified and removed with a combination of suction and blunt dissectors.  The brain surface was then inspected and no active bleeding was identified.  The brain surface did appear to be pulsatile, however it did not appear to be herniating out of the craniectomy defect.  I therefore elected not to extend the craniectomy flap.  After good hemostasis was achieved on the brain surface, the dura was then laid back over the brain surface.  Hemostasis on the epidural surface was achieved with a combination of bipolar electrocautery and morselized Gelfoam and thrombin.  The temporalis muscle was then reapproximated with 0 Vicryl stitches and the galea was reapproximated with interrupted 0 Vicryl and 3-0 Vicryl stitches.  Skin staples were then placed.  The bone flap was then implanted into the abdomen by creation of an incision in the right upper quadrant.  Bovie electrocautery was used to dissected subcutaneous tissue until the abdominal fascia was identified.  A subcutaneous pocket was then created.  The bone flap was placed.  Wound was then irrigated with saline.  The wound was then closed in multiple layers with interrupted 0 and 3-0 Vicryl stitches and the skin was closed with staples.  At the end of the case all sponge, needle, and instrument counts were correct.  Because of the need for likely continued hypertonic saline, a central line was placed by the anesthesia service.  The patient was then taken back to the neuro intensive care  unit for further care in stable hemodynamic condition.

## 2018-03-28 NOTE — Progress Notes (Signed)
Neuro Sx PA Lowella Fairy was notified that pt has increased lethargy, difficluty articulating words and is orientated X3, disorientated to place.    PA placed order for STAT head CT.  Pt went to CT and has returned to unit safely.    RN is continuing to monitor, Sherril Cong, RN

## 2018-03-29 ENCOUNTER — Inpatient Hospital Stay (HOSPITAL_COMMUNITY): Payer: 59

## 2018-03-29 ENCOUNTER — Encounter (HOSPITAL_COMMUNITY): Payer: Self-pay | Admitting: Neurosurgery

## 2018-03-29 DIAGNOSIS — S064X9A Epidural hemorrhage with loss of consciousness of unspecified duration, initial encounter: Secondary | ICD-10-CM

## 2018-03-29 LAB — BLOOD GAS, ARTERIAL
ACID-BASE EXCESS: 0.6 mmol/L (ref 0.0–2.0)
Bicarbonate: 24.6 mmol/L (ref 20.0–28.0)
Drawn by: 414221
FIO2: 40
O2 SAT: 98.7 %
PATIENT TEMPERATURE: 98.6
PEEP: 5 cmH2O
PH ART: 7.419 (ref 7.350–7.450)
PO2 ART: 170 mmHg — AB (ref 83.0–108.0)
RATE: 16 resp/min
VT: 480 mL
pCO2 arterial: 38.7 mmHg (ref 32.0–48.0)

## 2018-03-29 LAB — BASIC METABOLIC PANEL
Anion gap: 3 — ABNORMAL LOW (ref 5–15)
BUN: 11 mg/dL (ref 6–20)
CO2: 26 mmol/L (ref 22–32)
Calcium: 7.9 mg/dL — ABNORMAL LOW (ref 8.9–10.3)
Chloride: 118 mmol/L — ABNORMAL HIGH (ref 101–111)
Creatinine, Ser: 0.58 mg/dL — ABNORMAL LOW (ref 0.61–1.24)
GFR calc Af Amer: >60 mL/min (ref >60)
GFR calc non Af Amer: >60 mL/min (ref >60)
Glucose, Bld: 114 mg/dL — ABNORMAL HIGH (ref 65–99)
Potassium: 3.5 mmol/L (ref 3.5–5.1)
Sodium: 147 mmol/L — ABNORMAL HIGH (ref 135–145)

## 2018-03-29 LAB — CBC
HCT: 28.6 % — ABNORMAL LOW (ref 39.0–52.0)
Hemoglobin: 9.7 g/dL — ABNORMAL LOW (ref 13.0–17.0)
MCH: 30.8 pg (ref 26.0–34.0)
MCHC: 33.9 g/dL (ref 30.0–36.0)
MCV: 90.8 fL (ref 78.0–100.0)
Platelets: 226 10*3/uL (ref 150–400)
RBC: 3.15 MIL/uL — ABNORMAL LOW (ref 4.22–5.81)
RDW: 14 % (ref 11.5–15.5)
WBC: 9.8 10*3/uL (ref 4.0–10.5)

## 2018-03-29 LAB — SODIUM
SODIUM: 142 mmol/L (ref 135–145)
SODIUM: 146 mmol/L — AB (ref 135–145)
SODIUM: 147 mmol/L — AB (ref 135–145)

## 2018-03-29 LAB — MAGNESIUM: Magnesium: 2.3 mg/dL (ref 1.7–2.4)

## 2018-03-29 LAB — TRIGLYCERIDES: TRIGLYCERIDES: 193 mg/dL — AB (ref <150)

## 2018-03-29 LAB — PHOSPHORUS: Phosphorus: 2.4 mg/dL — ABNORMAL LOW (ref 2.5–4.6)

## 2018-03-29 MED ORDER — CHLORHEXIDINE GLUCONATE 0.12% ORAL RINSE (MEDLINE KIT)
15.0000 mL | Freq: Two times a day (BID) | OROMUCOSAL | Status: DC
  Administered 2018-03-29 – 2018-03-31 (×6): 15 mL via OROMUCOSAL

## 2018-03-29 MED ORDER — SODIUM CHLORIDE 3 % IV SOLN
INTRAVENOUS | Status: AC
  Administered 2018-03-30 – 2018-04-02 (×16): 100 mL/h via INTRAVENOUS
  Administered 2018-04-02 – 2018-04-03 (×2): 50 mL/h via INTRAVENOUS
  Filled 2018-03-29 (×22): qty 500

## 2018-03-29 MED ORDER — SODIUM CHLORIDE 3 % IV SOLN
INTRAVENOUS | Status: DC
  Filled 2018-03-29 (×3): qty 500

## 2018-03-29 MED ORDER — FENTANYL CITRATE (PF) 100 MCG/2ML IJ SOLN
25.0000 ug | INTRAMUSCULAR | Status: DC | PRN
  Administered 2018-03-30: 25 ug via INTRAVENOUS
  Administered 2018-03-31: 50 ug via INTRAVENOUS
  Administered 2018-03-31: 25 ug via INTRAVENOUS
  Administered 2018-04-01 (×2): 50 ug via INTRAVENOUS
  Filled 2018-03-29 (×6): qty 2

## 2018-03-29 MED ORDER — ORAL CARE MOUTH RINSE
15.0000 mL | OROMUCOSAL | Status: DC
  Administered 2018-03-29 (×3): 15 mL via OROMUCOSAL

## 2018-03-29 NOTE — Progress Notes (Signed)
  NEUROSURGERY PROGRESS NOTE   Pt seen and examined. No issues overnight after surgery.  EXAM: Temp:  [98.7 F (37.1 C)-100.1 F (37.8 C)] 99.1 F (37.3 C) (06/21 0800) Pulse Rate:  [61-85] 69 (06/21 0800) Resp:  [10-18] 16 (06/21 0800) BP: (114-191)/(68-114) 142/80 (06/21 0800) SpO2:  [97 %-100 %] 99 % (06/21 0800) Arterial Line BP: (171-236)/(70-86) 177/70 (06/21 0800) FiO2 (%):  [30 %-40 %] 30 % (06/21 0700) Intake/Output      06/20 0701 - 06/21 0700 06/21 0701 - 06/22 0700   P.O.     I.V. (mL/kg) 2594 (37.3)    IV Piggyback 220    Total Intake(mL/kg) 2814 (40.5)    Urine (mL/kg/hr) 3535 (2.1)    Blood 100    Total Output 3635    Net -821          Off propofol: Opens eyes Breathes over vent Follows commands symmetrically all extremities Wounds c/d/i  LABS: Lab Results  Component Value Date   CREATININE 0.58 (L) 03/29/2018   BUN 11 03/29/2018   NA 147 (H) 03/29/2018   K 3.5 03/29/2018   CL 118 (H) 03/29/2018   CO2 26 03/29/2018   Lab Results  Component Value Date   WBC 9.8 03/29/2018   HGB 9.7 (L) 03/29/2018   HCT 28.6 (L) 03/29/2018   MCV 90.8 03/29/2018   PLT 226 03/29/2018    IMPRESSION: - 57 y.o. male s/p decompressive craniectomy, appears to be at baseline - With waxing/waning exam still concern for SZ not recorded on spot EEG  PLAN: - Hopefully extubate this am per PCCM - Cont 3% saline @ 75cc/hr - Will discuss with neurology for continuous EEG. Cont Keppra 1000mg  Q12.

## 2018-03-29 NOTE — Progress Notes (Signed)
Occupational Therapy Treatment and RE-Evaluation  Patient Details Name: Barry Miranda MRN: [id number] DOB: August 01, 1961 Today's Date: 03/29/2018    History of present illness  57 year old male with history of hyperlipidemia, presenting to the ED with headache.  CT showing large R SDH in much of the frontotemporoparietal regions.  Pt s/p crani and drain placement. Pt with increased lethargy on 6/19; repeat CT on 6/20 with 1.35mm midline shift increase. He additionally underwent decompressive right frontoparietal craniectomy and implantation of bone flap 03/28/18.    OT comments  Pt seen post-op for OT re-eval. Pt requiring min 2 person HHA for short distance mobility in room; requiring max assist for ADL with min assist for self feeding. Pt seems to have slower processing; requiring increased time and cues to follow commands but could potentially be lethargy impacting the speed at which he is completing tasks. Updated d/c recommendations to CIR for follow up. Goals remain appropriate. Will continue to follow acutely.   Follow Up Recommendations  CIR;Supervision/Assistance - 24 hour    Equipment Recommendations  Other (comment)(TBD)    Recommendations for Other Services      Precautions / Restrictions Precautions Precautions: Fall;Other (comment) Precaution Comments: R craniectomy with bone flap removed       Mobility Bed Mobility Overal bed mobility: Needs Assistance Bed Mobility: Supine to Sit     Supine to sit: Mod assist     General bed mobility comments: cues for sequencing, assist with BLE and to elevate trunk  Transfers Overall transfer level: Needs assistance Equipment used: 2 person hand held assist Transfers: Sit to/from Stand Sit to Stand: Min assist         General transfer comment: cues for sequencing    Balance Overall balance assessment: Needs assistance Sitting-balance support: Feet supported Sitting balance-Leahy Scale: Fair Sitting balance - Comments:  Min guard throughout sitting   Standing balance support: Single extremity supported Standing balance-Leahy Scale: Poor Standing balance comment: Min HHA for standing balance                           ADL either performed or assessed with clinical judgement   ADL Overall ADL's : Needs assistance/impaired Eating/Feeding: Minimal assistance;Sitting Eating/Feeding Details (indicate cue type and reason): for ice chips and suction, cues for initiation                 Lower Body Dressing: Maximal assistance;Sit to/from stand Lower Body Dressing Details (indicate cue type and reason): to don socks Toilet Transfer: Minimal assistance;+2 for physical assistance;Stand-pivot Toilet Transfer Details (indicate cue type and reason): simulated by stand pivot EOB to chair. Pt able to stand with min assist to void in condom cath         Functional mobility during ADLs: Minimal assistance;+2 for physical assistance(HHA)       Vision   Additional Comments: Needs further assessment   Perception     Praxis      Cognition Arousal/Alertness: Lethargic Behavior During Therapy: Flat affect Overall Cognitive Status: Impaired/Different from baseline Area of Impairment: Attention;Following commands;Problem solving                   Current Attention Level: Sustained   Following Commands: Follows one step commands with increased time     Problem Solving: Slow processing;Decreased initiation General Comments: Minimally verbal, keeping eyes closed. Just extubated this AM.        Exercises     Shoulder Instructions  General Comments wife present and very supportive    Pertinent Vitals/ Pain       Pain Assessment: Faces Faces Pain Scale: No hurt Pain Location: flat affect, no s/s of pain noted  Home Living                                          Prior Functioning/Environment              Frequency  Min 3X/week         Progress Toward Goals  OT Goals(current goals can now be found in the care plan section)  Progress towards OT goals: Not progressing toward goals - comment(lethargy, post-op)  Acute Rehab OT Goals Patient Stated Goal: home per wife OT Goal Formulation: With patient/family  Plan Discharge plan needs to be updated;Frequency needs to be updated    Co-evaluation    PT/OT/SLP Co-Evaluation/Treatment: Yes Reason for Co-Treatment: For patient/therapist safety PT goals addressed during session: Mobility/safety with mobility;Balance OT goals addressed during session: ADL's and self-care      AM-PAC PT "6 Clicks" Daily Activity     Outcome Measure   Help from another person eating meals?: A Little Help from another person taking care of personal grooming?: A Little Help from another person toileting, which includes using toliet, bedpan, or urinal?: A Lot Help from another person bathing (including washing, rinsing, drying)?: A Lot Help from another person to put on and taking off regular upper body clothing?: A Lot Help from another person to put on and taking off regular lower body clothing?: A Lot 6 Click Score: 14    End of Session Equipment Utilized During Treatment: Gait belt;Oxygen  OT Visit Diagnosis: Unsteadiness on feet (R26.81);Pain Pain - part of body: (head)   Activity Tolerance Patient tolerated treatment well;Patient limited by lethargy   Patient Left in chair;with call bell/phone within reach;with chair alarm set;with family/visitor present   Nurse Communication Mobility status        Time: 1351-1410 OT Time Calculation (min): 19 min  Charges: OT General Charges $OT Visit: 1 Visit OT Evaluation $OT Re-eval: 1 Re-eval  Dayonna Selbe A. Ulice Brilliant, M.S., OTR/L Acute Rehab Department: [phone]   Binnie Kand 03/29/2018, 4:37 PM

## 2018-03-29 NOTE — Progress Notes (Signed)
OT Cancellation Note  Patient Details Name: DAYVIAN BLIXT MRN: [id number] DOB: 03/30/1961   Cancelled Treatment:    Reason Eval/Treat Not Completed: Medical issues which prohibited therapy. Pt remains intubated and sedated post-op. Possible extubation today. Will follow up as time allows.  Earlean Shawl A. Ulice Brilliant, M.S., OTR/L Acute Rehab Department: [phone]  03/29/2018, 10:10 AM

## 2018-03-29 NOTE — Procedures (Signed)
Extubation Procedure Note  Patient Details:   Name: Barry Miranda DOB: 09/05/1961 MRN: [id number]   Airway Documentation:    Vent end date: 03/29/18 Vent end time: 1003   Evaluation  O2 sats: stable throughout Complications: No apparent complications Patient did tolerate procedure well. Bilateral Breath Sounds: Clear, Diminished   Yes   Pt extubated to nasal cannula 2Lpm at 1003. Pt is sating 100%. RT will continue to monitor.   Vernona Rieger 03/29/2018, 10:21 AM

## 2018-03-29 NOTE — Progress Notes (Signed)
PULMONARY / CRITICAL CARE MEDICINE   Name: CHARLOTTE BRAFFORD MRN: [id number] DOB: January 05, 1961    ADMISSION DATE:  03/23/2018 CONSULTATION DATE:  03/25/2018  REFERRING MD:  Dr. Vertell Limber   CHIEF COMPLAINT:  Intubated Post-Op  HISTORY OF PRESENT ILLNESS:   57 year old male with PMH of HLD  Presents to ED on 6/16 with headache with nausea/vomiting. Patient with left hemiparesis. CT head with large hemispheric SDH on the right. Taken to OR for craniotomy with evacuation. On 6/16 at 2030 patient with lethargy. Taken to CT which revealed large acute epidural hematoma under the bone flap with significant mass-effect . Taken back to OR for evacuation. Remained intubated in post-operative period. PCCM consulted.   Extubated 6/17 and tolerated well. Head CT 6/18 with slightly more edema.  More lethargic with slurring of speech 6/20.  Had further deterioration that day with change in mental status.  CT head obtained but essentially unchanged.  Keppra increased and hypertonic saline started.  Given continued decline, decision made to proceed with right decompressive craniectomy.  Evening 6/20, taken to OR then returned to ICU on vent and PCCM called back to assist with vent management.  SUBJECTIVE:   Remains on the ventilator and 3% saline.  VITAL SIGNS: BP (!) 142/80   Pulse 71   Temp 99.1 F (37.3 C) (Axillary)   Resp 16   Ht 5\' 4"  (1.626 m)   Wt 153 lb 3.5 oz (69.5 kg)   SpO2 100%   BMI 26.30 kg/m   HEMODYNAMICS:    VENTILATOR SETTINGS: Vent Mode: PRVC FiO2 (%):  [30 %-40 %] 30 % Set Rate:  [12 bmp-16 bmp] 16 bmp Vt Set:  [480 mL] 480 mL PEEP:  [5 cmH20] 5 cmH20 Plateau Pressure:  [13 cmH20-15 cmH20] 13 cmH20  INTAKE / OUTPUT: I/O last 3 completed shifts: In: 3844.9 [I.V.:3474.9; IV Piggyback:370] Out: 2409 [Urine:5555; Blood:100]  PHYSICAL EXAMINATION: Gen:      No acute distress HEENT:  EOMI, sclera anicteric, ETT tube Neck:     No masses; no thyromegaly Lungs:    Clear to  auscultation bilaterally; normal respiratory effort CV:         Regular rate and rhythm; no murmurs Abd:      + bowel sounds; soft, non-tender; no palpable masses, no distension Ext:    No edema; adequate peripheral perfusion Skin:      Warm and dry; no rash Neuro: Sedated, non responsive   LABS:  BMET Recent Labs  Lab 03/26/18 0332 03/28/18 1412 03/28/18 1711 03/28/18 2333 03/29/18 0349  NA 142 139 136 142 147*  K 3.4* 3.6  --   --  3.5  CL 110 105  --   --  118*  CO2 25 26  --   --  26  BUN 6 7  --   --  11  CREATININE 0.68 0.61  --   --  0.58*  GLUCOSE 125* 136*  --   --  114*    Electrolytes Recent Labs  Lab 03/25/18 0348 03/26/18 0332 03/28/18 1412 03/29/18 0349  CALCIUM 7.6* 8.3* 8.8* 7.9*  MG 1.9 2.4  --  2.3  PHOS 3.2 1.9*  --  2.4*    CBC Recent Labs  Lab 03/26/18 0332 03/28/18 1412 03/29/18 0349  WBC 9.8 13.2* 9.8  HGB 11.3* 11.6* 9.7*  HCT 33.0* 33.9* 28.6*  PLT 183 281 226    Coag's Recent Labs  Lab 03/24/18 0345  APTT 33  INR 0.86  Sepsis Markers No results for input(s): LATICACIDVEN, PROCALCITON, O2SATVEN in the last 168 hours.  ABG Recent Labs  Lab 03/24/18 0638 03/25/18 0155 03/28/18 0035  PHART 7.347* 7.370 7.419  PCO2ART 38.4 41.4 38.7  PO2ART 470.0* 218* 170*    Liver Enzymes Recent Labs  Lab 03/24/18 0345  AST 23  ALT 27  ALKPHOS 59  BILITOT 0.5  ALBUMIN 5.0    Cardiac Enzymes No results for input(s): TROPONINI, PROBNP in the last 168 hours.  Glucose No results for input(s): GLUCAP in the last 168 hours.  Imaging Ct Head Wo Contrast  Result Date: 03/28/2018 CLINICAL DATA:  Altered level of consciousness, lethargy EXAM: CT HEAD WITHOUT CONTRAST TECHNIQUE: Contiguous axial images were obtained from the base of the skull through the vertex without intravenous contrast. COMPARISON:  CT brain scan 03/28/2017 and 03/26/2017 FINDINGS: Brain: There is little change in the mixed density right subdural hematoma in  the frontotemporal region with some acute component which is stable. Hemorrhagic contusion in the right temporal region is stable as well. No new area of hemorrhage is seen. Midline shift now measures 10 mm compared to 11.5 mm previously. Effacement of right cortical sulci and some narrowing of the ventricular system remains unchanged. Old left basal ganglial lacunar infarct is stable. The fourth ventricle and basilar cisterns are stable. A small amount of pneumocephalus noted after craniotomy for subdural hematoma evacuation. Vascular: No vascular abnormality is noted on this unenhanced study. Skull: On bone window images, changes of right craniotomy are noted. There is soft tissue swelling over the craniotomy site. Small amount of pneumocephaly is present. Sinuses/Orbits: The paranasal sinuses appear well pneumatized air Other: None. IMPRESSION: 1. Stable mixed density right subdural hematoma with some decreased shift of mediastinum to the left from 11.5 mm to 10 mm currently. 2. No change in hemorrhagic in the right temporal region. Electronically Signed   By: Ivar Drape M.D.   On: 03/28/2018 15:40   Ct Head Wo Contrast  Result Date: 03/28/2018 CLINICAL DATA:  Subdural hematoma. Increased lethargy and slurred speech today. EXAM: CT HEAD WITHOUT CONTRAST TECHNIQUE: Contiguous axial images were obtained from the base of the skull through the vertex without intravenous contrast. COMPARISON:  CT head without contrast 03/26/2018 and 03/24/2018. FINDINGS: Brain: A right extensive E subdural hematoma is again seen without significant interval change. Hemorrhagic contusion in the right temporal lobe is again seen. No new areas of hemorrhage are present. Midline shift has increased since the prior exam, now measuring 11.5 mm. There is mass effect with effacement of the sulci bilaterally. Mild prominence of the left temporal tip is again noted. The right lateral ventricle remains effaced. Stable blood products are  noted over the tentorium. Lacunar infarct is again noted within the left basal ganglia. No acute cortical infarct is present. Vascular: Atherosclerotic calcifications are present along the cavernous right internal carotid artery. No hyperdense vessel is present. Skull: Right craniotomy is again noted. Extracranial hematoma is similar to the prior exam. Some gas remains external to the craniotomy following removal of the surgical drain. Sinuses/Orbits: The globes and orbits are within normal limits. The paranasal sinuses and mastoid air cells are clear. IMPRESSION: 1. Mixed density right subdural hematoma is not significantly changed in size. It measures up to 12 mm maximally on the coronal images. 2. No new hemorrhage. Midline shift has increased from 10 mm to 11.5 mm. 3. Mass effect with effacement of the sulci and right lateral ventricle. There is slight prominence of the  left temporal tip without significant interval change. 4. Remote lacunar infarct of the left basal ganglia versus choroidal fissure cyst. Electronically Signed   By: San Morelle M.D.   On: 03/28/2018 08:53   Dg Chest Port 1 View  Result Date: 03/29/2018 CLINICAL DATA:  Line placement EXAM: PORTABLE CHEST 1 VIEW COMPARISON:  03/25/2018, 11/29/2011 FINDINGS: Endotracheal tube tip is about 2 cm superior to the carina. Esophageal tube has been removed. New right IJ central venous catheter with tip projecting over the SVC. No pneumothorax. Partial consolidation at the medial left lung base. No pleural effusion. Normal heart size. IMPRESSION: 1. Removal of esophageal tube 2. Endotracheal tube tip about 2 cm superior to the carina 3. New right IJ central venous catheter with tip overlying SVC, no pneumothorax 4. Partial atelectasis or infiltrate at the left base. Electronically Signed   By: Donavan Foil M.D.   On: 03/29/2018 00:24     STUDIES:  CT Head 6/16 > 1. Acute 1 cm RIGHT holo hemispheric subdural hematoma resulting 1 cm  RIGHT-to-LEFT subfalcine herniation with LEFT lateral ventricle entrapment. 2. Trace acute falcotentorial subdural hematoma. CT Head 6/16 > 1. Interval right lateral craniotomy, placement of extra-axial drain, partial evacuation of subdural hematoma. 2. Acute extra-axial hemorrhage over the right convexity is now concentrated subjacent to the craniotomy and increased in thickness when compared with the preoperative CT which may represent postoperative change and/or interval hemorrhage. 3. New acute 15 mm intra-axial hemorrhage in right posterior temporal lobe. 4. Mass effect with stable 10 mm right-to-left midline shift and mild left ventricular entrapment. CT head 6/17 > decreased right SDH with similar small falcotentorial SDH.  58mm residual R to L MLS which is decreased from 78mm, resolving left ventricular entrapment, evolving 19mm right temporal lobe hematoma. CT head 6/18 > mildly increased mass effect, otherwise stable. CT head 6/20 > no significant change. EEG 6/20 > generalized cerebral dysfunction.  CULTURES: MRSA 6/16 > Negative  STaph 6/16 > Negative   ANTIBIOTICS: Ancef 6/20 > for surgical prophylaxis  SIGNIFICANT EVENTS: 6/16 > Presents to ED  6/17 > extubated. 6/20 > back to OR for right decompressive craniectomy.  LINES/TUBES: ETT 6/16 > 6/17.  6/20 >   DISCUSSION: 57 year old male presents to ED on 6/16 with Right SDH. Underwent evacuation. On 6/16 2030 patient with lethargy. CT Head with large acute epidural hematoma. Taken to OR for Craniotomy with evacuation.  Extubated 6/17 and tolerated well. Had change in mental status 6/18 and worsened 6/20 so taken back to OR for right decompressive craniectomy.  PCCM called back for vent management.  ASSESSMENT / PLAN:  Respiratory Insufficieny in post-operative setting  Plan  Start SBTs Wake-up assessments.  HTN H/O HLD  Plan  Cardiac monitoring Continue hydralazine and labetalol as needed Continue  Zocor.  SUP Nutrition Plan Keep n.p.o., Pepcid.  Right SDH s/p Evacuation. Large Acute Epidural Hematoma s/p Evacuation and Craniotomy  S/p right decompressive craniectomy 6/20 after worsening mental status Plan  Neurosurgery managing / following Sedation: Stop sedation and wake up. Continue Keppra, 3% saline.  FAMILY  - Updates: Wife updated at bedside 6/21 - Inter-disciplinary family meet or Palliative Care meeting due by:  04/01/2018   The patient is critically ill with multiple organ system failure and requires high complexity decision making for assessment and support, frequent evaluation and titration of therapies, advanced monitoring, review of radiographic studies and interpretation of complex data.   Critical Care Time devoted to patient care services, exclusive  of separately billable procedures, described in this note is 35 minutes.   Marshell Garfinkel MD [facility] Pager [phone] If no answer or after 3pm call: [phone] 03/29/2018, 9:14 AM

## 2018-03-29 NOTE — Progress Notes (Signed)
PT Cancellation Note  Patient Details Name: Barry Miranda MRN: [id number] DOB: 1961/06/07   Cancelled Treatment:    Reason Eval/Treat Not Completed: Medical issues which prohibited therapy. Pt remains intubated following 03/28/18 procedure. Plans for possible extubation this AM.   Lorriane Shire 03/29/2018, 9:09 AM   Lorrin Goodell, PT  Office # [phone] Pager [phone]

## 2018-03-29 NOTE — Progress Notes (Signed)
Rehab Admissions Coordinator Note:  Patient was screened by Cleatrice Burke for appropriateness for an Inpatient Acute Rehab Consult per PT recommendation.  At this time, we are recommending Inpatient Rehab consult if you would like pt considered for inpt rehab admit. Please advise/  Danne Baxter, RN, MSN Rehab Admissions Coordinator [phone] 03/29/2018 4:03 PM

## 2018-03-29 NOTE — Progress Notes (Signed)
Pulled back ETT 1cm per MD Scatliffe, ETT is now 23 at the lips

## 2018-03-29 NOTE — Progress Notes (Signed)
Spoke to pharmacy about change in keppra orders and time of administration. Pharmacy agreed that keppra should be given and not delayed until later in the AM. Keppra dose I was supposed to give earlier in the shift was unable to be given due to patient being in the OR. Will continue to monitor.

## 2018-03-29 NOTE — Progress Notes (Signed)
Physical Therapy Re-Evaluation Patient Details Name: Barry Miranda MRN: [id number] DOB: 12-10-1960 Today's Date: 03/29/2018    History of Present Illness  57 year old male with history of hyperlipidemia, presenting to the ED with headache.  CT showing large R SDH in much of the frontotemporoparietal regions.  Pt s/p crani and drain placement. Pt with increased lethargy on 6/19; repeat CT on 6/20 with 1.61mm midline shift increase. He additionally underwent decompressive right frontoparietal craniectomy and implantation of bone flap 03/28/18.     PT Comments    PT re-evaluation completed due to change in neuro status and decline in functional mobility. Pt underwent decompressive R craniectomy yesterday, 03/28/18. Pt just extubated this AM. He is groggy and lethargic at time of assessment, keeping eyes closed unless cued to open. Pt required mod assist bed mobility, +2 min assist sit to stand, and +2 min assist ambulation 5 feet HHA. Pt following one-step commands with increased time.  Discharge recommendations updated to CIR. Goals reviewed and updated.   Follow Up Recommendations  CIR     Equipment Recommendations  Other (comment)(TBD)    Recommendations for Other Services Rehab consult     Precautions / Restrictions Precautions Precautions: Fall;Other (comment) Precaution Comments: R craniectomy with bone flap removed    Mobility  Bed Mobility Overal bed mobility: Needs Assistance Bed Mobility: Supine to Sit     Supine to sit: Mod assist     General bed mobility comments: cues for sequencing, assist with BLE and to elevate trunk  Transfers Overall transfer level: Needs assistance Equipment used: 2 person hand held assist Transfers: Sit to/from Stand Sit to Stand: Min assist         General transfer comment: cues for sequencing  Ambulation/Gait Ambulation/Gait assistance: Min assist;+2 physical assistance Gait Distance (Feet): 5 Feet Assistive device: 2 person hand  held assist Gait Pattern/deviations: Step-through pattern;Decreased stride length Gait velocity: decreased Gait velocity interpretation: <1.8 ft/sec, indicate of risk for recurrent falls General Gait Details: Pt able to ambulate bed to recliner. Slow, guarded gait.   Stairs             Wheelchair Mobility    Modified Rankin (Stroke Patients Only) Modified Rankin (Stroke Patients Only) Pre-Morbid Rankin Score: No symptoms Modified Rankin: Moderately severe disability     Balance Overall balance assessment: Needs assistance Sitting-balance support: Feet supported Sitting balance-Leahy Scale: Fair Sitting balance - Comments: Min guard throughout sitting   Standing balance support: Single extremity supported Standing balance-Leahy Scale: Poor Standing balance comment: Min HHA for standing balance                            Cognition Arousal/Alertness: Lethargic Behavior During Therapy: Flat affect Overall Cognitive Status: Impaired/Different from baseline Area of Impairment: Attention;Following commands;Problem solving                   Current Attention Level: Sustained   Following Commands: Follows one step commands with increased time     Problem Solving: Slow processing;Decreased initiation General Comments: Minimally verbal, keeping eyes closed. Just extubated this AM.      Exercises      General Comments General comments (skin integrity, edema, etc.): wife present and very supportive      Pertinent Vitals/Pain Pain Assessment: Faces Faces Pain Scale: No hurt Pain Location: flat affect, no s/s of pain noted    Home Living  Prior Function            PT Goals (current goals can now be found in the care plan section) Acute Rehab PT Goals Patient Stated Goal: home per wife PT Goal Formulation: With patient/family Time For Goal Achievement: 04/12/18 Potential to Achieve Goals: Good Progress towards  PT goals: Goals downgraded-see care plan    Frequency    Min 4X/week      PT Plan Discharge plan needs to be updated;Frequency needs to be updated    Co-evaluation PT/OT/SLP Co-Evaluation/Treatment: Yes Reason for Co-Treatment: For patient/therapist safety PT goals addressed during session: Mobility/safety with mobility;Balance        AM-PAC PT "6 Clicks" Daily Activity  Outcome Measure  Difficulty turning over in bed (including adjusting bedclothes, sheets and blankets)?: Unable Difficulty moving from lying on back to sitting on the side of the bed? : Unable Difficulty sitting down on and standing up from a chair with arms (e.g., wheelchair, bedside commode, etc,.)?: Unable Help needed moving to and from a bed to chair (including a wheelchair)?: A Little Help needed walking in hospital room?: A Lot Help needed climbing 3-5 steps with a railing? : A Lot 6 Click Score: 10    End of Session Equipment Utilized During Treatment: Gait belt Activity Tolerance: Patient limited by lethargy Patient left: in chair;with call bell/phone within reach;with family/visitor present;with chair alarm set Nurse Communication: Mobility status PT Visit Diagnosis: Unsteadiness on feet (R26.81);Pain;Difficulty in walking, not elsewhere classified (R26.2)     Time: 1601-0932 PT Time Calculation (min) (ACUTE ONLY): 30 min  Charges:                       G Codes:       Lorrin Goodell, PT  Office # [phone] Pager [phone]    Lorriane Shire 03/29/2018, 2:45 PM

## 2018-03-29 NOTE — Consult Note (Addendum)
Neurology Consultation Reason for Consult: Possible Seizures Referring Physician: Dr Kathyrn Sheriff    History is obtained from: from Nurse, wife  HPI: Barry Miranda is a 57 y.o. male who presented to the emergency department on 6/16 headache and vomiting and noted to have a large spontaneous subdural hemorrhage in the right hemisphere.  Patient taken to the OR for craniotomy and evacuation, later in the night found to have a large epidural hematoma on CT.  Taken back to the OR for evacuation.  He was extubated on 6/17 noted to have deterioration in mental status.  CT was unchanged at this time.  Patient was started on Keppra as well as hypertonic saline and taken back to the OR for decompressive craniectomy.  Neurology was consulted due to concern his decline was from nonconvulsive seizures.  Patient underwent a spot EEG yesterday which was negative.  Requested to have a 24-hour EEG today.  However since yesterday, the patient has improved neurologically according to his nurse and is alert oriented x3 and following commands and  has remained this way since this morning. No seizure like activity was noted.      ROS:  ROS was performed and is negative except as noted in the HPI  Past Medical History:  Diagnosis Date  . Hyperlipidemia      Family History  Problem Relation Age of Onset  . Hypertension Mother   . Diabetes Mother      Social History:  reports that he has been smoking.  He has been smoking about 0.15 packs per day. He has never used smokeless tobacco. He reports that he drinks about 4.2 oz of alcohol per week. He reports that he does not use drugs.   Exam: Current vital signs: BP (!) 157/86   Pulse 70   Temp 100 F (37.8 C) (Oral)   Resp 10   Ht 5\' 4"  (1.626 m)   Wt 69.5 kg (153 lb 3.5 oz)   SpO2 98%   BMI 26.30 kg/m  Vital signs in last 24 hours: Temp:  [98.7 F (37.1 C)-100.1 F (37.8 C)] 100 F (37.8 C) (06/21 1200) Pulse Rate:  [61-95] 70 (06/21  1230) Resp:  [10-18] 10 (06/21 1230) BP: (114-179)/(73-100) 157/86 (06/21 1230) SpO2:  [97 %-100 %] 98 % (06/21 1230) Arterial Line BP: (171-246)/(70-94) 217/82 (06/21 1230) FiO2 (%):  [30 %-40 %] 30 % (06/21 0907)   Physical Exam  Constitutional: Appears well-developed and well-nourished.  Psych: Affect appropriate to situation Eyes: No scleral injection HENT: R craniectomy scar Head: Normocephalic.  Cardiovascular: Normal rate and regular rhythm.  Respiratory: Effort normal, non-labored breathing GI: Soft.  No distension. There is no tenderness.  Skin: WDI  Neuro: Mental Status: Patient is awake, alert, oriented to person, place, month, year, and situation. No signs of aphasia or neglect Cranial Nerves: II: Visual Fields are full. Pupils are equal, round, and reactive to light.   III,IV, VI: EOMI without ptosis or diploplia.  V: Facial sensation is symmetric to temperature VII: Facial movement is symmetric.  VIII: hearing is intact to voice X: Uvula elevates symmetrically XI: Shoulder shrug is symmetric. XII: tongue is midline without atrophy or fasciculations.  Motor: Tone is normal. Bulk is normal. 5/5 strength was present in all four extremities. Sensory: Sensation is symmetric to light touch and temperature in the arms and legs. Deep Tendon Reflexes: 2+ and symmetric in the biceps and patellae.  Plantars: Toes are downgoing bilaterally.  Cerebellar: FNF and HKS are intact bilaterally  I have reviewed labs in epic and the results pertinent to this consultation   I have reviewed the images obtained: Multiple CT has from 6/16 to last one performed on 6/20  EEG impression:  The EEG is abnormal and findings are consistent with generalized and focal left cerebral dysfunction suggesting underlying left hemispheric structural defect. Epileptiform features were not seen during this recording.     ASSESSMENT AND PLAN Barry Miranda is a 57 y.o. Male with  spontanous R SDH and epidural hematoma.Had decrease in mental status prompting R hemicraniectomy. My suspicion is likely due to cerebral edema despite no significant worsening of midline shift, now has improved following hemicraniectomy. Seizures a possibility, agree with Keppra. Since no recent fluctationin exam and low suspicion for NCSE, will not need a cEEG at this time.    R hemisphere SDH and Epidural hematoma s/p R Hemicraniectomy with midline shift  Concern for non convulsive seizures  Recommendations No need for continuous EEG at this time as patient no longer having fluctuating mental status Continue Keppra 1g BID Continue hypertonic saline for cerebral edema Close monitoring, if patient has an episode of alteration in mental status then please call us back, will get cEEG.

## 2018-03-29 NOTE — Progress Notes (Signed)
Pt BP requiring many prns to keep in goal range. PA Costella paged. Await callback.

## 2018-03-30 DIAGNOSIS — R401 Stupor: Secondary | ICD-10-CM

## 2018-03-30 DIAGNOSIS — G9340 Encephalopathy, unspecified: Secondary | ICD-10-CM

## 2018-03-30 DIAGNOSIS — G936 Cerebral edema: Secondary | ICD-10-CM

## 2018-03-30 DIAGNOSIS — G935 Compression of brain: Secondary | ICD-10-CM

## 2018-03-30 LAB — SODIUM
SODIUM: 141 mmol/L (ref 135–145)
SODIUM: 146 mmol/L — AB (ref 135–145)
Sodium: 146 mmol/L — ABNORMAL HIGH (ref 135–145)
Sodium: 145 mmol/L (ref 135–145)

## 2018-03-30 MED ORDER — CHLORHEXIDINE GLUCONATE 0.12 % MT SOLN
OROMUCOSAL | Status: AC
  Filled 2018-03-30: qty 15

## 2018-03-30 NOTE — Progress Notes (Signed)
SLP Cancellation Note  Patient Details Name: Barry Miranda MRN: [id number] DOB: 23-May-1961   Cancelled treatment:       Reason Eval/Treat Not Completed: Fatigue/lethargy limiting ability to participate. D/w RN who requests holding swallow eval. Will continue efforts. RN to page if alert.  Deneise Lever, [place], CCC-SLP Speech-Language Pathologist [phone]    Barry Miranda 03/30/2018, 9:09 AM

## 2018-03-30 NOTE — Progress Notes (Signed)
Physical Therapy Treatment Patient Details Name: Barry Miranda MRN: [id number] DOB: June 25, 1961 Today's Date: 03/30/2018    History of Present Illness  57 year old male with history of hyperlipidemia, presenting to the ED with headache.  CT showing large R SDH in much of the frontotemporoparietal regions.  Pt s/p crani and drain placement. Pt with increased lethargy on 6/19; repeat CT on 6/20 with 1.31mm midline shift increase. He additionally underwent decompressive right frontoparietal craniectomy and implantation of bone flap 03/28/18.     PT Comments    Pt admitted with above diagnosis. Pt currently with functional limitations due to the deficits listed below (see PT Problem List). Pt was able to sit EOB for up to 5 min with min guard assist.  Limited progression due to BP was 171/74 initially and then to 197/79 in sitting.  Nurse gave BP med.  BP on departure was 175/78.  Will continue acute PT.   Pt will benefit from skilled PT to increase their independence and safety with mobility to allow discharge to the venue listed below.     Follow Up Recommendations  CIR     Equipment Recommendations  Other (comment)(TBD)    Recommendations for Other Services Rehab consult     Precautions / Restrictions Precautions Precautions: Fall;Other (comment) Precaution Comments: R craniectomy with bone flap removed Restrictions Weight Bearing Restrictions: No    Mobility  Bed Mobility Overal bed mobility: Needs Assistance Bed Mobility: Supine to Sit     Supine to sit: Mod assist Sit to supine: Min assist   General bed mobility comments: cues for sequencing, assist with BLE and to elevate trunk  Transfers Overall transfer level: Needs assistance Equipment used: 2 person hand held assist Transfers: Sit to/from Stand Sit to Stand: Min assist         General transfer comment: cues for sequencing  Ambulation/Gait Ambulation/Gait assistance: Min assist Gait Distance (Feet): 2  Feet Assistive device: 2 person hand held assist   Gait velocity: decreased Gait velocity interpretation: <1.31 ft/sec, indicative of household ambulator General Gait Details: Side step to left to [facility].  Nursing did not want pt OOB.  BP was elevated and incr with sitting up therefore sat EOB, stood briefly and then laid pt back down.    Stairs             Wheelchair Mobility    Modified Rankin (Stroke Patients Only) Modified Rankin (Stroke Patients Only) Pre-Morbid Rankin Score: No symptoms Modified Rankin: Moderately severe disability     Balance Overall balance assessment: Needs assistance Sitting-balance support: Feet supported Sitting balance-Leahy Scale: Fair Sitting balance - Comments: Min guard throughout sitting   Standing balance support: Bilateral upper extremity supported;During functional activity Standing balance-Leahy Scale: Poor Standing balance comment: Min HHA for standing balance                            Cognition Arousal/Alertness: Lethargic Behavior During Therapy: Flat affect Overall Cognitive Status: Impaired/Different from baseline Area of Impairment: Attention;Following commands;Problem solving                   Current Attention Level: Sustained   Following Commands: Follows one step commands with increased time     Problem Solving: Slow processing;Decreased initiation;Requires verbal cues General Comments: Minimally verbal, keeping eyes closed.       Exercises General Exercises - Lower Extremity Ankle Circles/Pumps: AROM;Both;10 reps;Seated Long Arc Quad: AROM;Both;10 reps;Seated    General Comments  General comments (skin integrity, edema, etc.): wife present and supportive      Pertinent Vitals/Pain Pain Assessment: Faces Faces Pain Scale: Hurts even more Pain Location: head Pain Descriptors / Indicators: Headache;Guarding Pain Intervention(s): Limited activity within patient's tolerance;Monitored during  session;Repositioned    Home Living                      Prior Function            PT Goals (current goals can now be found in the care plan section) Acute Rehab PT Goals Patient Stated Goal: home per wife Progress towards PT goals: Not progressing toward goals - comment(limited by high BP)    Frequency    Min 4X/week      PT Plan Current plan remains appropriate    Co-evaluation              AM-PAC PT "6 Clicks" Daily Activity  Outcome Measure  Difficulty turning over in bed (including adjusting bedclothes, sheets and blankets)?: Unable Difficulty moving from lying on back to sitting on the side of the bed? : Unable Difficulty sitting down on and standing up from a chair with arms (e.g., wheelchair, bedside commode, etc,.)?: A Lot Help needed moving to and from a bed to chair (including a wheelchair)?: A Lot Help needed walking in hospital room?: Total Help needed climbing 3-5 steps with a railing? : Total 6 Click Score: 8    End of Session Equipment Utilized During Treatment: Gait belt Activity Tolerance: Patient limited by lethargy;Patient limited by fatigue Patient left: with call bell/phone within reach;with family/visitor present;in bed Nurse Communication: Mobility status PT Visit Diagnosis: Unsteadiness on feet (R26.81);Pain;Difficulty in walking, not elsewhere classified (R26.2) Pain - part of body: (head)     Time: 2683-4196 PT Time Calculation (min) (ACUTE ONLY): 16 min  Charges:  $Therapeutic Activity: 8-22 mins                    G Codes:       Tamina Cyphers,PT Acute Rehabilitation [phone] (pager)    Denice Paradise 03/30/2018, 12:35 PM

## 2018-03-30 NOTE — Progress Notes (Signed)
PULMONARY / CRITICAL CARE MEDICINE   Name: Barry Miranda MRN: [id number] DOB: 28-May-1961    ADMISSION DATE:  03/23/2018 CONSULTATION DATE:  03/25/2018  REFERRING MD:  Dr. Vertell Limber   CHIEF COMPLAINT:  Intubated Post-Op  HISTORY OF PRESENT ILLNESS:   57 year old male with PMH of HLD  Presents to ED on 6/16 with headache with nausea/vomiting. Patient with left hemiparesis. CT head with large hemispheric SDH on the right. Taken to OR for craniotomy with evacuation. On 6/16 at 2030 patient with lethargy. Taken to CT which revealed large acute epidural hematoma under the bone flap with significant mass-effect . Taken back to OR for evacuation. Remained intubated in post-operative period. PCCM consulted.    SUBJECTIVE:  Extubated 6/17 and tolerated well. Head CT 6/18 with slightly more edema.  More lethargic with slurring of speech 6/20.  Had further deterioration that day with change in mental status.  CT head obtained but essentially unchanged.  Keppra increased and hypertonic saline started.  Given continued decline, decision made to proceed with right decompressive craniectomy.  Evening 6/20, taken to OR then returned to ICU on vent and PCCM called back to assist with vent management. Re-extubated 6/21 and has been stable since.   VITAL SIGNS: BP (!) 158/79   Pulse 74   Temp 99.6 F (37.6 C) (Axillary)   Resp 16   Ht 5\' 4"  (1.626 m)   Wt 69.5 kg (153 lb 3.5 oz)   SpO2 100%   BMI 26.30 kg/m   HEMODYNAMICS:    VENTILATOR SETTINGS:    INTAKE / OUTPUT: I/O last 3 completed shifts: In: 5101.4 [I.V.:4581.4; IV Piggyback:520] Out: 3010 [Urine:2910; Blood:100]  PHYSICAL EXAMINATION: General:  Adult male, in NAD. Neuro:  Grossly normal  HEENT:  EOMI, Head dressings with staples C/D/I. Cardiovascular:  RRR, no MRG. Lungs:  Resps unlabored.  CTAB. Abdomen:  Active bowel sounds, non-distended. Musculoskeletal:  No deformities, no edema. Skin:  Warm, dry.   LABS:  BMET Recent Labs   Lab 03/26/18 0332 03/28/18 1412  03/29/18 0349  03/30/18 0005 03/30/18 0600 03/30/18 1149  NA 142 139   < > 147*   < > 146* 146* 145  K 3.4* 3.6  --  3.5  --   --   --   --   CL 110 105  --  118*  --   --   --   --   CO2 25 26  --  26  --   --   --   --   BUN 6 7  --  11  --   --   --   --   CREATININE 0.68 0.61  --  0.58*  --   --   --   --   GLUCOSE 125* 136*  --  114*  --   --   --   --    < > = values in this interval not displayed.    Electrolytes Recent Labs  Lab 03/25/18 0348 03/26/18 0332 03/28/18 1412 03/29/18 0349  CALCIUM 7.6* 8.3* 8.8* 7.9*  MG 1.9 2.4  --  2.3  PHOS 3.2 1.9*  --  2.4*    CBC Recent Labs  Lab 03/26/18 0332 03/28/18 1412 03/29/18 0349  WBC 9.8 13.2* 9.8  HGB 11.3* 11.6* 9.7*  HCT 33.0* 33.9* 28.6*  PLT 183 281 226    Coag's Recent Labs  Lab 03/24/18 0345  APTT 33  INR 0.86    Sepsis Markers  No results for input(s): LATICACIDVEN, PROCALCITON, O2SATVEN in the last 168 hours.  ABG Recent Labs  Lab 03/24/18 0638 03/25/18 0155 03/28/18 0035  PHART 7.347* 7.370 7.419  PCO2ART 38.4 41.4 38.7  PO2ART 470.0* 218* 170*    Liver Enzymes Recent Labs  Lab 03/24/18 0345  AST 23  ALT 27  ALKPHOS 59  BILITOT 0.5  ALBUMIN 5.0    Cardiac Enzymes No results for input(s): TROPONINI, PROBNP in the last 168 hours.  Glucose No results for input(s): GLUCAP in the last 168 hours.  Imaging No results found.   STUDIES:  CT Head 6/16 > 1. Acute 1 cm RIGHT holo hemispheric subdural hematoma resulting 1 cm RIGHT-to-LEFT subfalcine herniation with LEFT lateral ventricle entrapment. 2. Trace acute falcotentorial subdural hematoma. CT Head 6/16 > 1. Interval right lateral craniotomy, placement of extra-axial drain, partial evacuation of subdural hematoma. 2. Acute extra-axial hemorrhage over the right convexity is now concentrated subjacent to the craniotomy and increased in thickness when compared with the preoperative CT  which may represent postoperative change and/or interval hemorrhage. 3. New acute 15 mm intra-axial hemorrhage in right posterior temporal lobe. 4. Mass effect with stable 10 mm right-to-left midline shift and mild left ventricular entrapment. CT head 6/17 > decreased right SDH with similar small falcotentorial SDH.  48mm residual R to L MLS which is decreased from 29mm, resolving left ventricular entrapment, evolving 22mm right temporal lobe hematoma. CT head 6/18 > mildly increased mass effect, otherwise stable. CT head 6/20 > no significant change. EEG 6/20 > generalized cerebral dysfunction.  CULTURES: MRSA 6/16 > Negative  STaph 6/16 > Negative   ANTIBIOTICS: None.   SIGNIFICANT EVENTS: 6/16 > Presents to ED  6/17 > extubated. 6/20 > back to OR for right decompressive craniectomy.  LINES/TUBES: ETT 6/16 > 6/17.  6/20 >   DISCUSSION: 57 year old male presents to ED on 6/16 with Right SDH. Underwent evacuation. On 6/16 2030 patient with lethargy. CT Head with large acute epidural hematoma. Taken to OR for Craniotomy with evacuation.  Extubated 6/17 and tolerated well. Had change in mental status 6/18 and worsened 6/20 so taken back to OR for right decompressive craniectomy.  PCCM called back for vent management.  ASSESSMENT / PLAN:  Respiratory Insufficieny in post-operative setting, extubated 6/21 Plan  Supplemental O2 for respiratory support to keep saturations >92% Incentive spirometer at bedside   HTN H/O HLD  Plan  Cardiac Monitoring BP goals per neurosurgery Hydralazine and Labetalol PRN Continue Zocor   SUP Nutrition Plan Famotidine Advance diet as tolerated   Right SDH s/p Evacuation. Large Acute Epidural Hematoma s/p Evacuation and Craniotomy  S/p right decompressive craniectomy 6/20 after worsening mental status Plan  Neurosurgery managing / following Continue keppra, 3% saline per neurosurgery Hold all anticoagulation    FAMILY  - Updates:  Wife updated at bedside.  - Inter-disciplinary family meet or Palliative Care meeting due by:  04/01/2018    CC time: 30 min.   Caffie Damme, MD  Pulmonary and [place] Pager: [phone]

## 2018-03-30 NOTE — Progress Notes (Signed)
Neurology Progress Note: Reason for Consult: Possible Seizures Referring Physician: Dr Kathyrn Sheriff CC: headache  HPI: Barry Miranda is a 57 y.o. male who presented to the emergency department on 6/16 headache and vomiting and noted to have a large spontaneous subdural hemorrhage in the right hemisphere.  Patient taken to the OR for craniotomy and evacuation, later in the night found to have a large epidural hematoma on CT.  Taken back to the OR for evacuation.  He was extubated on 6/17 noted to have deterioration in mental status.  CT was unchanged at this time. Patient was started on Keppra as well as hypertonic saline and taken back to the OR for decompressive craniectomy. No seizures witnessed, EEG showed no non-convusive or subclinical seizures.  Interval Hx: Pt continues to have improved mentation. Only c/o some h/a today.    ROS:  ROS was performed and is negative except as noted in the HPI   Exam: Current vital signs: BP (!) 141/49   Pulse 91   Temp 98.2 F (36.8 C) (Oral) Comment: Simultaneous filing. User may not have seen previous data. Comment (Src): Simultaneous filing. User may not have seen previous data.  Resp 18   Ht 5\' 4"  (1.626 m)   Wt 153 lb 3.5 oz (69.5 kg)   SpO2 99%   BMI 26.30 kg/m  Vital signs in last 24 hours: Temp:  [98 F (36.7 C)-100 F (37.8 C)] 98.2 F (36.8 C) (06/22 0800) Pulse Rate:  [68-95] 91 (06/22 1000) Resp:  [10-18] 18 (06/22 1000) BP: (131-184)/(49-98) 141/49 (06/22 1000) SpO2:  [96 %-100 %] 99 % (06/22 1000) Arterial Line BP: (208-244)/(74-94) 210/76 (06/21 1600)   Physical Exam  Constitutional: Appears well-developed and well-nourished.  Psych: Affect appropriate to situation Eyes: No scleral injection HENT: R craniectomy scar and some swelling at sight Head: swelling at crani site, but well appr and not red/tender Cardiovascular: Normal rate and regular rhythm.  Respiratory: Effort normal, non-labored breathing GI: Soft.  No  distension. There is no tenderness.  Skin: WDI  Neuro: Mental Status: Awakens easily to voice, attentive and orients to person, place, month, year, and situation. No signs of aphasia or neglect. Follows commands slowly Cranial Nerves: II: Visual Fields are full. Pupils are equal, round, and reactive to light.   III,IV, VI: EOMI without ptosis or diploplia.  V: Facial sensation is symmetric to temperature VII: Facial movement is symmetric.  VIII: hearing is intact to voice X: Uvula elevates symmetrically XI: Shoulder shrug is symmetric. XII: tongue is midline without atrophy or fasciculations.  Motor: Tone is normal. Bulk is normal. 5/5 strength was present in all four extremities. Sensory: Sensation is symmetric to light touch and temperature in the arms and legs. Deep Tendon Reflexes: 2+ and symmetric in the biceps and patellae.  Plantars: Toes are downgoing bilaterally.  Cerebellar: FNF and HKS are intact bilaterally  I have reviewed labs and imaging in epic and the results pertinent to this consultation, including multiple CT has from 6/16 to last one performed on 6/20  EEG impression:  The EEG is abnormal and findings are consistent with generalized and focal left cerebral dysfunction suggesting underlying left hemispheric structural defect. Epileptiform features were not seen during this recording.   ASSESSMENT AND PLAN 57 y.o. Male with spontanous R SDH and epidural hematoma.Had decrease in mental status prompting R hemicraniectomy. My suspicion is likely due to cerebral edema despite no significant worsening of midline shift, now has improved following hemicraniectomy. Seizures a possibility, agree  with Keppra. Since no recent fluctationin exam and low suspicion for NCSE, will not need a cEEG at this time.  # R hemisphere SDH and Epidural hematoma s/p R Hemicraniectomy  # Clinically significant Cerebral edema with midline shift and brain compression- this is most likely  cause of his acute AMS change. This has improved post op second trip to OR. EEG showed no subclinical seizures. He remains at risk for this and should cont Keppra at this time.   # Encephalopathy- d/t above and is improving  Recommendations No need for continuous EEG at this time as patient no longer having fluctuating mental status Continue Keppra 1g BID empirically for at least 1 week, possibly longer if indication becomes more evident Post op care per NSX Close mgt of cerebral edema Close monitoring We will sign off at this time. If patient has another episode of alteration in mental status then please call us back, will get cEEG at that time

## 2018-03-30 NOTE — Progress Notes (Signed)
Subjective: Patient reports no headache, wife states he had a good night  Objective: Vital signs in last 24 hours: Temp:  [98 F (36.7 C)-100 F (37.8 C)] 99.6 F (37.6 C) (06/22 0321) Pulse Rate:  [68-95] 86 (06/22 0700) Resp:  [10-18] 16 (06/22 0700) BP: (135-184)/(63-98) 158/76 (06/22 0700) SpO2:  [96 %-100 %] 97 % (06/22 0700) Arterial Line BP: (177-246)/(70-94) 210/76 (06/21 1600) FiO2 (%):  [30 %] 30 % (06/21 0907)  Intake/Output from previous day: 06/21 0701 - 06/22 0700 In: 3009.7 [I.V.:2709.7; IV Piggyback:300] Out: 1200 [Urine:1200] Intake/Output this shift: No intake/output data recorded.  Eyes open spontaneously, all his commands all 4 extremities, no facial droop, dressing dry  Lab Results: Lab Results  Component Value Date   WBC 9.8 03/29/2018   HGB 9.7 (L) 03/29/2018   HCT 28.6 (L) 03/29/2018   MCV 90.8 03/29/2018   PLT 226 03/29/2018   Lab Results  Component Value Date   INR 0.86 03/24/2018   BMET Lab Results  Component Value Date   NA 146 (H) 03/30/2018   K 3.5 03/29/2018   CL 118 (H) 03/29/2018   CO2 26 03/29/2018   GLUCOSE 114 (H) 03/29/2018   BUN 11 03/29/2018   CREATININE 0.58 (L) 03/29/2018   CALCIUM 7.9 (L) 03/29/2018    Studies/Results: Ct Head Wo Contrast  Result Date: 03/28/2018 CLINICAL DATA:  Altered level of consciousness, lethargy EXAM: CT HEAD WITHOUT CONTRAST TECHNIQUE: Contiguous axial images were obtained from the base of the skull through the vertex without intravenous contrast. COMPARISON:  CT brain scan 03/28/2017 and 03/26/2017 FINDINGS: Brain: There is little change in the mixed density right subdural hematoma in the frontotemporal region with some acute component which is stable. Hemorrhagic contusion in the right temporal region is stable as well. No new area of hemorrhage is seen. Midline shift now measures 10 mm compared to 11.5 mm previously. Effacement of right cortical sulci and some narrowing of the ventricular  system remains unchanged. Old left basal ganglial lacunar infarct is stable. The fourth ventricle and basilar cisterns are stable. A small amount of pneumocephalus noted after craniotomy for subdural hematoma evacuation. Vascular: No vascular abnormality is noted on this unenhanced study. Skull: On bone window images, changes of right craniotomy are noted. There is soft tissue swelling over the craniotomy site. Small amount of pneumocephaly is present. Sinuses/Orbits: The paranasal sinuses appear well pneumatized air Other: None. IMPRESSION: 1. Stable mixed density right subdural hematoma with some decreased shift of mediastinum to the left from 11.5 mm to 10 mm currently. 2. No change in hemorrhagic in the right temporal region. Electronically Signed   By: Ivar Drape M.D.   On: 03/28/2018 15:40   Ct Head Wo Contrast  Result Date: 03/28/2018 CLINICAL DATA:  Subdural hematoma. Increased lethargy and slurred speech today. EXAM: CT HEAD WITHOUT CONTRAST TECHNIQUE: Contiguous axial images were obtained from the base of the skull through the vertex without intravenous contrast. COMPARISON:  CT head without contrast 03/26/2018 and 03/24/2018. FINDINGS: Brain: A right extensive E subdural hematoma is again seen without significant interval change. Hemorrhagic contusion in the right temporal lobe is again seen. No new areas of hemorrhage are present. Midline shift has increased since the prior exam, now measuring 11.5 mm. There is mass effect with effacement of the sulci bilaterally. Mild prominence of the left temporal tip is again noted. The right lateral ventricle remains effaced. Stable blood products are noted over the tentorium. Lacunar infarct is again noted within the  left basal ganglia. No acute cortical infarct is present. Vascular: Atherosclerotic calcifications are present along the cavernous right internal carotid artery. No hyperdense vessel is present. Skull: Right craniotomy is again noted. Extracranial  hematoma is similar to the prior exam. Some gas remains external to the craniotomy following removal of the surgical drain. Sinuses/Orbits: The globes and orbits are within normal limits. The paranasal sinuses and mastoid air cells are clear. IMPRESSION: 1. Mixed density right subdural hematoma is not significantly changed in size. It measures up to 12 mm maximally on the coronal images. 2. No new hemorrhage. Midline shift has increased from 10 mm to 11.5 mm. 3. Mass effect with effacement of the sulci and right lateral ventricle. There is slight prominence of the left temporal tip without significant interval change. 4. Remote lacunar infarct of the left basal ganglia versus choroidal fissure cyst. Electronically Signed   By: San Morelle M.D.   On: 03/28/2018 08:53   Dg Chest Port 1 View  Result Date: 03/29/2018 CLINICAL DATA:  Line placement EXAM: PORTABLE CHEST 1 VIEW COMPARISON:  03/25/2018, 11/29/2011 FINDINGS: Endotracheal tube tip is about 2 cm superior to the carina. Esophageal tube has been removed. New right IJ central venous catheter with tip projecting over the SVC. No pneumothorax. Partial consolidation at the medial left lung base. No pleural effusion. Normal heart size. IMPRESSION: 1. Removal of esophageal tube 2. Endotracheal tube tip about 2 cm superior to the carina 3. New right IJ central venous catheter with tip overlying SVC, no pneumothorax 4. Partial atelectasis or infiltrate at the left base. Electronically Signed   By: Donavan Foil M.D.   On: 03/29/2018 00:24    Assessment/Plan: Seems to be stable. Awake today and following commands. Continue current management  Estimated body mass index is 26.3 kg/m as calculated from the following:   Height as of this encounter: 5\' 4"  (1.626 m).   Weight as of this encounter: 69.5 kg (153 lb 3.5 oz).    LOS: 6 days    Barry Miranda S 03/30/2018, 7:48 AM

## 2018-03-31 LAB — SODIUM
SODIUM: 149 mmol/L — AB (ref 135–145)
SODIUM: 144 mmol/L (ref 135–145)
Sodium: 147 mmol/L — ABNORMAL HIGH (ref 135–145)
Sodium: 148 mmol/L — ABNORMAL HIGH (ref 135–145)

## 2018-03-31 MED ORDER — HYDROMORPHONE HCL 1 MG/ML IJ SOLN
1.0000 mg | INTRAMUSCULAR | Status: DC | PRN
  Administered 2018-03-31: 1 mg via INTRAVENOUS
  Filled 2018-03-31: qty 1

## 2018-03-31 MED ORDER — NICARDIPINE HCL IN NACL 20-0.86 MG/200ML-% IV SOLN
3.0000 mg/h | INTRAVENOUS | Status: DC
  Administered 2018-03-31: 5 mg/h via INTRAVENOUS
  Administered 2018-03-31 – 2018-04-01 (×2): 3 mg/h via INTRAVENOUS
  Administered 2018-04-01: 10 mg/h via INTRAVENOUS
  Filled 2018-03-31 (×4): qty 200

## 2018-03-31 NOTE — Evaluation (Signed)
Clinical/Bedside Swallow Evaluation Patient Details  Name: Barry Miranda MRN: [id number] Date of Birth: 1961-05-21  Today's Date: 03/31/2018 Time: SLP Start Time (ACUTE ONLY): 0940 SLP Stop Time (ACUTE ONLY): 0955 SLP Time Calculation (min) (ACUTE ONLY): 15 min  Past Medical History:  Past Medical History:  Diagnosis Date  . Hyperlipidemia    Past Surgical History:  Past Surgical History:  Procedure Laterality Date  . CHOLECYSTECTOMY  2012  . CRANIECTOMY FOR DEPRESSED SKULL FRACTURE Right 03/28/2018   Procedure: CRANIECTOMY for cerebral edema with Placement of Skull Flap in Abdomen;  Surgeon: Consuella Lose, MD;  Location: [place] [facility];  Service: Neurosurgery;  Laterality: Right;  . CRANIOTOMY Right 03/24/2018   Procedure: CRANIOTOMY, HEMATOMA EVACUATION SUBDURAL, RIGHT SIDE;  Surgeon: Erline Levine, MD;  Location: [place];  Service: Neurosurgery;  Laterality: Right;  . CRANIOTOMY Right 03/24/2018   Procedure: CRANIOTOMY HEMATOMA EVACUATION EPIDURAL;  Surgeon: Kristeen Miss, MD;  Location: [place];  Service: Neurosurgery;  Laterality: Right;  . LIPOMA EXCISION     lipoma on forehead   . WISDOM TOOTH EXTRACTION     HPI:  57 year old male with history of hyperlipidemia, presenting to the ED with headache.  CT showing large R SDH in much of the frontotemporoparietal regions.  Pt s/p crani and drain placement. Pt with increased lethargy on 6/19; repeat CT on 6/20 with 1.79mm midline shift increase. He additionally underwent decompressive right frontoparietal craniectomy and implantation of bone flap 03/28/18. Intubated 6/16-6/17 and 6/20-6/21.   Assessment / Plan / Recommendation Clinical Impression   Pt lethargic but alert with consistent cues. Voice low intensity. Has been tolerating sips of clear liquids. After oral care, pt took sips of water, holding and swishing orally, gesturing for suction to expectorate. Oral containment, control appear adequate. Airway protection appears adequate for thin  liquids via cup and straw, consecutive sips of thin liquids in excess of 3 oz, and teaspoons puree. Pt does orally hold boluses intermittently, but swallows with min cue. Vital signs stable and voice clear. Recommend full liquids with supervision for now due to decreased arousal, meds crushed in puree. Would allow bites of puree from floor stock with RN if pt able to sustain alertness. Prognosis for advancement good with continued improvements in mentation. Will follow up for tolerance, advancement.    SLP Visit Diagnosis: Dysphagia, unspecified (R13.10)    Aspiration Risk  Moderate aspiration risk    Diet Recommendation Thin liquid(bites of puree from floor stock with RN if alert)   Liquid Administration via: Straw Medication Administration: Crushed with puree Supervision: Full supervision/cueing for compensatory strategies Compensations: Slow rate;Small sips/bites;Minimize environmental distractions Postural Changes: Seated upright at 90 degrees    Other  Recommendations Oral Care Recommendations: Oral care QID   Follow up Recommendations Inpatient Rehab      Frequency and Duration min 2x/week  2 weeks       Prognosis Prognosis for Safe Diet Advancement: Good Barriers to Reach Goals: Cognitive deficits      Swallow Study   General Date of Onset: 03/23/18 HPI: 57 year old male with history of hyperlipidemia, presenting to the ED with headache.  CT showing large R SDH in much of the frontotemporoparietal regions.  Pt s/p crani and drain placement. Pt with increased lethargy on 6/19; repeat CT on 6/20 with 1.36mm midline shift increase. He additionally underwent decompressive right frontoparietal craniectomy and implantation of bone flap 03/28/18. Intubated 6/16-6/17 and 6/20-6/21. Type of Study: Bedside Swallow Evaluation Previous Swallow Assessment: none in chart Diet Prior to  this Study: NPO(sips of clears) Temperature Spikes Noted: Yes(99.6) Respiratory Status: Room  air History of Recent Intubation: Yes Length of Intubations (days): (twice, for total of 4 days) Date extubated: 03/29/18 Behavior/Cognition: Lethargic/Drowsy;Requires cueing Oral Cavity Assessment: Within Functional Limits Oral Care Completed by SLP: Yes Oral Cavity - Dentition: Adequate natural dentition Vision: Functional for self-feeding Self-Feeding Abilities: Needs assist Patient Positioning: Upright in bed Baseline Vocal Quality: Low vocal intensity Volitional Cough: Cognitively unable to elicit Volitional Swallow: Able to elicit    Oral/Motor/Sensory Function Overall Oral Motor/Sensory Function: Generalized oral weakness(pt with slow, slight movements during assessment) Lingual Symmetry: Within Functional Limits   Ice Chips Ice chips: Impaired Presentation: Spoon Oral Phase Functional Implications: Oral holding;Prolonged oral transit   Thin Liquid Thin Liquid: Impaired Presentation: Cup;Straw Oral Phase Impairments: Poor awareness of bolus Oral Phase Functional Implications: Oral holding;Prolonged oral transit    Nectar Thick Nectar Thick Liquid: Not tested   Honey Thick Honey Thick Liquid: Not tested   Puree Puree: Impaired Presentation: Spoon Oral Phase Impairments: Poor awareness of bolus Oral Phase Functional Implications: Oral holding;Prolonged oral transit   Solid   GO   Solid: Not tested       Barry Lever, MS, CCC-SLP Speech-Language Pathologist [phone]  Barry Miranda 03/31/2018,10:14 AM

## 2018-03-31 NOTE — Progress Notes (Signed)
Maybe a little more alert today, [id number], incision ok, MAEx4, CCM

## 2018-03-31 NOTE — Progress Notes (Signed)
PULMONARY / CRITICAL CARE MEDICINE   Name: Barry Miranda MRN: [id number] DOB: 30-Oct-1960    ADMISSION DATE:  03/23/2018 CONSULTATION DATE:  03/25/2018  REFERRING MD:  Dr. Vertell Limber   CHIEF COMPLAINT:  Intubated Post-Op  HISTORY OF PRESENT ILLNESS:   57 year old male with PMH of HLD  Presents to ED on 6/16 with headache with nausea/vomiting. Patient with left hemiparesis. CT head with large hemispheric SDH on the right. Taken to OR for craniotomy with evacuation. On 6/16 at 2030 patient with lethargy. Taken to CT which revealed large acute epidural hematoma under the bone flap with significant mass-effect . Taken back to OR for evacuation. Remained intubated in post-operative period. PCCM consulted.    SUBJECTIVE:  Extubated 6/17 and tolerated well. Head CT 6/18 with slightly more edema.  More lethargic with slurring of speech 6/20.  Had further deterioration that day with change in mental status.  CT head obtained but essentially unchanged.  Keppra increased and hypertonic saline started.  Given continued decline, decision made to proceed with right decompressive craniectomy.  Evening 6/20, taken to OR then returned to ICU on vent and PCCM called back to assist with vent management. Re-extubated 6/21 and has been stable since.   VITAL SIGNS: BP (!) 158/81 (BP Location: Left Arm)   Pulse 67   Temp 97.6 F (36.4 C) (Axillary)   Resp 12   Ht 5\' 4"  (1.626 m)   Wt 69.5 kg (153 lb 3.5 oz)   SpO2 100%   BMI 26.30 kg/m   HEMODYNAMICS:    VENTILATOR SETTINGS: FiO2 (%):  [30 %] 30 %  INTAKE / OUTPUT: I/O last 3 completed shifts: In: 3872.7 [I.V.:3421.9; IV Piggyback:450.9] Out: 1425 [OJJKK:[id number]]  PHYSICAL EXAMINATION: General:  Adult male, in NAD. Neuro:  Grossly normal, improving in mentation from yesterday  HEENT:  EOMI, Head dressings with staples C/D/I. Cardiovascular:  RRR, no MRG. Lungs:  Resps unlabored.  CTAB. Abdomen:  Active bowel sounds, non-distended. Musculoskeletal:   No deformities, no edema. Skin:  Warm, dry.   LABS:  BMET Recent Labs  Lab 03/26/18 0332 03/28/18 1412  03/29/18 0349  03/30/18 2357 03/31/18 0542 03/31/18 1232  NA 142 139   < > 147*   < > 147* 148* 149*  K 3.4* 3.6  --  3.5  --   --   --   --   CL 110 105  --  118*  --   --   --   --   CO2 25 26  --  26  --   --   --   --   BUN 6 7  --  11  --   --   --   --   CREATININE 0.68 0.61  --  0.58*  --   --   --   --   GLUCOSE 125* 136*  --  114*  --   --   --   --    < > = values in this interval not displayed.    Electrolytes Recent Labs  Lab 03/25/18 0348 03/26/18 0332 03/28/18 1412 03/29/18 0349  CALCIUM 7.6* 8.3* 8.8* 7.9*  MG 1.9 2.4  --  2.3  PHOS 3.2 1.9*  --  2.4*    CBC Recent Labs  Lab 03/26/18 0332 03/28/18 1412 03/29/18 0349  WBC 9.8 13.2* 9.8  HGB 11.3* 11.6* 9.7*  HCT 33.0* 33.9* 28.6*  PLT 183 281 226    Coag's No results for input(s):  APTT, INR in the last 168 hours.  Sepsis Markers No results for input(s): LATICACIDVEN, PROCALCITON, O2SATVEN in the last 168 hours.  ABG Recent Labs  Lab 03/25/18 0155 03/28/18 0035  PHART 7.370 7.419  PCO2ART 41.4 38.7  PO2ART 218* 170*    Liver Enzymes No results for input(s): AST, ALT, ALKPHOS, BILITOT, ALBUMIN in the last 168 hours.  Cardiac Enzymes No results for input(s): TROPONINI, PROBNP in the last 168 hours.  Glucose No results for input(s): GLUCAP in the last 168 hours.  Imaging No results found.   STUDIES:  CT Head 6/16 > 1. Acute 1 cm RIGHT holo hemispheric subdural hematoma resulting 1 cm RIGHT-to-LEFT subfalcine herniation with LEFT lateral ventricle entrapment. 2. Trace acute falcotentorial subdural hematoma. CT Head 6/16 > 1. Interval right lateral craniotomy, placement of extra-axial drain, partial evacuation of subdural hematoma. 2. Acute extra-axial hemorrhage over the right convexity is now concentrated subjacent to the craniotomy and increased in thickness when  compared with the preoperative CT which may represent postoperative change and/or interval hemorrhage. 3. New acute 15 mm intra-axial hemorrhage in right posterior temporal lobe. 4. Mass effect with stable 10 mm right-to-left midline shift and mild left ventricular entrapment. CT head 6/17 > decreased right SDH with similar small falcotentorial SDH.  79mm residual R to L MLS which is decreased from 99mm, resolving left ventricular entrapment, evolving 74mm right temporal lobe hematoma. CT head 6/18 > mildly increased mass effect, otherwise stable. CT head 6/20 > no significant change. EEG 6/20 > generalized cerebral dysfunction.  CULTURES: MRSA 6/16 > Negative  STaph 6/16 > Negative   ANTIBIOTICS: None.   SIGNIFICANT EVENTS: 6/16 > Presents to ED  6/17 > extubated. 6/20 > back to OR for right decompressive craniectomy.  LINES/TUBES: ETT 6/16 > 6/17.  6/20 >   DISCUSSION: 57 year old male presents to ED on 6/16 with Right SDH. Underwent evacuation. On 6/16 2030 patient with lethargy. CT Head with large acute epidural hematoma. Taken to OR for Craniotomy with evacuation.  Extubated 6/17 and tolerated well. Had change in mental status 6/18 and worsened 6/20 so taken back to OR for right decompressive craniectomy.  PCCM called back for vent management.  ASSESSMENT / PLAN:  Respiratory Insufficieny in post-operative setting, extubated 6/21 Plan  Supplemental O2 for respiratory support to keep saturations >92% Incentive spirometer at bedside   HTN H/O HLD  Plan  Cardiac Monitoring BP goals per neurosurgery Hydralazine and Labetalol PRN Continue Zocor   SUP Nutrition Plan Famotidine Advance diet as tolerated   Right SDH s/p Evacuation. Large Acute Epidural Hematoma s/p Evacuation and Craniotomy  S/p right decompressive craniectomy 6/20 after worsening mental status Plan  Neurosurgery managing / following Continue keppra, 3% saline per neurosurgery Hold all  anticoagulation    FAMILY  - Updates: Wife updated at bedside.  - Inter-disciplinary family meet or Palliative Care meeting due by:  04/01/2018   Thank you for the interesting consult and please don't hesitate to contact the Pulmonary service if any additional questions or concerns arise.    CC time: 30 min.   Caffie Damme, MD  Pulmonary and [place] Pager: [phone]

## 2018-04-01 DIAGNOSIS — D62 Acute posthemorrhagic anemia: Secondary | ICD-10-CM

## 2018-04-01 DIAGNOSIS — I10 Essential (primary) hypertension: Secondary | ICD-10-CM

## 2018-04-01 DIAGNOSIS — E785 Hyperlipidemia, unspecified: Secondary | ICD-10-CM

## 2018-04-01 DIAGNOSIS — G939 Disorder of brain, unspecified: Secondary | ICD-10-CM

## 2018-04-01 DIAGNOSIS — R9089 Other abnormal findings on diagnostic imaging of central nervous system: Secondary | ICD-10-CM

## 2018-04-01 DIAGNOSIS — R000 Tachycardia, unspecified: Secondary | ICD-10-CM

## 2018-04-01 DIAGNOSIS — Z720 Tobacco use: Secondary | ICD-10-CM

## 2018-04-01 DIAGNOSIS — E870 Hyperosmolality and hypernatremia: Secondary | ICD-10-CM

## 2018-04-01 DIAGNOSIS — I6200 Nontraumatic subdural hemorrhage, unspecified: Secondary | ICD-10-CM

## 2018-04-01 DIAGNOSIS — I6201 Nontraumatic acute subdural hemorrhage: Secondary | ICD-10-CM

## 2018-04-01 LAB — SODIUM
SODIUM: 147 mmol/L — AB (ref 135–145)
Sodium: 145 mmol/L (ref 135–145)
Sodium: 147 mmol/L — ABNORMAL HIGH (ref 135–145)
Sodium: 149 mmol/L — ABNORMAL HIGH (ref 135–145)

## 2018-04-01 MED ORDER — BUTALBITAL-APAP-CAFFEINE 50-325-40 MG PO TABS
1.0000 | ORAL_TABLET | ORAL | Status: DC | PRN
  Administered 2018-04-01 – 2018-04-04 (×8): 1 via ORAL
  Filled 2018-04-01 (×8): qty 1

## 2018-04-01 MED ORDER — HYDROCODONE-ACETAMINOPHEN 5-325 MG PO TABS: 1.0000 | ORAL_TABLET | ORAL | Status: DC | PRN

## 2018-04-01 MED ORDER — DOCUSATE SODIUM 50 MG/5ML PO LIQD
100.0000 mg | Freq: Two times a day (BID) | ORAL | Status: DC
  Administered 2018-04-01 – 2018-04-04 (×6): 100 mg via ORAL
  Filled 2018-04-01 (×6): qty 10

## 2018-04-01 MED ORDER — NICARDIPINE HCL IN NACL 40-0.83 MG/200ML-% IV SOLN
3.0000 mg/h | INTRAVENOUS | Status: DC
  Administered 2018-04-01: 5 mg/h via INTRAVENOUS
  Administered 2018-04-01: 10 mg/h via INTRAVENOUS
  Administered 2018-04-02: 5 mg/h via INTRAVENOUS
  Filled 2018-04-01 (×3): qty 200

## 2018-04-01 NOTE — PMR Pre-admission (Signed)
PMR Admission Coordinator Pre-Admission Assessment   Patient: Barry Miranda is an 57 y.o., male MRN: [id number] DOB: 12/28/1960 Height: 5\' 4"  (162.6 cm) Weight: 69.5 kg (153 lb 3.5 oz)               Insurance Information HMO:     PPO: Yes     PCP:      IPA:      80/20:      OTHER:  PRIMARY: [employer] ([employer])      Policy#: [id number]      Subscriber: Meridee Score CM Name: Vevelyn Royals       Phone#: [phone]     Fax#: [phone]  Updates due to Vevelyn Royals on day 7 of inpatient rehab (Pt admitted 04/04/18 with update due 04/11/18).  Pre-Cert#: [id number]      Employer:  Benefits:  Phone #: [phone]     Name: [email or web address] Eff. Date: 10/09/17     Deduct: $1000  Out of Pocket Max: $5,500      Life Max: NA CIR: $300/admisssion then 70% after deductible      SNF: 70%/30% after deductible with 90 day limit Outpatient: (60 combined PT/OT/SLP visit limit) 70% after deductible   Co-Pay: 30% after deductible   Home Health: (60 visit limit with 1 visit=4 hours)70% after deductible      Co-Pay: 30% after deductible  DME: 70% after deductible     Co-Pay: 30% after deductible Providers:   Medicaid Application Date:       Case Manager:  Disability Application Date:       Case Worker:   Emergency Contact Information Contact Information    Name Relation Home Work Armona Spouse [phone]  [phone]     Current Medical History  Patient Admitting Diagnosis: Right SDH/EDH History of Present Illness: Barry Miranda is a 57 year old right-handed male with history of hyperlipidemia and tobacco abuse.  Per chart review and wife patient independent prior to admission living with spouse.  He is an Conservation officer, nature.  Two-level home with bed and bath upstairs.  Presented 03/24/2018 with headache times several days with associated nausea and vomiting as well as photophobia.  Denied any trauma or falls.  Cranial CT scan reviewed, showing right SDH.  Per report, acute 1 cm right  holohemispheric subdural hematoma resulting in a 1 cm right to left subfalcine herniation with left lateral ventricle entrapment.  Underwent craniotomy evacuation of hematoma 03/24/2018 per Dr. Vertell Limber.  Postoperative lethargy with follow-up CT demonstrating presence of acute epidural hematoma in the region of previous subdural hematoma and underwent right frontal temporal craniotomy evacuation 03/25/2018 per Dr. Ellene Route.  Patient remained intubated for a short time.  Patient did well until approximately 36 hours after latest surgery with progressive decline somewhat waxing and waning course.  Repeat scan demonstrated continued approximately 12 mm right subdural hematoma and 11 mm midline shift.  There was also effacement of the basal cisterns.  Patient again returned to the OR underwent decompressive right frontal parietal craniectomy implantation of bone flap and subcutaneous pocket 03/28/2018 per Dr. Kathyrn Sheriff.  Maintained on Keppra for seizure prophylaxis with EEG completed 03/28/2018 consistent with generalized and focal left cerebral dysfunction no seizure activity .   He was ultimately extubated 03/29/2018.  Patient had remained on 3% normal saline through 04/03/2018 and stopped.  Physical and occupational therapy evaluations completed and ongoing with recommendations of physical medicine rehab consult.  Patient is to be admitted for a comprehensive rehab program on  04/04/18.      Past Medical History  Past Medical History:  Diagnosis Date  . Hyperlipidemia     Family History  family history includes Diabetes in his mother; Hypertension in his mother.  Prior Rehab/Hospitalizations:  Has the patient had major surgery during 100 days prior to admission? No  Current Medications   Current Facility-Administered Medications:  .  0.9 %  sodium chloride infusion, , Intravenous, Q2H PRN, Erline Levine, MD, Stopped at 03/31/18 0004 .  0.9 %  sodium chloride infusion, , Intravenous, Continuous, Costella,  Vista Mink, PA-C, Stopped at 04/02/18 2313 .  acetaminophen (TYLENOL) tablet 650 mg, 650 mg, Oral, Q4H PRN, 650 mg at 04/04/18 0901 **OR** acetaminophen (TYLENOL) suppository 650 mg, 650 mg, Rectal, Q4H PRN, Costella, Vincent J, PA-C .  acetaminophen (TYLENOL) tablet 650 mg, 650 mg, Oral, Q4H PRN, Erline Levine, MD, 650 mg at 04/03/18 1230 .  bisacodyl (DULCOLAX) suppository 10 mg, 10 mg, Rectal, Daily PRN, Kristeen Miss, MD .  butalbital-acetaminophen-caffeine (FIORICET, ESGIC) 50-325-40 MG per tablet 1 tablet, 1 tablet, Oral, Q4H PRN, Costella, Vista Mink, PA-C, 1 tablet at 04/04/18 1222 .  cholecalciferol (VITAMIN D) tablet 2,000 Units, 2,000 Units, Oral, Daily, Kristeen Miss, MD, 2,000 Units at 04/04/18 0931 .  docusate (COLACE) 50 MG/5ML liquid 100 mg, 100 mg, Oral, BID, Erline Levine, MD, 100 mg at 04/04/18 0934 .  famotidine (PEPCID) tablet 20 mg, 20 mg, Oral, BID, Erline Levine, MD, 20 mg at 04/04/18 0933 .  fentaNYL (SUBLIMAZE) injection 25-100 mcg, 25-100 mcg, Intravenous, Q2H PRN, Costella, Vincent J, PA-C, 50 mcg at 04/01/18 0411 .  hydrALAZINE (APRESOLINE) injection 10-20 mg, 10-20 mg, Intravenous, Q6H PRN, Meyran, Ocie Cornfield, NP, 20 mg at 04/03/18 1946 .  HYDROcodone-acetaminophen (NORCO/VICODIN) 5-325 MG per tablet 1 tablet, 1 tablet, Oral, Q4H PRN, Costella, Vista Mink, PA-C .  HYDROmorphone (DILAUDID) injection 1 mg, 1 mg, Intravenous, Q3H PRN, Meyran, Ocie Cornfield, NP, 1 mg at 03/31/18 1136 .  labetalol (NORMODYNE,TRANDATE) injection 10-40 mg, 10-40 mg, Intravenous, Q10 min PRN, Kristeen Miss, MD, 10 mg at 04/02/18 0944 .  labetalol (NORMODYNE,TRANDATE) injection 10-40 mg, 10-40 mg, Intravenous, Q10 min PRN, Costella, Vincent J, PA-C, 30 mg at 04/02/18 2355 .  levETIRAcetam (KEPPRA) tablet 1,000 mg, 1,000 mg, Oral, BID, Costella, Vincent J, PA-C, 1,000 mg at 04/04/18 0931 .  naloxone (NARCAN) injection 0.08 mg, 0.08 mg, Intravenous, PRN, Costella, Vincent J, PA-C .  nicardipine  (CARDENE) 40mg  in 0.83% saline 265ml IV DOUBLE STRENGTH infusion (0.2 mg/ml), 3-15 mg/hr, Intravenous, Continuous, Erline Levine, MD, Stopped at 04/02/18 0703 .  ondansetron (ZOFRAN) tablet 4 mg, 4 mg, Oral, Q4H PRN, 4 mg at 03/26/18 2026 **OR** ondansetron (ZOFRAN) injection 4 mg, 4 mg, Intravenous, Q4H PRN, Kristeen Miss, MD, 4 mg at 03/28/18 1645 .  ondansetron (ZOFRAN) tablet 4 mg, 4 mg, Oral, Q4H PRN **OR** ondansetron (ZOFRAN) injection 4 mg, 4 mg, Intravenous, Q4H PRN, Costella, Vincent J, PA-C .  polyethylene glycol (MIRALAX / GLYCOLAX) packet 17 g, 17 g, Oral, Daily PRN, Kristeen Miss, MD, 17 g at 04/02/18 0902 .  promethazine (PHENERGAN) tablet 12.5-25 mg, 12.5-25 mg, Oral, Q4H PRN, Kristeen Miss, MD .  promethazine (PHENERGAN) tablet 12.5-25 mg, 12.5-25 mg, Oral, Q4H PRN, Costella, Vista Mink, PA-C .  senna (SENOKOT) tablet 8.6 mg, 1 tablet, Oral, BID, Kristeen Miss, MD, 8.6 mg at 04/04/18 0932 .  simvastatin (ZOCOR) tablet 20 mg, 20 mg, Per Tube, QPM, Desai, Rahul P, PA-C, 20 mg at 04/03/18 1845 .  sodium phosphate (FLEET) 7-19 GM/118ML enema 1 enema, 1 enema, Rectal, Once PRN, Kristeen Miss, MD  Patients Current Diet:  Diet Order           Diet regular Room service appropriate? 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 None  Prior Functional Level Prior Function Level of Independence: Independent Comments:  Conservation officer, nature  Self Care: Did the patient need help bathing, dressing, using the toilet or eating?  Independent  Indoor Mobility: Did the patient need assistance with walking from room to room (with or without device)? Independent  Stairs: Did the patient need assistance with internal or external stairs (with or without device)? Independent  Functional Cognition: Did the patient need help planning regular tasks such as shopping or remembering to take medications? Independent  Current Functional Level Cognition  Overall Cognitive Status: Impaired/Different from baseline Current Attention Level: Sustained Orientation Level: Oriented X4 Following Commands: Follows one step commands with increased time General Comments: More awake than previous session; Noting some difficulty with divided attention, has to stop walking to carry on a conversation    Extremity Assessment (includes Sensation/Coordination)  Upper Extremity Assessment: Generalized weakness  Lower Extremity Assessment: Defer to PT evaluation    ADLs  Overall ADL's : Needs assistance/impaired Eating/Feeding: Supervision/ safety, Sitting Eating/Feeding Details (indicate cue type and reason): pt self-feeding with supervision of wife start and end of session Grooming: Wash/dry face, Set up, Sitting Grooming Details (indicate cue type and reason): using RUE to complete  Upper Body Bathing: Minimal assistance, Sitting Lower Body Bathing: Moderate assistance, Sit to/from stand Lower Body Bathing Details (indicate cue type and reason): limited by HA  Upper Body Dressing : Minimal assistance, Sitting Lower Body Dressing: Minimal assistance, Sit to/from stand Lower Body Dressing Details (indicate cue type and reason): pt donning socks seated EOB; intermittent minA for dynamic sitting balance; increased time and effort to don L sock, verbal cues to use both hands to assist; minA for sit<>stand  Toilet Transfer: Minimal assistance,  +2 for physical assistance, Stand-pivot Toilet Transfer Details (indicate cue type and reason): simulated by stand pivot EOB to chair. Pt able to stand with min assist to void in condom cath Toileting- Clothing Manipulation and Hygiene: Minimal assistance, Sit to/from stand Functional mobility during ADLs: Minimal assistance, +2 for safety/equipment, Rolling walker General ADL Comments: pt much more awake/alert this session; completing bed mobility and transfer to recliner to finish breakfast during session    Mobility  Overal bed mobility: Needs Assistance Bed Mobility: Supine to Sit Supine to sit: Min guard Sit to supine: Mod assist, +2 for physical assistance General bed mobility comments: minguard for safety with HOB elevated; requires additional cues for safety as pt attempting to move towards EOB prior to bedrail being lowered and lines in place    Transfers  Overall transfer level: Needs assistance Equipment used: Rolling walker (2 wheeled) Transfers: Sit to/from Stand Sit to Stand: Min assist, +2 safety/equipment Stand pivot transfers:  Min assist, +2 safety/equipment General transfer comment: verbal cues for safe hand placement; pt able to rise into standing with steadying assist; verbal cues for hand placement on RW     Ambulation / Gait / Stairs / Wheelchair Mobility  Ambulation/Gait Ambulation/Gait assistance: Herbalist (Feet): (In room and Hallway ambulation) Assistive device: Rolling walker (2 wheeled) Gait Pattern/deviations: Step-through pattern, Decreased stride length, Drifts right/left, Trunk flexed General Gait Details: Noting tendency to run into objects on the L, min assist for RW management and pathfinding; cues for upright posture; unable to walk and carry on a conversation at at the same time Gait velocity: decreased Gait velocity interpretation: <1.31 ft/sec, indicative of household ambulator Stairs: Yes Stairs assistance: Supervision Stair  Management: No rails, Alternating pattern, Backwards, Forwards Number of Stairs: 4 General stair comments: steady without rail, but guarded.    Posture / Balance Dynamic Sitting Balance Sitting balance - Comments: intermittent minA for dynamic balance; minguard for static sitting  Balance Overall balance assessment: Needs assistance Sitting-balance support: Feet supported Sitting balance-Leahy Scale: Good Sitting balance - Comments: intermittent minA for dynamic balance; minguard for static sitting  Standing balance support: Bilateral upper extremity supported, During functional activity Standing balance-Leahy Scale: Poor Standing balance comment: reliant on UE support/external support at this time     Special needs/care consideration BiPAP/CPAP: No CPM: No Continuous Drip IV: No Dialysis: No        Days: No Life Vest: No Oxygen: No Special Bed: No Trach Size: No Wound Vac (area): No      LocationNA Skin: Surgical incision to Right abdomen, surgical incision to Right head               Bowel mgmt:04/02/18 Bladder mgmt: Catheter in place Diabetic mgmt: NA     Previous Home Environment Living Arrangements: Spouse/significant other, Children Available Help at Discharge: Family, Available 24 hours/day Type of Home: House Home Layout: Two level, Bed/bath upstairs Alternate Level Stairs-Rails: Right, Left Alternate Level Stairs-Number of Steps: flight Home Access: Level entry Bathroom Shower/Tub: Multimedia programmer: Standard Home Care Services: No  Discharge Living Setting Plans for Discharge Living Setting: Patient's home, Lives with (comment)(lives with wife and two daugthers) Type of Home at Discharge: House Discharge Home Layout: Two level, 1/2 bath on main level Alternate Level Stairs-Rails: None(unknown) Alternate Level Stairs-Number of Steps: unknown  Discharge Home Access: Level entry Discharge Bathroom Shower/Tub: Tub/shower unit, Walk-in  shower Discharge Bathroom Toilet: Standard Discharge Bathroom Accessibility: Yes How Accessible: Accessible via walker Does the patient have any problems obtaining your medications?: No  Social/Family/Support Systems Patient Roles: Spouse, Parent Contact Information: wife is to be emergency contact Anticipated Caregiver: wife: [name] Anticipated Ambulance person Information: wife (home) [phone]; (cell) [phone] Ability/Limitations of Caregiver: wife and two daugthers can assist for 24/7 assist Caregiver Availability: 24/7 Discharge Plan Discussed with Primary Caregiver: Yes Is Caregiver In Agreement with Plan?: Yes Does Caregiver/Family have Issues with Lodging/Transportation while Pt is in Rehab?: No   Goals/Additional Needs Patient/Family Goal for Rehab: PT/OT/SLP: Supervision/Min A Expected length of stay: 12-16 days Cultural Considerations: Buddhist  Dietary Needs: Regular, Thin liquids Equipment Needs: TBD Special Service Needs: Pt has craniectomy flap, with the bone flap implanted in abdomen (may need helmet or protected head gear for safety during therapy). Additional Information: NA Pt/Family Agrees to Admission and willing to participate: Yes Program Orientation Provided & Reviewed with Pt/Caregiver Including Roles  & Responsibilities: Yes(wife) Additional Information Needs: NA Information Needs to be Provided By:  NA  Barriers to Discharge: Home environment access/layout   Decrease burden of Care through IP rehab admission: NA  Possible need for SNF placement upon discharge: Not anticipated   Patient Condition: This patient's medical and functional status has changed since the consult dated: 04/01/18 in which the Rehabilitation Physician determined and documented that the patient's condition is appropriate for intensive rehabilitative care in an inpatient rehabilitation facility. See "History of Present Illness" (above) for medical update. Functional changes  are: Min A x2 sit to stand and Min A for ambulation in hallway. Patient's medical and functional status update has been discussed with the Rehabilitation physician and patient remains appropriate for inpatient rehabilitation. Will admit to inpatient rehab today.  Preadmission Screen Completed By:  Jhonnie Garner, 04/04/2018 2:46 PM ______________________________________________________________________   Discussed status with Dr. Naaman Plummer on 04/04/18 at 2:45 PM and received telephone approval for admission today.  Admission Coordinator:  Jhonnie Garner, time 2:45PM/Date 04/04/18.

## 2018-04-01 NOTE — Progress Notes (Signed)
Occupational Therapy Treatment Patient Details Name: Barry Miranda MRN: [id number] DOB: 02-18-1961 Today's Date: 04/01/2018    History of present illness  57 year old male with history of hyperlipidemia, presenting to the ED with headache.  CT showing large R SDH in much of the frontotemporoparietal regions.  Pt s/p crani and drain placement. Pt with increased lethargy on 6/19; repeat CT on 6/20 with 1.57mm midline shift increase. He additionally underwent decompressive right frontoparietal craniectomy and implantation of bone flap 03/28/18.    OT comments  Pt progressing towards OT goals, presents supine in bed requiring cues initially to arouse due to lethargy. Pt completed simple grooming ADLs seated EOB, initially requiring modA for static sitting EOB, with increased time pt progressed to maintaining static sitting with minguard assist. Pt completing stand pivot transfer EOB to recliner with MinA+2 (HHA). Pt tending to maintain eyes closed during session requires cues to open and to keep open during task completion. Noted pt also to demonstrate increased difficulty tracking past midline towards L visual field and with decreased depth perception when reaching for items. Pt overall following one step commands during session given increased time and multimodal cues. Feel POC remains appropriate at this time. Will continue to follow acutely to progress pt towards established OT goals.   Pt BP monitored throughout session, initially reading 188/80 while supine, upon sitting EOB 194/84, end of session once seated in recliner 164/81.    Follow Up Recommendations  CIR;Supervision/Assistance - 24 hour    Equipment Recommendations  Other (comment)(TBD in next venue )          Precautions / Restrictions Precautions Precautions: Fall;Other (comment) Precaution Comments: R craniectomy with bone flap removed Restrictions Weight Bearing Restrictions: No       Mobility Bed Mobility Overal bed  mobility: Needs Assistance Bed Mobility: Supine to Sit     Supine to sit: Mod assist;+2 for safety/equipment     General bed mobility comments: cues for sequencing and to initiate movements, assist with BLE and to elevate trunk  Transfers Overall transfer level: Needs assistance Equipment used: 2 person hand held assist Transfers: Sit to/from Stand;Stand Pivot Transfers Sit to Stand: Min assist;+2 physical assistance;+2 safety/equipment Stand pivot transfers: Min assist;+2 physical assistance;+2 safety/equipment       General transfer comment: cues for sequencing, assist to rise and steady in standing; pt taking small shuffling steps to turn and sit in recliner using +2 HHA    Balance Overall balance assessment: Needs assistance Sitting-balance support: Feet supported Sitting balance-Leahy Scale: Fair Sitting balance - Comments: up to modA initially for sitting balance, progressed to minguard with increased time    Standing balance support: Bilateral upper extremity supported;During functional activity Standing balance-Leahy Scale: Poor Standing balance comment: MinA+2 (HHA) for standing balance                           ADL either performed or assessed with clinical judgement   ADL Overall ADL's : Needs assistance/impaired     Grooming: Wash/dry face;Min guard;Minimal assistance;Sitting Grooming Details (indicate cue type and reason): minA for static sitting balance EOB              Lower Body Dressing: Maximal assistance;Sit to/from stand Lower Body Dressing Details (indicate cue type and reason): assist to don socks sitting EOB this session (+2 for safety); MinA+2 for sit<>stand              Functional mobility during ADLs: Minimal assistance;+2  for physical assistance(HHA) General ADL Comments: pt tending to leave eyes closed during session, requires cues to open eyes and to maintain eyes open; pt sitting EOB for simple grooming ADLs and completing  stand pivot to recliner      Vision   Vision Assessment?: Yes Eye Alignment: Within Functional Limits Ocular Range of Motion: Restricted on the left Tracking/Visual Pursuits: Impaired - to be further tested in functional context;Other (comment);Decreased smoothness of vertical tracking;Decreased smoothness of horizontal tracking(pt with significant difficulty tracking past midline towards L visual field ) Additional Comments: pt often maintaining eyes closed during session, requires cue to open eyes and locate target; pt with increased difficulty tracking past midline towards L visual field at this time    Perception     Praxis      Cognition Arousal/Alertness: Lethargic Behavior During Therapy: Flat affect Overall Cognitive Status: Impaired/Different from baseline Area of Impairment: Attention;Following commands;Problem solving                   Current Attention Level: Sustained   Following Commands: Follows one step commands with increased time     Problem Solving: Slow processing;Decreased initiation;Requires verbal cues General Comments: Minimally verbal, keeping eyes closed; overall following one step commands given increased time and multimodal cues                            Pertinent Vitals/ Pain       Pain Assessment: Faces Faces Pain Scale: Hurts a little bit Pain Location: head Pain Descriptors / Indicators: Guarding;Grimacing Pain Intervention(s): Monitored during session;Limited activity within patient's tolerance  Home Living                                          Prior Functioning/Environment              Frequency  Min 3X/week        Progress Toward Goals  OT Goals(current goals can now be found in the care plan section)  Progress towards OT goals: Progressing toward goals  Acute Rehab OT Goals Patient Stated Goal: home per wife OT Goal Formulation: With patient/family Time For Goal Achievement:  04/10/18 Potential to Achieve Goals: Good  Plan Discharge plan remains appropriate    Co-evaluation                 AM-PAC PT "6 Clicks" Daily Activity     Outcome Measure   Help from another person eating meals?: A Little Help from another person taking care of personal grooming?: A Little Help from another person toileting, which includes using toliet, bedpan, or urinal?: A Lot Help from another person bathing (including washing, rinsing, drying)?: A Lot Help from another person to put on and taking off regular upper body clothing?: A Lot Help from another person to put on and taking off regular lower body clothing?: A Lot 6 Click Score: 14    End of Session Equipment Utilized During Treatment: Gait belt  OT Visit Diagnosis: Unsteadiness on feet (R26.81);Pain Pain - part of body: (head)   Activity Tolerance Patient tolerated treatment well;Patient limited by lethargy   Patient Left in chair;with call bell/phone within reach;with chair alarm set;with family/visitor present   Nurse Communication Mobility status        Time: 5188-4166 OT Time Calculation (min): 28 min  Charges: OT  General Charges $OT Visit: 1 Visit OT Treatments $Self Care/Home Management : 23-37 mins  Lou Cal, [place] Pager [phone] 04/01/2018    Raymondo Band 04/01/2018, 12:33 PM

## 2018-04-01 NOTE — Progress Notes (Addendum)
Neurosurgery Progress Note  HA requiring fentanyl x3.  Remains drowsy  EXAM:  BP (!) 168/87   Pulse 80   Temp 98.2 F (36.8 C) (Axillary)   Resp 14   Ht 5\' 4"  (1.626 m)   Wt 69.5 kg (153 lb 3.5 oz)   SpO2 99%   BMI 26.30 kg/m   Drowsy but easily awakened Oriented to self and location but not year Follows commands. MAEW Incision c/d/i  PLAN Stable this am Overly sedating meds complicate neuro exam although last dose ~4 hours ago. Avoid fentanyl and other highly sedating meds for HA. Will trial Fioricet. Added low dose Norco to use as last resort.  Otherwise, continue current care

## 2018-04-01 NOTE — H&P (Signed)
Physical Medicine and Rehabilitation Admission H&P    Chief Complaint  Patient presents with  . Headache  : HPI: Barry Miranda is a 57 year old right-handed male with history of hyperlipidemia and tobacco abuse.  Per chart review and wife patient independent prior to admission living with spouse.  He is an Conservation officer, nature.  Two-level home with bed and bath upstairs.  Presented 03/24/2018 with headache times several days with associated nausea and vomiting as well as photophobia.  Denied any trauma or falls.  Cranial CT scan reviewed, showing right SDH.  Per report, acute 1 cm right holohemispheric subdural hematoma resulting in a 1 cm right to left subfalcine herniation with left lateral ventricle entrapment.  Underwent craniotomy evacuation of hematoma 03/24/2018 per Dr. Vertell Limber.  Postoperative lethargy with follow-up CT demonstrating presence of acute epidural hematoma in the region of previous subdural hematoma and underwent right frontal temporal craniotomy evacuation 03/25/2018 per Dr. Ellene Route.  Patient remained intubated for a short time.  Patient did well until approximately 36 hours after latest surgery with progressive decline somewhat waxing and waning course.  Repeat scan demonstrated continued approximately 12 mm right subdural hematoma and 11 mm midline shift.  There was also effacement of the basal cisterns.  Patient again returned to the OR underwent decompressive right frontal parietal craniectomy implantation of bone flap and subcutaneous pocket 03/28/2018 per Dr. Kathyrn Sheriff.  Maintained on Keppra for seizure prophylaxis with EEG completed 03/28/2018 consistent with generalized and focal left cerebral dysfunction no seizure activity .   He was ultimately extubated 03/29/2018.  Patient had remained on 3% normal saline through 04/03/2018 and stopped.  Physical and occupational therapy evaluations completed and ongoing with recommendations of physical medicine rehab consult.  Patient was admitted for  a comprehensive rehab program.  Review of Systems  Constitutional: Negative for chills and fever.  HENT: Negative for hearing loss.   Eyes: Positive for photophobia.  Respiratory: Negative for cough and shortness of breath.   Cardiovascular: Negative for chest pain, palpitations and leg swelling.  Gastrointestinal: Positive for nausea and vomiting. Negative for constipation.  Genitourinary: Negative for dysuria, flank pain and hematuria.  Musculoskeletal: Positive for myalgias.  Skin: Negative for rash.  Neurological: Positive for headaches.  All other systems reviewed and are negative.  Past Medical History:  Diagnosis Date  . Hyperlipidemia    Past Surgical History:  Procedure Laterality Date  . CHOLECYSTECTOMY  2012  . CRANIECTOMY FOR DEPRESSED SKULL FRACTURE Right 03/28/2018   Procedure: CRANIECTOMY for cerebral edema with Placement of Skull Flap in Abdomen;  Surgeon: Consuella Lose, MD;  Location: [facility];  Service: Neurosurgery;  Laterality: Right;  . CRANIOTOMY Right 03/24/2018   Procedure: CRANIOTOMY, HEMATOMA EVACUATION SUBDURAL, RIGHT SIDE;  Surgeon: Erline Levine, MD;  Location: [facility];  Service: Neurosurgery;  Laterality: Right;  . CRANIOTOMY Right 03/24/2018   Procedure: CRANIOTOMY HEMATOMA EVACUATION EPIDURAL;  Surgeon: Kristeen Miss, MD;  Location: [facility];  Service: Neurosurgery;  Laterality: Right;  . LIPOMA EXCISION     lipoma on forehead   . WISDOM TOOTH EXTRACTION     Family History  Problem Relation Age of Onset  . Hypertension Mother   . Diabetes Mother    Social History:  reports that he has been smoking.  He has been smoking about 0.15 packs per day. He has never used smokeless tobacco. He reports that he drinks about 4.2 oz of alcohol per week. He reports that he does not use drugs. Allergies: No Known Allergies Medications  Prior to Admission  Medication Sig Dispense Refill  . aspirin EC 81 MG tablet Take 81 mg by mouth daily.    . Cholecalciferol  (VITAMIN D) 2000 units CAPS Take 2,000 Units by mouth daily.     . simvastatin (ZOCOR) 20 MG tablet Take 20 mg by mouth every evening.      Drug Regimen Review Drug regimen was reviewed and remains appropriate with no significant issues identified  Home: Home Living Family/patient expects to be discharged to:: Private residence Living Arrangements: Spouse/significant other, Children Available Help at Discharge: Family, Available 24 hours/day Type of Home: House Home Access: Level entry Home Layout: Two level, Bed/bath upstairs Alternate Level Stairs-Number of Steps: flight Alternate Level Stairs-Rails: Right, Left Bathroom Shower/Tub: Multimedia programmer: Standard Home Equipment: None   Functional History: Prior Function Level of Independence: Independent Comments: Agricultural consultant Status:  Mobility: Bed Mobility Overal bed mobility: Needs Assistance Bed Mobility: Supine to Sit Supine to sit: Mod assist, +2 for safety/equipment Sit to supine: Min assist General bed mobility comments: cues for sequencing and to initiate movements, assist with BLE and to elevate trunk Transfers Overall transfer level: Needs assistance Equipment used: 2 person hand held assist Transfers: Sit to/from Stand, Stand Pivot Transfers Sit to Stand: Min assist, +2 physical assistance, +2 safety/equipment Stand pivot transfers: Min assist, +2 physical assistance, +2 safety/equipment General transfer comment: cues for sequencing, assist to rise and steady in standing; pt taking small shuffling steps to turn and sit in recliner using +2 HHA Ambulation/Gait Ambulation/Gait assistance: Min assist Gait Distance (Feet): 2 Feet Assistive device: 2 person hand held assist Gait Pattern/deviations: Step-through pattern, Decreased stride length General Gait Details: Side step to left to [facility].  Nursing did not want pt OOB.  BP was elevated and incr with sitting up therefore sat EOB, stood  briefly and then laid pt back down.  Gait velocity: decreased Gait velocity interpretation: <1.31 ft/sec, indicative of household ambulator Stairs: Yes Stairs assistance: Supervision Stair Management: No rails, Alternating pattern, Backwards, Forwards Number of Stairs: 4 General stair comments: steady without rail, but guarded.    ADL: ADL Overall ADL's : Needs assistance/impaired Eating/Feeding: Minimal assistance, Sitting Eating/Feeding Details (indicate cue type and reason): for ice chips and suction, cues for initiation Grooming: Wash/dry face, Min guard, Minimal assistance, Sitting Grooming Details (indicate cue type and reason): minA for static sitting balance EOB  Upper Body Bathing: Minimal assistance, Sitting Lower Body Bathing: Moderate assistance, Sit to/from stand Lower Body Bathing Details (indicate cue type and reason): limited by HA  Upper Body Dressing : Minimal assistance, Sitting Lower Body Dressing: Maximal assistance, Sit to/from stand Lower Body Dressing Details (indicate cue type and reason): assist to don socks sitting EOB this session (+2 for safety); MinA+2 for sit<>stand  Toilet Transfer: Minimal assistance, +2 for physical assistance, Stand-pivot Toilet Transfer Details (indicate cue type and reason): simulated by stand pivot EOB to chair. Pt able to stand with min assist to void in condom cath Toileting- Clothing Manipulation and Hygiene: Minimal assistance, Sit to/from stand Functional mobility during ADLs: Minimal assistance, +2 for physical assistance(HHA) General ADL Comments: pt tending to leave eyes closed during session, requires cues to open eyes and to maintain eyes open; pt sitting EOB for simple grooming ADLs and completing stand pivot to recliner   Cognition: Cognition Overall Cognitive Status: Impaired/Different from baseline Orientation Level: Oriented to person, Disoriented to place, Disoriented to time, Disoriented to  situation Cognition Arousal/Alertness: Lethargic Behavior During Therapy:  Flat affect Overall Cognitive Status: Impaired/Different from baseline Area of Impairment: Attention, Following commands, Problem solving Current Attention Level: Sustained Following Commands: Follows one step commands with increased time Problem Solving: Slow processing, Decreased initiation, Requires verbal cues General Comments: Minimally verbal, keeping eyes closed; overall following one step commands given increased time and multimodal cues    Physical Exam: Blood pressure (!) 163/77, pulse 98, temperature 99.2 F (37.3 C), temperature source Axillary, resp. rate 16, height 5\' 4"  (1.626 m), weight 69.5 kg (153 lb 3.5 oz), SpO2 99 %. Physical Exam  Vitals reviewed. Constitutional: No distress.  HENT:  Large craniectomy site with staples intact  Eyes: Pupils are equal, round, and reactive to light. EOM are normal.  Neck: Normal range of motion. No thyromegaly present.  Cardiovascular: Normal rate and regular rhythm. Exam reveals no friction rub.  Respiratory: Effort normal. No respiratory distress. He has no wheezes.  GI: He exhibits no distension. There is no tenderness.  Musculoskeletal: Normal range of motion. He exhibits no edema.  Neurological: He is alert.  Oriented to name, hospital, "brain problem". Follows basic commands, language appears intact. Very alert. Decreased insight and awareness. LUE  3+ to 4-/5. LLE 3+ to 4-/5. RUE and RLE 4+/5. Senses pain in all 4's.   Skin: Skin is warm and dry.  Psychiatric:  Flat but cooperative    Results for orders placed or performed during the hospital encounter of 03/23/18 (from the past 48 hour(s))  Sodium     Status: None   Collection Time: 03/30/18  6:20 PM  Result Value Ref Range   Sodium 141 135 - 145 mmol/L    Comment: Performed at [facility], [place] [address] [place], [place] [address]  Sodium     Status: Abnormal   Collection Time: 03/30/18  11:57 PM  Result Value Ref Range   Sodium 147 (H) 135 - 145 mmol/L    Comment: Performed at [facility], [place] [address], [place], [place] [address]  Sodium     Status: Abnormal   Collection Time: 03/31/18  5:42 AM  Result Value Ref Range   Sodium 148 (H) 135 - 145 mmol/L    Comment: Performed at [facility], [place] [address] [place], [place] [address]  Sodium     Status: Abnormal   Collection Time: 03/31/18 12:32 PM  Result Value Ref Range   Sodium 149 (H) 135 - 145 mmol/L    Comment: Performed at [facility], [place] [address], [place], [place] [address]  Sodium     Status: None   Collection Time: 03/31/18  6:11 PM  Result Value Ref Range   Sodium 144 135 - 145 mmol/L    Comment: Performed at [place] [address], [place], [place] [address]  Sodium     Status: Abnormal   Collection Time: 03/31/18 11:36 PM  Result Value Ref Range   Sodium 147 (H) 135 - 145 mmol/L    Comment: Performed at [facility], [place] [address], [place],  [address]  Sodium     Status: None   Collection Time: 04/01/18  6:00 AM  Result Value Ref Range   Sodium 145 135 - 145 mmol/L    Comment: Performed at [place] [address], [place],  [address]  Sodium     Status: Abnormal   Collection Time: 04/01/18 12:10 PM  Result Value Ref Range   Sodium 147 (H)  135 - 145 mmol/L    Comment: Performed at [facility], [place] [address], [place], [place] [address]   No results found.     Medical Problem List and Plan: 1.  Decreased functional mobility with apraxia secondary to right SDH/EDH with initial craniotomy evacuation hematoma 2/45/8099 complicated by epidural hematoma with evacuation 03/25/2018 and follow-up decompressive right frontal parietal craniectomy implantation of bone flap 03/28/2018.    -admit to inpatient rehab  -Will place order for helmet for protection (discussed with pt/wife) 2.  DVT Prophylaxis/Anticoagulation: SDH.  Monitor  for any signs of DVT 3. Pain Management: Hydrocodone and Fioricet as needed. 4. Mood: Provide emotional support 5. Neuropsych: This patient is capable of making decisions on his own behalf. 6. Skin/Wound Care: Routine skin checks 7. Fluids/Electrolytes/Nutrition: Routine in and outs with follow-up chemistries 8.  Seizure prophylaxis.  Keppra 1000 mg every 12 hours as his seizure risk is high 9.  Hyperlipidemia.  Zocor 10.  Constipation.  Laxative assistance  Post Admission Physician Evaluation: 1. Functional deficits secondary  to non-traumatic subdural hematoma. 2. Patient is admitted to receive collaborative, interdisciplinary care between the physiatrist, rehab nursing staff, and therapy team. 3. Patient's level of medical complexity and substantial therapy needs in context of that medical necessity cannot be provided at a lesser intensity of care such as a SNF. 4. Patient has experienced substantial functional loss from his/her baseline which was documented above under the "Functional History" and "Functional Status" headings.  Judging by the patient's diagnosis, physical exam, and functional history, the patient has potential for functional progress which will result in measurable gains while on inpatient rehab.  These gains will be of substantial and practical use upon discharge  in facilitating mobility and self-care at the household level. 5. Physiatrist will provide 24 hour management of medical needs as well as oversight of the therapy plan/treatment and provide guidance as appropriate regarding the interaction of the two. 6. The Preadmission Screening has been reviewed and patient status is unchanged unless otherwise stated above. 7. 24 hour rehab nursing will assist with bladder management, bowel management, safety, skin/wound care, disease management, medication administration, pain management and patient education  and help integrate therapy concepts, techniques,education, etc. 8. PT  will assess and treat for/with: Lower extremity strength, range of motion, stamina, balance, functional mobility, safety, adaptive techniques and equipment, NMR, visual-spatial awareness, family ed.   Goals are: supervision. 9. OT will assess and treat for/with: ADL's, functional mobility, safety, upper extremity strength, adaptive techniques and equipment, NMR, family ed, ego support.   Goals are: supervision to min assist. Therapy may proceed with showering this patient. 10. SLP will assess and treat for/with: cognition, communication, family ed.  Goals are: supervision to min assist. 11. Case Management and Social Worker will assess and treat for psychological issues and discharge planning. 12. Team conference will be held weekly to assess progress toward goals and to determine barriers to discharge. 13. Patient will receive at least 3 hours of therapy per day at least 5 days per week. 14. ELOS: 12-17 days       15. Prognosis:  excellent    I have personally performed a face to face diagnostic evaluation of this patient and formulated the key components of the plan.  Additionally, I have personally reviewed laboratory data, imaging studies, as well as relevant notes and concur with the physician assistant's documentation above. This exam and assessment was performed on 04/04/18  Meredith Staggers, MD, Mellody Drown   Quillian Quince  J Angiulli, PA-C 04/01/2018

## 2018-04-01 NOTE — Consult Note (Signed)
Physical Medicine and Rehabilitation Consult Reason for Consult: Decreased functional mobility Referring Physician: Dr. Vertell Limber   HPI: Barry Miranda is a 57 y.o. right-handed male with history of hyperlipidemia and tobacco abuse.  Per chart review and wife, patient independent prior to admission living with spouse.  He is an Conservation officer, nature.  Two-level home with bed and bath upstairs.  Presented 03/24/2018 with headache times several days with associated nausea and vomiting as well as photophobia.  Denied any head trauma or falls.  Cranial CT reviewed, showing right SDH.  Per report, acute 1 cm right holohemispheric subdural hematoma resulting in a 1 cm right to left subfalcine herniation with left lateral ventricle entrapment.  Underwent craniotomy evacuation of hematoma 03/24/2018 per Dr. Vertell Limber.  Postoperative lethargy with follow-up CT performed demonstrating presence of acute epidural hematoma in the region of previous subdural hematoma and underwent right frontal temporal craniotomy evacuation of epidural hematoma 03/25/2018 per Dr. Ellene Route.  Patient remained intubated for a short time.  Patient did well after return to the OR however within 36 hours progressive decline somewhat waxing and waning course.  Repeat scan demonstrated continued approximately 12 mm right subdural hematoma and 11 mm midline shift.  There was also effacement of the basal cisterns.  Patient returned again to the OR underwent decompressive right frontal parietal craniectomy implantation of bone flap and subcutaneous pocket 03/28/2018 per Dr. Kathyrn Sheriff.  Maintained on Keppra for seizure prophylaxis with EEG completed 03/28/2018 consistent with generalized and focal left cerebral dysfunction suggesting underlying left hemispheric structural defect no seizure activity noted patient's Keppra was adjusted.  Patient was ultimately extubated 03/29/2018.  Currently on a full liquid diet.  Physical and occupational therapy evaluations  completed with recommendations of physical medicine rehab consult.   Review of Systems  Unable to perform ROS: Mental acuity   Past Medical History:  Diagnosis Date  . Hyperlipidemia    Past Surgical History:  Procedure Laterality Date  . CHOLECYSTECTOMY  2012  . CRANIECTOMY FOR DEPRESSED SKULL FRACTURE Right 03/28/2018   Procedure: CRANIECTOMY for cerebral edema with Placement of Skull Flap in Abdomen;  Surgeon: Consuella Lose, MD;  Location: [place];  Service: Neurosurgery;  Laterality: Right;  . CRANIOTOMY Right 03/24/2018   Procedure: CRANIOTOMY, HEMATOMA EVACUATION SUBDURAL, RIGHT SIDE;  Surgeon: Erline Levine, MD;  Location: [place];  Service: Neurosurgery;  Laterality: Right;  . CRANIOTOMY Right 03/24/2018   Procedure: CRANIOTOMY HEMATOMA EVACUATION EPIDURAL;  Surgeon: Kristeen Miss, MD;  Location: [place];  Service: Neurosurgery;  Laterality: Right;  . LIPOMA EXCISION     lipoma on forehead   . WISDOM TOOTH EXTRACTION     Family History  Problem Relation Age of Onset  . Hypertension Mother   . Diabetes Mother    Social History:  reports that he has been smoking.  He has been smoking about 0.15 packs per day. He has never used smokeless tobacco. He reports that he drinks about 4.2 oz of alcohol per week. He reports that he does not use drugs. Allergies: No Known Allergies Medications Prior to Admission  Medication Sig Dispense Refill  . aspirin EC 81 MG tablet Take 81 mg by mouth daily.    . Cholecalciferol (VITAMIN D) 2000 units CAPS Take 2,000 Units by mouth daily.     . simvastatin (ZOCOR) 20 MG tablet Take 20 mg by mouth every evening.      Home: Home Living Family/patient expects to be discharged to:: Private residence Living Arrangements: Spouse/significant  other, Children Available Help at Discharge: Family, Available 24 hours/day Type of Home: House Home Access: Level entry Home Layout: Two level, Bed/bath upstairs Alternate Level Stairs-Number of Steps:  flight Alternate Level Stairs-Rails: Right, Left Bathroom Shower/Tub: Multimedia programmer: Standard Home Equipment: None  Functional History: Prior Function Level of Independence: Independent Comments: Conservation officer, nature Functional Status:  Mobility: Bed Mobility Overal bed mobility: Needs Assistance Bed Mobility: Supine to Sit Supine to sit: Mod assist Sit to supine: Min assist General bed mobility comments: cues for sequencing, assist with BLE and to elevate trunk Transfers Overall transfer level: Needs assistance Equipment used: 2 person hand held assist Transfers: Sit to/from Stand Sit to Stand: Min assist General transfer comment: cues for sequencing Ambulation/Gait Ambulation/Gait assistance: Min assist Gait Distance (Feet): 2 Feet Assistive device: 2 person hand held assist Gait Pattern/deviations: Step-through pattern, Decreased stride length General Gait Details: Side step to left to [facility].  Nursing did not want pt OOB.  BP was elevated and incr with sitting up therefore sat EOB, stood briefly and then laid pt back down.  Gait velocity: decreased Gait velocity interpretation: <1.31 ft/sec, indicative of household ambulator Stairs: Yes Stairs assistance: Supervision Stair Management: No rails, Alternating pattern, Backwards, Forwards Number of Stairs: 4 General stair comments: steady without rail, but guarded.    ADL: ADL Overall ADL's : Needs assistance/impaired Eating/Feeding: Minimal assistance, Sitting Eating/Feeding Details (indicate cue type and reason): for ice chips and suction, cues for initiation Grooming: Wash/dry hands, Wash/dry face, Oral care, Minimal assistance, Standing Upper Body Bathing: Minimal assistance, Sitting Lower Body Bathing: Moderate assistance, Sit to/from stand Lower Body Bathing Details (indicate cue type and reason): limited by HA  Upper Body Dressing : Minimal assistance, Sitting Lower Body Dressing: Maximal assistance,  Sit to/from stand Lower Body Dressing Details (indicate cue type and reason): to don socks Toilet Transfer: Minimal assistance, +2 for physical assistance, Stand-pivot Toilet Transfer Details (indicate cue type and reason): simulated by stand pivot EOB to chair. Pt able to stand with min assist to void in condom cath Toileting- Clothing Manipulation and Hygiene: Minimal assistance, Sit to/from stand Functional mobility during ADLs: Minimal assistance, +2 for physical assistance(HHA) General ADL Comments: Pt with increased lethargy today and severe headache and reporting nausea. RN presenting and assisting--gave pain and nausea medicine. Session limited due to pain and medical complications. VSS throughout.  Cognition: Cognition Overall Cognitive Status: Impaired/Different from baseline Orientation Level: Oriented to person, Oriented to place, Oriented to time, Oriented to situation Cognition Arousal/Alertness: Lethargic Behavior During Therapy: Flat affect Overall Cognitive Status: Impaired/Different from baseline Area of Impairment: Attention, Following commands, Problem solving Current Attention Level: Sustained Following Commands: Follows one step commands with increased time Problem Solving: Slow processing, Decreased initiation, Requires verbal cues General Comments: Minimally verbal, keeping eyes closed.   Blood pressure (!) 141/77, pulse 74, temperature 98.2 F (36.8 C), temperature source Axillary, resp. rate 14, height 5\' 4"  (1.626 m), weight 69.5 kg (153 lb 3.5 oz), SpO2 99 %. Physical Exam  Vitals reviewed. Constitutional: He appears well-developed and well-nourished.  HENT:  Craniotomy site dressed  Eyes: Right eye exhibits no discharge. Left eye exhibits no discharge.  Pupils sluggish but reactive to light  Neck: Normal range of motion. Neck supple. No thyromegaly present.  Cardiovascular: Normal rate and regular rhythm.  Respiratory: Effort normal and breath sounds  normal. No respiratory distress.  GI: Soft. Bowel sounds are normal. He exhibits no distension.  Musculoskeletal:  No edema or tenderness in extremities  Neurological:  Very lethargic, briefly follows some commands Appears to have apraxia Motor: (limited by lethargy): ?>/4/5 throughout  Skin: Skin is warm and dry.  Staples in place c/d/i  Psychiatric:  Unable to assess due to lethargy    Results for orders placed or performed during the hospital encounter of 03/23/18 (from the past 24 hour(s))  Sodium     Status: Abnormal   Collection Time: 03/31/18  5:42 AM  Result Value Ref Range   Sodium 148 (H) 135 - 145 mmol/L  Sodium     Status: Abnormal   Collection Time: 03/31/18 12:32 PM  Result Value Ref Range   Sodium 149 (H) 135 - 145 mmol/L  Sodium     Status: None   Collection Time: 03/31/18  6:11 PM  Result Value Ref Range   Sodium 144 135 - 145 mmol/L  Sodium     Status: Abnormal   Collection Time: 03/31/18 11:36 PM  Result Value Ref Range   Sodium 147 (H) 135 - 145 mmol/L   No results found.  Assessment/Plan: Diagnosis: Right SDH/EDH Labs and images independently reviewed.  Records reviewed and summated above.  1. Does the need for close, 24 hr/day medical supervision in concert with the patient's rehab needs make it unreasonable for this patient to be served in a less intensive setting? Yes  2. Co-Morbidities requiring supervision/potential complications: hyperlipidemia (cont meds), tobacco abuse (counsel), Tachycardia (monitor in accordance with pain and increasing activity), HTN (monitor and provide prns in accordance with increased physical exertion and pain), hypernatremia (cont to monitor, wean 3% Nacl as appropriate), ABLA (transfuse if necessary to ensure appropriate perfusion for increased activity tolerance) 3. Due to bladder management, safety, skin/wound care, disease management, medication administration, pain management and patient education, does the patient  require 24 hr/day rehab nursing? Yes 4. Does the patient require coordinated care of a physician, rehab nurse, PT (1-2 hrs/day, 5 days/week), OT (1-2 hrs/day, 5 days/week) and SLP (1-2 hrs/day, 5 days/week) to address physical and functional deficits in the context of the above medical diagnosis(es)? Yes Addressing deficits in the following areas: balance, endurance, locomotion, strength, transferring, bathing, dressing, toileting, cognition, speech and psychosocial support 5. Can the patient actively participate in an intensive therapy program of at least 3 hrs of therapy per day at least 5 days per week? Potentially 6. The potential for patient to make measurable gains while on inpatient rehab is excellent 7. Anticipated functional outcomes upon discharge from inpatient rehab are supervision and min assist  with PT, supervision and min assist with OT, modified independent and supervision with SLP. 8. Estimated rehab length of stay to reach the above functional goals is: 12-16 days. 9. Anticipated D/C setting: Home 10. Anticipated post D/C treatments: HH therapy and Home excercise program 11. Overall Rehab/Functional Prognosis: good  RECOMMENDATIONS: This patient's condition is appropriate for continued rehabilitative care in the following setting: Potentially CIR.  Will monitor functional progress as pt becomes medically stable and ready for discharge.  Patient has agreed to participate in recommended program. Potentially Note that insurance prior authorization may be required for reimbursement for recommended care.  Comment: Rehab Admissions Coordinator to follow up.   I have personally performed a face to face diagnostic evaluation, including, but not limited to relevant history and physical exam findings, of this patient and developed relevant assessment and plan.  Additionally, I have reviewed and concur with the physician assistant's documentation above.   Delice Lesch, MD, ABPMR Lavon Paganini  Angiulli, PA-C 04/01/2018

## 2018-04-01 NOTE — Progress Notes (Signed)
Physical Therapy Treatment Patient Details Name: Barry Miranda MRN: [id number] DOB: 03-Mar-1961 Today's Date: 04/01/2018    History of Present Illness  57 year old male with history of hyperlipidemia, presenting to the ED with headache.  CT showing large R SDH in much of the frontotemporoparietal regions.  Pt s/p crani and drain placement. Pt with increased lethargy on 6/19; repeat CT on 6/20 with 1.84mm midline shift increase. He additionally underwent decompressive right frontoparietal craniectomy and implantation of bone flap 03/28/18.     PT Comments    Patient not progressing this session needing increased assist for sit to stand and chair to bed.  Seemed as if fatigued from sitting up in recliner.  Possibly also elevated BP, though not checked end of session.  Feel he may benefit from CIR if able to increase level of arousal prior to d/c.  PT to follow acutely.   Follow Up Recommendations  CIR     Equipment Recommendations  Other (comment)(TBA)    Recommendations for Other Services       Precautions / Restrictions Precautions Precautions: Fall;Other (comment) Precaution Comments: R craniectomy with bone flap removed Restrictions Weight Bearing Restrictions: No    Mobility  Bed Mobility Overal bed mobility: Needs Assistance Bed Mobility: Supine to Sit     Supine to sit: Mod assist;+2 for safety/equipment Sit to supine: Mod assist;+2 for physical assistance   General bed mobility comments: assist to lower trunk and for legs onto bed  Transfers Overall transfer level: Needs assistance Equipment used: 2 person hand held assist Transfers: Sit to/from [employer] Transfers Sit to Stand: Mod assist;+2 physical assistance Stand pivot transfers: +2 physical assistance;Mod assist       General transfer comment: lifting assist from chair, assist to place feet under him and to keep R foot on the floor; assist for stand step with posterior bias to bed  Ambulation/Gait              General Gait Details: unable due to posterior bias, feet too close and unable to position despite cues and weight shift facilitation   Stairs             Wheelchair Mobility    Modified Rankin (Stroke Patients Only) Modified Rankin (Stroke Patients Only) Pre-Morbid Rankin Score: No symptoms Modified Rankin: Severe disability     Balance Overall balance assessment: Needs assistance Sitting-balance support: Feet supported Sitting balance-Leahy Scale: Poor Sitting balance - Comments: leaning posterior, assist to keep forward   Standing balance support: Bilateral upper extremity supported;During functional activity Standing balance-Leahy Scale: Zero Standing balance comment: mod A of 2 for standing due to leaning posterior                            Cognition Arousal/Alertness: Lethargic Behavior During Therapy: Flat affect Overall Cognitive Status: Impaired/Different from baseline Area of Impairment: Attention;Following commands;Problem solving                   Current Attention Level: Focused   Following Commands: Follows one step commands with increased time;Follows one step commands inconsistently     Problem Solving: Slow processing;Decreased initiation;Difficulty sequencing;Requires tactile cues;Requires verbal cues General Comments: Minimally verbal, keeping eyes closed; overall following one step commands given increased time and multimodal cues        Exercises      General Comments General comments (skin integrity, edema, etc.): wife arrived end of session; RN made aware of HR up  to 144 with pain behaviors after returned to supine      Pertinent Vitals/Pain Pain Assessment: Faces Faces Pain Scale: Hurts whole lot Pain Location: head Pain Descriptors / Indicators: Guarding;Grimacing(elevated HR) Pain Intervention(s): Monitored during session;Repositioned;Patient requesting pain meds-RN notified    Home Living                       Prior Function            PT Goals (current goals can now be found in the care plan section) Acute Rehab PT Goals Patient Stated Goal: home per wife Progress towards PT goals: Not progressing toward goals - comment(limited by lethargy, increased assist needed this session)    Frequency    Min 4X/week      PT Plan Current plan remains appropriate    Co-evaluation              AM-PAC PT "6 Clicks" Daily Activity  Outcome Measure  Difficulty turning over in bed (including adjusting bedclothes, sheets and blankets)?: Unable Difficulty moving from lying on back to sitting on the side of the bed? : Unable Difficulty sitting down on and standing up from a chair with arms (e.g., wheelchair, bedside commode, etc,.)?: Unable Help needed moving to and from a bed to chair (including a wheelchair)?: A Lot Help needed walking in hospital room?: Total Help needed climbing 3-5 steps with a railing? : Total 6 Click Score: 7    End of Session Equipment Utilized During Treatment: Gait belt Activity Tolerance: Patient limited by lethargy Patient left: in bed;with call bell/phone within reach;with family/visitor present;with bed alarm set;with nursing/sitter in room   PT Visit Diagnosis: Other abnormalities of gait and mobility (R26.89);Unsteadiness on feet (R26.81);Other symptoms and signs involving the nervous system (R29.898)     Time: 3845-3646 PT Time Calculation (min) (ACUTE ONLY): 24 min  Charges:  $Therapeutic Activity: 23-37 mins                    G CodesMagda Kiel, [place] [phone] 04/01/2018    Reginia Naas 04/01/2018, 4:16 PM

## 2018-04-01 NOTE — Progress Notes (Signed)
Inpatient Rehabilitation-Admissions Coordinator    Met with patient and wife at the bedside (pt kept eyes closed and resting after having just worked with therapy) to discuss team's recommendation for inpatient rehabilitation. Shared booklets, expectations while in CIR, expected length of stay, and anticipated functional level at DC. Wife interested in [place]. Plan to follow for timing of medical readiness and IP Rehab bed availability. AC will initiate insurance authorization once medically appropriate for rehab. Call if questions.   Jhonnie Garner, OTR/L  Rehab Admissions Coordinator  [phone] 04/01/2018 2:27 PM

## 2018-04-02 LAB — SODIUM
SODIUM: 147 mmol/L — AB (ref 135–145)
Sodium: 147 mmol/L — ABNORMAL HIGH (ref 135–145)
Sodium: 147 mmol/L — ABNORMAL HIGH (ref 135–145)
Sodium: 146 mmol/L — ABNORMAL HIGH (ref 135–145)

## 2018-04-02 MED ORDER — FAMOTIDINE 20 MG PO TABS
20.0000 mg | ORAL_TABLET | Freq: Two times a day (BID) | ORAL | Status: DC
  Administered 2018-04-02 – 2018-04-04 (×4): 20 mg via ORAL
  Filled 2018-04-02 (×4): qty 1

## 2018-04-02 NOTE — Progress Notes (Signed)
  Speech Language Pathology Treatment: Dysphagia  Patient Details Name: Barry Miranda MRN: [id number] DOB: 08/26/1961 Today's Date: 04/02/2018 Time: 3567-0141 SLP Time Calculation (min) (ACUTE ONLY): 13 min  Assessment / Plan / Recommendation Clinical Impression  Pt adequate alert for Po intake; has been tolerating full liquids and feels hungry for solids. No signs of difficulty despite mild lethargy. Pt able to self feed with min verbal cues for problem solving. No SLP f/u for dysphagia. Pt needs cognitive assessment orders. SLP eval and treat - cognition.   HPI        SLP Plan  Continue with current plan of care       Recommendations  Diet recommendations: Regular;Thin liquid Liquids provided via: Cup;Straw Medication Administration: Whole meds with liquid Supervision: Full supervision/cueing for compensatory strategies Postural Changes and/or Swallow Maneuvers: Seated upright 90 degrees                Follow up Recommendations: Inpatient Rehab SLP Visit Diagnosis: Dysphagia, unspecified (R13.10) Plan: Continue with current plan of care       GO              [facility], [place] CCC-SLP [phone]   Barry Miranda 04/02/2018, 1:09 PM

## 2018-04-02 NOTE — Progress Notes (Signed)
Neurosurgery Progress Note  No issues overnight. Wife at bedside reports he was much more talkative overnight. Seems to have days/nights switched. Able to feel self breakfast.   EXAM:  BP 134/72   Pulse 77   Temp 98 F (36.7 C) (Oral)   Resp 16   Ht 5\' 4"  (1.626 m)   Wt 69.5 kg (153 lb 3.5 oz)   SpO2 98%   BMI 26.30 kg/m   Drowsy but easily awakened Feeding self at bedside with SLP MAEW. Follows commands. Incision c/d/i  PLAN Reportedly mental status much improved through the night but now drowsy. Will work on trying to get days/nights back in order. Will continue current care otherwise.

## 2018-04-03 LAB — SODIUM
SODIUM: 142 mmol/L (ref 135–145)
SODIUM: 144 mmol/L (ref 135–145)
SODIUM: 145 mmol/L (ref 135–145)
Sodium: 142 mmol/L (ref 135–145)

## 2018-04-03 MED ORDER — LEVETIRACETAM 500 MG PO TABS
1000.0000 mg | ORAL_TABLET | Freq: Two times a day (BID) | ORAL | Status: DC
  Administered 2018-04-03 – 2018-04-04 (×2): 1000 mg via ORAL
  Filled 2018-04-03 (×2): qty 2

## 2018-04-03 NOTE — Progress Notes (Signed)
Occupational Therapy Treatment Patient Details Name: Barry Miranda MRN: [id number] DOB: 1961/02/17 Today's Date: 04/03/2018    History of present illness  57 year old male with history of hyperlipidemia, presenting to the ED with headache.  CT showing large R SDH in much of the frontotemporoparietal regions.  Pt s/p crani and drain placement. Pt with increased lethargy on 6/19; repeat CT on 6/20 with 1.41mm midline shift increase. He additionally underwent decompressive right frontoparietal craniectomy and implantation of bone flap 03/28/18.    OT comments  Pt progressing towards OT goals, presents sitting up in bed, pleasant, and agreeable to OT tx session. Pt much more awake/alert this session. Pt demonstrating bed mobility with overall minguard assist, sit<>stand and stand pivot transfer using RW with minA this session (+2 for safety/lines). Pt requiring MinA for LB dressing, increased time and effort and cues for using both L and R UE to assist. Requires verbal safety cues as pt demonstrating increased impulsivity during mobility; overall following one step commands given increased time and cues. Feel pt remains an excellent candidate for CIR level services. Will continue to follow acutely to progress pt towards established OT goals.   Follow Up Recommendations  CIR;Supervision/Assistance - 24 hour    Equipment Recommendations  Other (comment)(TBD in next venue )          Precautions / Restrictions Precautions Precautions: Fall;Other (comment) Precaution Comments: R craniectomy with bone flap removed Restrictions Weight Bearing Restrictions: No       Mobility Bed Mobility Overal bed mobility: Needs Assistance Bed Mobility: Supine to Sit     Supine to sit: Min guard     General bed mobility comments: minguard for safety with HOB elevated; requires additional cues for safety as pt attempting to move towards EOB prior to bedrail being lowered and lines in  place  Transfers Overall transfer level: Needs assistance Equipment used: Rolling walker (2 wheeled) Transfers: Sit to/from [facility] Sit to Stand: Min assist;+2 safety/equipment Stand pivot transfers: Min assist;+2 safety/equipment       General transfer comment: verbal cues for safe hand placement; pt able to rise into standing with steadying assist; verbal cues for hand placement on RW and for sequencing RW use during transfer to recliner     Balance Overall balance assessment: Needs assistance Sitting-balance support: Feet supported Sitting balance-Leahy Scale: Good Sitting balance - Comments: intermittent minA for dynamic balance; minguard for static sitting    Standing balance support: Bilateral upper extremity supported;During functional activity Standing balance-Leahy Scale: Poor Standing balance comment: reliant on UE support/external support at this time                            ADL either performed or assessed with clinical judgement   ADL Overall ADL's : Needs assistance/impaired Eating/Feeding: Supervision/ safety;Sitting Eating/Feeding Details (indicate cue type and reason): pt self-feeding with supervision of wife start and end of session Grooming: Wash/dry face;Set up;Sitting Grooming Details (indicate cue type and reason): using RUE to complete              Lower Body Dressing: Minimal assistance;Sit to/from stand Lower Body Dressing Details (indicate cue type and reason): pt donning socks seated EOB; intermittent minA for dynamic sitting balance; increased time and effort to don L sock, verbal cues to use both hands to assist; minA for sit<>stand              Functional mobility during ADLs: Minimal assistance;+2 for  safety/equipment;Rolling walker General ADL Comments: pt much more awake/alert this session; completing bed mobility and transfer to recliner to finish breakfast during session     Vision   Additional  Comments: pt continues to demonstrate difficulty tracking past midline towards L visual field, but given multiple attempts pt able to do so, tracking horizontally does not track vertically in L visual fields               Cognition Arousal/Alertness: Awake/alert Behavior During Therapy: [facility] for tasks assessed/performed;Flat affect;Impulsive Overall Cognitive Status: Impaired/Different from baseline Area of Impairment: Attention;Following commands;Problem solving                   Current Attention Level: Sustained   Following Commands: Follows one step commands with increased time     Problem Solving: Slow processing;Decreased initiation;Difficulty sequencing;Requires tactile cues;Requires verbal cues General Comments: pt much more awake/alert compared to previous treatment session; pt responding to therapist's questions and maintaining eyes open more throughout session; overall following one step commands given increased time and multimodal cues; does require verbal safety cues this session as pt eager to get to EOB and OOB         Exercises     Shoulder Instructions       General Comments spouse present during session; BP 140/60 end of session while seated in recliner     Pertinent Vitals/ Pain       Pain Assessment: Faces Faces Pain Scale: Hurts a little bit Pain Location: head Pain Descriptors / Indicators: Headache Pain Intervention(s): Monitored during session  Home Living                                          Prior Functioning/Environment              Frequency  Min 3X/week        Progress Toward Goals  OT Goals(current goals can now be found in the care plan section)  Progress towards OT goals: Progressing toward goals  Acute Rehab OT Goals Patient Stated Goal: home OT Goal Formulation: With patient/family Time For Goal Achievement: 04/10/18 Potential to Achieve Goals: Good  Plan Discharge plan remains appropriate     Co-evaluation                 AM-PAC PT "6 Clicks" Daily Activity     Outcome Measure   Help from another person eating meals?: A Little Help from another person taking care of personal grooming?: A Little Help from another person toileting, which includes using toliet, bedpan, or urinal?: A Lot Help from another person bathing (including washing, rinsing, drying)?: A Lot Help from another person to put on and taking off regular upper body clothing?: A Little Help from another person to put on and taking off regular lower body clothing?: A Lot 6 Click Score: 15    End of Session Equipment Utilized During Treatment: Gait belt;Rolling walker  OT Visit Diagnosis: Unsteadiness on feet (R26.81);Pain Pain - part of body: (head )   Activity Tolerance Patient tolerated treatment well   Patient Left in chair;with call bell/phone within reach;with chair alarm set;with family/visitor present   Nurse Communication Mobility status        Time: 2694-8546 OT Time Calculation (min): 16 min  Charges: OT General Charges $OT Visit: 1 Visit OT Treatments $Self Care/Home Management : 8-22 mins  Bubba Hales  Megan Salon, [place] Pager [phone] 04/03/2018    Raymondo Band 04/03/2018, 9:26 AM

## 2018-04-03 NOTE — Progress Notes (Signed)
Inpatient Rehabilitation-Admissions Coordinator   Noted pt is much more alert today and demonstrated improved activity tolerance. Both pt and wife still want to pursue CIR. AC began insurance authorization process for possible admit tomorrow, pending medical readiness. Please call if questions.   Jhonnie Garner, OTR/L  Rehab Admissions Coordinator  [phone] 04/03/2018 12:16 PM

## 2018-04-03 NOTE — Progress Notes (Signed)
Neurosurgery Progress Note  No issues overnight. Complains of headache and thirst No other concerns this am  EXAM:  BP (!) 120/59   Pulse 77   Temp 98.5 F (36.9 C) (Oral)   Resp 16   Ht 5\' 4"  (1.626 m)   Wt 69.5 kg (153 lb 3.5 oz)   SpO2 98%   BMI 26.30 kg/m   Awake, alert, oriented  Speech fluent, appropriate  CN grossly intact  MAEW with good strength Incision c/d/i  PLAN Much improved this am neurologically. Will d/c 3% today. Keep central line at least until tomorrow. Hopefully he remains stable/continues to improve and can be d/c to rehab in the next several days. Continue current care.

## 2018-04-03 NOTE — Progress Notes (Signed)
Physical Therapy Treatment Patient Details Name: Barry Miranda MRN: [id number] DOB: 18-Nov-1960 Today's Date: 04/03/2018    History of Present Illness  57 year old male with history of hyperlipidemia, presenting to the ED with headache.  CT showing large R SDH in much of the frontotemporoparietal regions.  Pt s/p crani and drain placement. Pt with increased lethargy on 6/19; repeat CT on 6/20 with 1.62mm midline shift increase. He additionally underwent decompressive right frontoparietal craniectomy and implantation of bone flap 03/28/18.     PT Comments    Continuing work on functional mobility and activity tolerance; , with noted excellent improvements in arousal, ability to participate, and activity tolerance; Still with cognitive deficits; Min assist with hallway and in room amb as he tends to collide with objects on L; Continue to recommend comprehensive inpatient rehab (CIR) for post-acute therapy needs.    Follow Up Recommendations  CIR     Equipment Recommendations  Other (comment)(TBA)    Recommendations for Other Services       Precautions / Restrictions Precautions Precautions: Fall;Other (comment) Precaution Comments: R craniectomy with bone flap removed    Mobility  Bed Mobility                  Transfers Overall transfer level: Needs assistance Equipment used: Rolling walker (2 wheeled) Transfers: Sit to/from Stand Sit to Stand: Min assist;+2 safety/equipment         General transfer comment: verbal cues for safe hand placement; pt able to rise into standing with steadying assist; verbal cues for hand placement on RW   Ambulation/Gait Ambulation/Gait assistance: Min assist Gait Distance (Feet): (In room and Hallway ambulation) Assistive device: Rolling walker (2 wheeled) Gait Pattern/deviations: Step-through pattern;Decreased stride length;Drifts right/left;Trunk flexed Gait velocity: decreased   General Gait Details: Noting tendency to run into  objects on the L, min assist for RW management and pathfinding; cues for upright posture; unable to walk and carry on a conversation at at the same time   Stairs             Wheelchair Mobility    Modified Rankin (Stroke Patients Only) Modified Rankin (Stroke Patients Only) Pre-Morbid Rankin Score: No symptoms Modified Rankin: Moderately severe disability     Balance     Sitting balance-Leahy Scale: Good       Standing balance-Leahy Scale: Poor Standing balance comment: reliant on UE support/external support at this time                             Cognition Arousal/Alertness: Awake/alert Behavior During Therapy: WFL for tasks assessed/performed Overall Cognitive Status: Impaired/Different from baseline Area of Impairment: Attention;Following commands;Problem solving                   Current Attention Level: Sustained   Following Commands: Follows one step commands with increased time     Problem Solving: Slow processing;Decreased initiation;Difficulty sequencing;Requires tactile cues;Requires verbal cues General Comments: More awake than previous session; Noting some difficulty with divided attention, has to stop walking to carry on a conversation      Exercises      General Comments        Pertinent Vitals/Pain Pain Assessment: Faces Faces Pain Scale: Hurts a little bit Pain Location: head Pain Descriptors / Indicators: Headache Pain Intervention(s): Monitored during session;Premedicated before session    Home Living  Prior Function            PT Goals (current goals can now be found in the care plan section) Acute Rehab PT Goals Patient Stated Goal: home PT Goal Formulation: With patient/family Time For Goal Achievement: 04/12/18 Potential to Achieve Goals: Good Progress towards PT goals: Progressing toward goals    Frequency    Min 4X/week      PT Plan Current plan remains  appropriate    Co-evaluation              AM-PAC PT "6 Clicks" Daily Activity  Outcome Measure  Difficulty turning over in bed (including adjusting bedclothes, sheets and blankets)?: A Little Difficulty moving from lying on back to sitting on the side of the bed? : A Lot Difficulty sitting down on and standing up from a chair with arms (e.g., wheelchair, bedside commode, etc,.)?: A Lot Help needed moving to and from a bed to chair (including a wheelchair)?: A Little Help needed walking in hospital room?: A Lot Help needed climbing 3-5 steps with a railing? : A Lot 6 Click Score: 14    End of Session Equipment Utilized During Treatment: Gait belt Activity Tolerance: Patient tolerated treatment well Patient left: in chair;with call bell/phone within reach;with chair alarm set Nurse Communication: Mobility status PT Visit Diagnosis: Other abnormalities of gait and mobility (R26.89);Unsteadiness on feet (R26.81);Other symptoms and signs involving the nervous system (R29.898) Pain - part of body: (head)     Time: 6168-3729 PT Time Calculation (min) (ACUTE ONLY): 19 min  Charges:  $Gait Training: 8-22 mins                    G Codes:       Roney Marion, [place] Pager [phone] Office [place] 04/03/2018, 1:47 PM

## 2018-04-04 ENCOUNTER — Encounter (HOSPITAL_COMMUNITY): Payer: Self-pay | Admitting: *Deleted

## 2018-04-04 ENCOUNTER — Inpatient Hospital Stay (HOSPITAL_COMMUNITY)
Admission: RE | Admit: 2018-04-04 | Discharge: 2018-04-13 | DRG: 057 | Disposition: A | Discharge status: Home / Self Care | Payer: 59 | Source: Intra-hospital | Attending: Physical Medicine & Rehabilitation | Admitting: Physical Medicine & Rehabilitation

## 2018-04-04 ENCOUNTER — Other Ambulatory Visit: Payer: Self-pay

## 2018-04-04 DIAGNOSIS — R0989 Other specified symptoms and signs involving the circulatory and respiratory systems: Secondary | ICD-10-CM | POA: Diagnosis not present

## 2018-04-04 DIAGNOSIS — G441 Vascular headache, not elsewhere classified: Secondary | ICD-10-CM | POA: Diagnosis not present

## 2018-04-04 DIAGNOSIS — Z8249 Family history of ischemic heart disease and other diseases of the circulatory system: Secondary | ICD-10-CM | POA: Diagnosis not present

## 2018-04-04 DIAGNOSIS — Z9049 Acquired absence of other specified parts of digestive tract: Secondary | ICD-10-CM

## 2018-04-04 DIAGNOSIS — M25552 Pain in left hip: Secondary | ICD-10-CM | POA: Diagnosis present

## 2018-04-04 DIAGNOSIS — F17210 Nicotine dependence, cigarettes, uncomplicated: Secondary | ICD-10-CM | POA: Diagnosis present

## 2018-04-04 DIAGNOSIS — G8918 Other acute postprocedural pain: Secondary | ICD-10-CM

## 2018-04-04 DIAGNOSIS — G479 Sleep disorder, unspecified: Secondary | ICD-10-CM | POA: Diagnosis not present

## 2018-04-04 DIAGNOSIS — E785 Hyperlipidemia, unspecified: Secondary | ICD-10-CM | POA: Diagnosis present

## 2018-04-04 DIAGNOSIS — R51 Headache: Secondary | ICD-10-CM | POA: Diagnosis not present

## 2018-04-04 DIAGNOSIS — Z79899 Other long term (current) drug therapy: Secondary | ICD-10-CM

## 2018-04-04 DIAGNOSIS — I10 Essential (primary) hypertension: Secondary | ICD-10-CM | POA: Diagnosis not present

## 2018-04-04 DIAGNOSIS — D62 Acute posthemorrhagic anemia: Secondary | ICD-10-CM | POA: Diagnosis not present

## 2018-04-04 DIAGNOSIS — D72829 Elevated white blood cell count, unspecified: Secondary | ICD-10-CM

## 2018-04-04 DIAGNOSIS — E46 Unspecified protein-calorie malnutrition: Secondary | ICD-10-CM

## 2018-04-04 DIAGNOSIS — K029 Dental caries, unspecified: Secondary | ICD-10-CM

## 2018-04-04 DIAGNOSIS — I69290 Apraxia following other nontraumatic intracranial hemorrhage: Secondary | ICD-10-CM | POA: Diagnosis not present

## 2018-04-04 DIAGNOSIS — K5900 Constipation, unspecified: Secondary | ICD-10-CM | POA: Diagnosis present

## 2018-04-04 DIAGNOSIS — D689 Coagulation defect, unspecified: Secondary | ICD-10-CM | POA: Diagnosis present

## 2018-04-04 DIAGNOSIS — I6200 Nontraumatic subdural hemorrhage, unspecified: Secondary | ICD-10-CM

## 2018-04-04 DIAGNOSIS — Z9889 Other specified postprocedural states: Secondary | ICD-10-CM

## 2018-04-04 DIAGNOSIS — Z7982 Long term (current) use of aspirin: Secondary | ICD-10-CM | POA: Diagnosis not present

## 2018-04-04 DIAGNOSIS — Z833 Family history of diabetes mellitus: Secondary | ICD-10-CM | POA: Diagnosis not present

## 2018-04-04 DIAGNOSIS — E876 Hypokalemia: Secondary | ICD-10-CM | POA: Diagnosis not present

## 2018-04-04 DIAGNOSIS — S065X9A Traumatic subdural hemorrhage with loss of consciousness of unspecified duration, initial encounter: Secondary | ICD-10-CM | POA: Diagnosis not present

## 2018-04-04 DIAGNOSIS — G8194 Hemiplegia, unspecified affecting left nondominant side: Secondary | ICD-10-CM | POA: Diagnosis not present

## 2018-04-04 DIAGNOSIS — S065XAA Traumatic subdural hemorrhage with loss of consciousness status unknown, initial encounter: Secondary | ICD-10-CM | POA: Diagnosis present

## 2018-04-04 DIAGNOSIS — Z23 Encounter for immunization: Secondary | ICD-10-CM | POA: Diagnosis not present

## 2018-04-04 DIAGNOSIS — R739 Hyperglycemia, unspecified: Secondary | ICD-10-CM

## 2018-04-04 DIAGNOSIS — I69254 Hemiplegia and hemiparesis following other nontraumatic intracranial hemorrhage affecting left non-dominant side: Principal | ICD-10-CM

## 2018-04-04 DIAGNOSIS — R41844 Frontal lobe and executive function deficit: Secondary | ICD-10-CM | POA: Diagnosis not present

## 2018-04-04 DIAGNOSIS — E8809 Other disorders of plasma-protein metabolism, not elsewhere classified: Secondary | ICD-10-CM | POA: Diagnosis not present

## 2018-04-04 LAB — SODIUM
SODIUM: 142 mmol/L (ref 135–145)
SODIUM: 142 mmol/L (ref 135–145)
SODIUM: 143 mmol/L (ref 135–145)

## 2018-04-04 MED ORDER — FAMOTIDINE 20 MG PO TABS
20.0000 mg | ORAL_TABLET | Freq: Two times a day (BID) | ORAL | Status: DC
  Administered 2018-04-04 – 2018-04-13 (×18): 20 mg via ORAL
  Filled 2018-04-04 (×18): qty 1

## 2018-04-04 MED ORDER — ONDANSETRON HCL 4 MG/2ML IJ SOLN: 4.0000 mg | INTRAMUSCULAR | Status: DC | PRN

## 2018-04-04 MED ORDER — LEVETIRACETAM 1000 MG PO TABS: 1000.0000 mg | ORAL_TABLET | Freq: Two times a day (BID) | ORAL | 2 refills | Status: DC

## 2018-04-04 MED ORDER — BUTALBITAL-APAP-CAFFEINE 50-325-40 MG PO TABS
1.0000 | ORAL_TABLET | ORAL | Status: DC | PRN
  Administered 2018-04-05 – 2018-04-13 (×23): 1 via ORAL
  Filled 2018-04-04 (×23): qty 1

## 2018-04-04 MED ORDER — DOCUSATE SODIUM 50 MG/5ML PO LIQD
100.0000 mg | Freq: Two times a day (BID) | ORAL | Status: DC
  Administered 2018-04-04 – 2018-04-13 (×17): 100 mg via ORAL
  Filled 2018-04-04 (×18): qty 10

## 2018-04-04 MED ORDER — HYDROCODONE-ACETAMINOPHEN 5-325 MG PO TABS: 1.0000 | ORAL_TABLET | ORAL | Status: DC | PRN

## 2018-04-04 MED ORDER — PNEUMOCOCCAL VAC POLYVALENT 25 MCG/0.5ML IJ INJ
0.5000 mL | INJECTION | INTRAMUSCULAR | Status: AC
  Administered 2018-04-05: 0.5 mL via INTRAMUSCULAR
  Filled 2018-04-04: qty 0.5

## 2018-04-04 MED ORDER — ACETAMINOPHEN 325 MG PO TABS
650.0000 mg | ORAL_TABLET | ORAL | Status: DC | PRN
  Administered 2018-04-04 – 2018-04-13 (×15): 650 mg via ORAL
  Filled 2018-04-04 (×16): qty 2

## 2018-04-04 MED ORDER — POLYETHYLENE GLYCOL 3350 17 G PO PACK
17.0000 g | PACK | Freq: Every day | ORAL | Status: DC | PRN
  Administered 2018-04-05: 17 g via ORAL
  Filled 2018-04-04: qty 1

## 2018-04-04 MED ORDER — VITAMIN D 1000 UNITS PO TABS
2000.0000 [IU] | ORAL_TABLET | Freq: Every day | ORAL | Status: DC
  Administered 2018-04-05 – 2018-04-13 (×9): 2000 [IU] via ORAL
  Filled 2018-04-04 (×9): qty 2

## 2018-04-04 MED ORDER — BISACODYL 10 MG RE SUPP: 10.0000 mg | Freq: Every day | RECTAL | Status: DC | PRN

## 2018-04-04 MED ORDER — LEVETIRACETAM 500 MG PO TABS
1000.0000 mg | ORAL_TABLET | Freq: Two times a day (BID) | ORAL | Status: DC
  Administered 2018-04-04 – 2018-04-13 (×18): 1000 mg via ORAL
  Filled 2018-04-04 (×18): qty 2

## 2018-04-04 MED ORDER — ACETAMINOPHEN 325 MG PO TABS: 650.0000 mg | ORAL_TABLET | ORAL | Status: DC | PRN

## 2018-04-04 MED ORDER — ONDANSETRON HCL 4 MG PO TABS: 4.0000 mg | ORAL_TABLET | ORAL | Status: DC | PRN

## 2018-04-04 MED ORDER — ACETAMINOPHEN 650 MG RE SUPP: 650.0000 mg | RECTAL | Status: DC | PRN

## 2018-04-04 NOTE — Progress Notes (Signed)
Physical Medicine and Rehabilitation Consult Reason for Consult: Decreased functional mobility Referring Physician: Dr. Vertell Limber   HPI: Barry Miranda is a 57 y.o. right-handed male with history of hyperlipidemia and tobacco abuse.  Per chart review and wife, patient independent prior to admission living with spouse.  He is an Conservation officer, nature.  Two-level home with bed and bath upstairs.  Presented 03/24/2018 with headache times several days with associated nausea and vomiting as well as photophobia.  Denied any head trauma or falls.  Cranial CT reviewed, showing right SDH.  Per report, acute 1 cm right holohemispheric subdural hematoma resulting in a 1 cm right to left subfalcine herniation with left lateral ventricle entrapment.  Underwent craniotomy evacuation of hematoma 03/24/2018 per Dr. Vertell Limber.  Postoperative lethargy with follow-up CT performed demonstrating presence of acute epidural hematoma in the region of previous subdural hematoma and underwent right frontal temporal craniotomy evacuation of epidural hematoma 03/25/2018 per Dr. Ellene Route.  Patient remained intubated for a short time.  Patient did well after return to the OR however within 36 hours progressive decline somewhat waxing and waning course.  Repeat scan demonstrated continued approximately 12 mm right subdural hematoma and 11 mm midline shift.  There was also effacement of the basal cisterns.  Patient returned again to the OR underwent decompressive right frontal parietal craniectomy implantation of bone flap and subcutaneous pocket 03/28/2018 per Dr. Kathyrn Sheriff.  Maintained on Keppra for seizure prophylaxis with EEG completed 03/28/2018 consistent with generalized and focal left cerebral dysfunction suggesting underlying left hemispheric structural defect no seizure activity noted patient's Keppra was adjusted.  Patient was ultimately extubated 03/29/2018.  Currently on a full liquid diet.  Physical and occupational therapy evaluations completed  with recommendations of physical medicine rehab consult.   Review of Systems  Unable to perform ROS: Mental acuity       Past Medical History:  Diagnosis Date  . Hyperlipidemia         Past Surgical History:  Procedure Laterality Date  . CHOLECYSTECTOMY  2012  . CRANIECTOMY FOR DEPRESSED SKULL FRACTURE Right 03/28/2018   Procedure: CRANIECTOMY for cerebral edema with Placement of Skull Flap in Abdomen;  Surgeon: Consuella Lose, MD;  Location: [facility];  Service: Neurosurgery;  Laterality: Right;  . CRANIOTOMY Right 03/24/2018   Procedure: CRANIOTOMY, HEMATOMA EVACUATION SUBDURAL, RIGHT SIDE;  Surgeon: Erline Levine, MD;  Location: [facility];  Service: Neurosurgery;  Laterality: Right;  . CRANIOTOMY Right 03/24/2018   Procedure: CRANIOTOMY HEMATOMA EVACUATION EPIDURAL;  Surgeon: Kristeen Miss, MD;  Location: [facility];  Service: Neurosurgery;  Laterality: Right;  . LIPOMA EXCISION     lipoma on forehead   . WISDOM TOOTH EXTRACTION          Family History  Problem Relation Age of Onset  . Hypertension Mother   . Diabetes Mother    Social History:  reports that he has been smoking.  He has been smoking about 0.15 packs per day. He has never used smokeless tobacco. He reports that he drinks about 4.2 oz of alcohol per week. He reports that he does not use drugs. Allergies: No Known Allergies       Medications Prior to Admission  Medication Sig Dispense Refill  . aspirin EC 81 MG tablet Take 81 mg by mouth daily.    . Cholecalciferol (VITAMIN D) 2000 units CAPS Take 2,000 Units by mouth daily.     . simvastatin (ZOCOR) 20 MG tablet Take 20 mg by mouth every evening.  Home: Home Living Family/patient expects to be discharged to:: Private residence Living Arrangements: Spouse/significant other, Children Available Help at Discharge: Family, Available 24 hours/day Type of Home: House Home Access: Level entry Home Layout: Two level, Bed/bath  upstairs Alternate Level Stairs-Number of Steps: flight Alternate Level Stairs-Rails: Right, Left Bathroom Shower/Tub: Multimedia programmer: Standard Home Equipment: None  Functional History: Prior Function Level of Independence: Independent Comments: Psychologist, counselling Status:  Mobility: Bed Mobility Overal bed mobility: Needs Assistance Bed Mobility: Supine to Sit Supine to sit: Mod assist Sit to supine: Min assist General bed mobility comments: cues for sequencing, assist with BLE and to elevate trunk Transfers Overall transfer level: Needs assistance Equipment used: 2 person hand held assist Transfers: Sit to/from Stand Sit to Stand: Min assist General transfer comment: cues for sequencing Ambulation/Gait Ambulation/Gait assistance: Min assist Gait Distance (Feet): 2 Feet Assistive device: 2 person hand held assist Gait Pattern/deviations: Step-through pattern, Decreased stride length General Gait Details: Side step to left to [facility].  Nursing did not want pt OOB.  BP was elevated and incr with sitting up therefore sat EOB, stood briefly and then laid pt back down.  Gait velocity: decreased Gait velocity interpretation: <1.31 ft/sec, indicative of household ambulator Stairs: Yes Stairs assistance: Supervision Stair Management: No rails, Alternating pattern, Backwards, Forwards Number of Stairs: 4 General stair comments: steady without rail, but guarded.  ADL: ADL Overall ADL's : Needs assistance/impaired Eating/Feeding: Minimal assistance, Sitting Eating/Feeding Details (indicate cue type and reason): for ice chips and suction, cues for initiation Grooming: Wash/dry hands, Wash/dry face, Oral care, Minimal assistance, Standing Upper Body Bathing: Minimal assistance, Sitting Lower Body Bathing: Moderate assistance, Sit to/from stand Lower Body Bathing Details (indicate cue type and reason): limited by HA  Upper Body Dressing : Minimal assistance,  Sitting Lower Body Dressing: Maximal assistance, Sit to/from stand Lower Body Dressing Details (indicate cue type and reason): to don socks Toilet Transfer: Minimal assistance, +2 for physical assistance, Stand-pivot Toilet Transfer Details (indicate cue type and reason): simulated by stand pivot EOB to chair. Pt able to stand with min assist to void in condom cath Toileting- Clothing Manipulation and Hygiene: Minimal assistance, Sit to/from stand Functional mobility during ADLs: Minimal assistance, +2 for physical assistance(HHA) General ADL Comments: Pt with increased lethargy today and severe headache and reporting nausea. RN presenting and assisting--gave pain and nausea medicine. Session limited due to pain and medical complications. VSS throughout.  Cognition: Cognition Overall Cognitive Status: Impaired/Different from baseline Orientation Level: Oriented to person, Oriented to place, Oriented to time, Oriented to situation Cognition Arousal/Alertness: Lethargic Behavior During Therapy: Flat affect Overall Cognitive Status: Impaired/Different from baseline Area of Impairment: Attention, Following commands, Problem solving Current Attention Level: Sustained Following Commands: Follows one step commands with increased time Problem Solving: Slow processing, Decreased initiation, Requires verbal cues General Comments: Minimally verbal, keeping eyes closed.   Blood pressure (!) 141/77, pulse 74, temperature 98.2 F (36.8 C), temperature source Axillary, resp. rate 14, height 5\' 4"  (1.626 m), weight 69.5 kg (153 lb 3.5 oz), SpO2 99 %. Physical Exam  Vitals reviewed. Constitutional: He appears well-developed and well-nourished.  HENT:  Craniotomy site dressed  Eyes: Right eye exhibits no discharge. Left eye exhibits no discharge.  Pupils sluggish but reactive to light  Neck: Normal range of motion. Neck supple. No thyromegaly present.  Cardiovascular: Normal rate and regular rhythm.   Respiratory: Effort normal and breath sounds normal. No respiratory distress.  GI: Soft. Bowel sounds are normal. He  exhibits no distension.  Musculoskeletal:  No edema or tenderness in extremities  Neurological:  Very lethargic, briefly follows some commands Appears to have apraxia Motor: (limited by lethargy): ?>/4/5 throughout  Skin: Skin is warm and dry.  Staples in place c/d/i  Psychiatric:  Unable to assess due to lethargy    LabResultsLast24Hours       Results for orders placed or performed during the hospital encounter of 03/23/18 (from the past 24 hour(s))  Sodium     Status: Abnormal   Collection Time: 03/31/18  5:42 AM  Result Value Ref Range   Sodium 148 (H) 135 - 145 mmol/L  Sodium     Status: Abnormal   Collection Time: 03/31/18 12:32 PM  Result Value Ref Range   Sodium 149 (H) 135 - 145 mmol/L  Sodium     Status: None   Collection Time: 03/31/18  6:11 PM  Result Value Ref Range   Sodium 144 135 - 145 mmol/L  Sodium     Status: Abnormal   Collection Time: 03/31/18 11:36 PM  Result Value Ref Range   Sodium 147 (H) 135 - 145 mmol/L     ImagingResults(Last48hours)  No results found.    Assessment/Plan: Diagnosis: Right SDH/EDH Labs and images independently reviewed.  Records reviewed and summated above.  1. Does the need for close, 24 hr/day medical supervision in concert with the patient's rehab needs make it unreasonable for this patient to be served in a less intensive setting? Yes  2. Co-Morbidities requiring supervision/potential complications: hyperlipidemia (cont meds), tobacco abuse (counsel), Tachycardia (monitor in accordance with pain and increasing activity), HTN (monitor and provide prns in accordance with increased physical exertion and pain), hypernatremia (cont to monitor, wean 3% Nacl as appropriate), ABLA (transfuse if necessary to ensure appropriate perfusion for increased activity tolerance) 3. Due to bladder  management, safety, skin/wound care, disease management, medication administration, pain management and patient education, does the patient require 24 hr/day rehab nursing? Yes 4. Does the patient require coordinated care of a physician, rehab nurse, PT (1-2 hrs/day, 5 days/week), OT (1-2 hrs/day, 5 days/week) and SLP (1-2 hrs/day, 5 days/week) to address physical and functional deficits in the context of the above medical diagnosis(es)? Yes Addressing deficits in the following areas: balance, endurance, locomotion, strength, transferring, bathing, dressing, toileting, cognition, speech and psychosocial support 5. Can the patient actively participate in an intensive therapy program of at least 3 hrs of therapy per day at least 5 days per week? Potentially 6. The potential for patient to make measurable gains while on inpatient rehab is excellent 7. Anticipated functional outcomes upon discharge from inpatient rehab are supervision and min assist  with PT, supervision and min assist with OT, modified independent and supervision with SLP. 8. Estimated rehab length of stay to reach the above functional goals is: 12-16 days. 9. Anticipated D/C setting: Home 10. Anticipated post D/C treatments: HH therapy and Home excercise program 11. Overall Rehab/Functional Prognosis: good  RECOMMENDATIONS: This patient's condition is appropriate for continued rehabilitative care in the following setting: Potentially CIR.  Will monitor functional progress as pt becomes medically stable and ready for discharge.  Patient has agreed to participate in recommended program. Potentially Note that insurance prior authorization may be required for reimbursement for recommended care.  Comment: Rehab Admissions Coordinator to follow up.   I have personally performed a face to face diagnostic evaluation, including, but not limited to relevant history and physical exam findings, of this patient and developed relevant  assessment  and plan.  Additionally, I have reviewed and concur with the physician assistant's documentation above.   Delice Lesch, MD, ABPMR Lavon Paganini Angiulli, PA-C 04/01/2018          Revision History                        Routing History

## 2018-04-04 NOTE — Progress Notes (Signed)
Orthopedic Tech Progress Note Patient Details:  Barry Miranda [birth year] [id number]  Patient ID: Barry Miranda, male   DOB: [birth year], 57 y.o.   MRN: [id number]   Barry Miranda 04/04/2018, 6:26 PM Called in hanger brace order; spoke with answering service

## 2018-04-04 NOTE — Progress Notes (Signed)
PMR Admission Coordinator Pre-Admission Assessment   Patient: Barry Miranda is an 57 y.o., male MRN: [id number] DOB: 01-21-1961 Height: 5\' 4"  (162.6 cm) Weight: 69.5 kg (153 lb 3.5 oz)                                                                                                                                                   Insurance Information HMO:     PPO: Yes     PCP:      IPA:      80/20:      OTHER:  PRIMARY: [employer] ([employer])      Policy#: [id number]      Subscriber: Meridee Score CM Name: Vevelyn Royals       Phone#: [phone]     Fax#: [phone]  Updates due to Vevelyn Royals on day 7 of inpatient rehab (Pt admitted 04/04/18 with update due 04/11/18).  Pre-Cert#: [id number]      Employer:  Benefits:  Phone #: [phone]     Name: [email or web address] Eff. Date: 10/09/17     Deduct: $1000  Out of Pocket Max: $5,500      Life Max: NA CIR: $300/admisssion then 70% after deductible      SNF: 70%/30% after deductible with 90 day limit Outpatient: (60 combined PT/OT/SLP visit limit) 70% after deductible   Co-Pay: 30% after deductible   Home Health: (60 visit limit with 1 visit=4 hours)70% after deductible      Co-Pay: 30% after deductible  DME: 70% after deductible     Co-Pay: 30% after deductible Providers:   Medicaid Application Date:       Case Manager:  Disability Application Date:       Case Worker:   Emergency Contact Information Contact Information    Name Relation Home Work Sims Spouse [phone]  [phone]     Current Medical History  Patient Admitting Diagnosis: Right SDH/EDH History of Present Illness: Barry Nguyenis a 57 year old right-handed male with history of hyperlipidemia and tobacco abuse. Per chart review and wife patient independent prior to admission living with spouse. He is an Conservation officer, nature. Two-level home with bed and bath upstairs. Presented 03/24/2018 with headache times several days with associated nausea and  vomiting as well as photophobia. Denied any trauma or falls. Cranial CT scan reviewed, showing right SDH. Per report, acute 1 cm right holohemispheric subdural hematoma resulting in a 1 cm right to left subfalcine herniation with left lateral ventricle entrapment. Underwent craniotomy evacuation of hematoma 03/24/2018 per Dr. Vertell Limber. Postoperative lethargy with follow-up CT demonstrating presence of acute epidural hematoma in the region of previous subdural hematoma and underwent right frontal temporal craniotomy evacuation 03/25/2018 per Dr. Ellene Route. Patient remained intubated for a short time. Patient did well until approximately 36 hours after latest surgery with progressive decline somewhat waxing and waning  course. Repeat scan demonstrated continued approximately 12 mm right subdural hematoma and 11 mm midline shift. There was also effacement of the basal cisterns. Patient again returned to the OR underwent decompressive right frontal parietal craniectomy implantation of bone flap and subcutaneous pocket 03/28/2018 per Dr. Kathyrn Sheriff. Maintained on Keppra for seizure prophylaxis with EEG completed 03/28/2018 consistent with generalized and focal left cerebral dysfunction no seizure activity .He was ultimately extubated 03/29/2018.Patient had remained on 3% normal saline through 04/03/2018 and stopped. Physical and occupational therapy evaluations completed and ongoing with recommendations of physical medicine rehab consult. Patient is to be admitted for a comprehensive rehab program on 04/04/18.  Past Medical History      Past Medical History:  Diagnosis Date  . Hyperlipidemia     Family History  family history includes Diabetes in his mother; Hypertension in his mother.  Prior Rehab/Hospitalizations:  Has the patient had major surgery during 100 days prior to admission? No  Current Medications   Current Facility-Administered Medications:  .  0.9 %  sodium chloride infusion, ,  Intravenous, Q2H PRN, Erline Levine, MD, Stopped at 03/31/18 0004 .  0.9 %  sodium chloride infusion, , Intravenous, Continuous, Costella, Vista Mink, PA-C, Stopped at 04/02/18 2313 .  acetaminophen (TYLENOL) tablet 650 mg, 650 mg, Oral, Q4H PRN, 650 mg at 04/04/18 0901 **OR** acetaminophen (TYLENOL) suppository 650 mg, 650 mg, Rectal, Q4H PRN, Costella, Vincent J, PA-C .  acetaminophen (TYLENOL) tablet 650 mg, 650 mg, Oral, Q4H PRN, Erline Levine, MD, 650 mg at 04/03/18 1230 .  bisacodyl (DULCOLAX) suppository 10 mg, 10 mg, Rectal, Daily PRN, Kristeen Miss, MD .  butalbital-acetaminophen-caffeine (FIORICET, ESGIC) 50-325-40 MG per tablet 1 tablet, 1 tablet, Oral, Q4H PRN, Costella, Vista Mink, PA-C, 1 tablet at 04/04/18 1222 .  cholecalciferol (VITAMIN D) tablet 2,000 Units, 2,000 Units, Oral, Daily, Kristeen Miss, MD, 2,000 Units at 04/04/18 0931 .  docusate (COLACE) 50 MG/5ML liquid 100 mg, 100 mg, Oral, BID, Erline Levine, MD, 100 mg at 04/04/18 0934 .  famotidine (PEPCID) tablet 20 mg, 20 mg, Oral, BID, Erline Levine, MD, 20 mg at 04/04/18 0933 .  fentaNYL (SUBLIMAZE) injection 25-100 mcg, 25-100 mcg, Intravenous, Q2H PRN, Costella, Vincent J, PA-C, 50 mcg at 04/01/18 0411 .  hydrALAZINE (APRESOLINE) injection 10-20 mg, 10-20 mg, Intravenous, Q6H PRN, Meyran, Ocie Cornfield, NP, 20 mg at 04/03/18 1946 .  HYDROcodone-acetaminophen (NORCO/VICODIN) 5-325 MG per tablet 1 tablet, 1 tablet, Oral, Q4H PRN, Costella, Vista Mink, PA-C .  HYDROmorphone (DILAUDID) injection 1 mg, 1 mg, Intravenous, Q3H PRN, Meyran, Ocie Cornfield, NP, 1 mg at 03/31/18 1136 .  labetalol (NORMODYNE,TRANDATE) injection 10-40 mg, 10-40 mg, Intravenous, Q10 min PRN, Kristeen Miss, MD, 10 mg at 04/02/18 0944 .  labetalol (NORMODYNE,TRANDATE) injection 10-40 mg, 10-40 mg, Intravenous, Q10 min PRN, Costella, Vincent J, PA-C, 30 mg at 04/02/18 2355 .  levETIRAcetam (KEPPRA) tablet 1,000 mg, 1,000 mg, Oral, BID, Costella, Vincent J,  PA-C, 1,000 mg at 04/04/18 0931 .  naloxone (NARCAN) injection 0.08 mg, 0.08 mg, Intravenous, PRN, Costella, Vincent J, PA-C .  nicardipine (CARDENE) 40mg  in 0.83% saline 241ml IV DOUBLE STRENGTH infusion (0.2 mg/ml), 3-15 mg/hr, Intravenous, Continuous, Erline Levine, MD, Stopped at 04/02/18 0703 .  ondansetron (ZOFRAN) tablet 4 mg, 4 mg, Oral, Q4H PRN, 4 mg at 03/26/18 2026 **OR** ondansetron (ZOFRAN) injection 4 mg, 4 mg, Intravenous, Q4H PRN, Kristeen Miss, MD, 4 mg at 03/28/18 1645 .  ondansetron (ZOFRAN) tablet 4 mg, 4 mg, Oral, Q4H PRN **OR** ondansetron (  ZOFRAN) injection 4 mg, 4 mg, Intravenous, Q4H PRN, Costella, Vincent J, PA-C .  polyethylene glycol (MIRALAX / GLYCOLAX) packet 17 g, 17 g, Oral, Daily PRN, Kristeen Miss, MD, 17 g at 04/02/18 0902 .  promethazine (PHENERGAN) tablet 12.5-25 mg, 12.5-25 mg, Oral, Q4H PRN, Kristeen Miss, MD .  promethazine (PHENERGAN) tablet 12.5-25 mg, 12.5-25 mg, Oral, Q4H PRN, Costella, Vista Mink, PA-C .  senna (SENOKOT) tablet 8.6 mg, 1 tablet, Oral, BID, Kristeen Miss, MD, 8.6 mg at 04/04/18 0932 .  simvastatin (ZOCOR) tablet 20 mg, 20 mg, Per Tube, QPM, Desai, Rahul P, PA-C, 20 mg at 04/03/18 1845 .  sodium phosphate (FLEET) 7-19 GM/118ML enema 1 enema, 1 enema, Rectal, Once PRN, Kristeen Miss, MD  Patients Current Diet:       Diet Order           Diet regular Room service appropriate? 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 None  Prior Functional Level Prior Function Level of Independence: Independent Comments: Conservation officer, nature  Self Care: Did the patient need help bathing, dressing, using the toilet or eating?  Independent  Indoor Mobility: Did the patient need assistance with walking from room to room (with or without device)? Independent  Stairs: Did the patient need assistance with internal or external stairs (with or without device)? Independent  Functional Cognition: Did the patient need help planning regular tasks such as shopping or remembering to take medications? Independent  Current Functional Level Cognition  Overall Cognitive Status: Impaired/Different from baseline Current Attention Level: Sustained Orientation Level: Oriented X4 Following Commands: Follows one step commands with increased time General Comments: More awake than previous session; Noting some difficulty with divided attention, has to stop walking to carry on a conversation    Extremity Assessment (includes Sensation/Coordination)  Upper Extremity Assessment: Generalized weakness  Lower Extremity Assessment: Defer to PT evaluation    ADLs  Overall ADL's : Needs assistance/impaired Eating/Feeding: Supervision/ safety, Sitting Eating/Feeding Details (indicate cue type and reason): pt self-feeding with supervision of wife start and end of session Grooming: Wash/dry face, Set up, Sitting Grooming Details (indicate cue type and reason): using RUE to complete  Upper Body Bathing: Minimal assistance, Sitting Lower Body Bathing: Moderate assistance, Sit to/from stand Lower Body Bathing Details (indicate cue type and reason): limited by HA  Upper Body Dressing : Minimal assistance, Sitting Lower Body Dressing: Minimal assistance, Sit to/from stand Lower Body Dressing Details (indicate cue type and reason): pt donning socks seated EOB; intermittent minA for  dynamic sitting balance; increased time and effort to don L sock, verbal cues to use both hands to assist; minA for sit<>stand  Toilet Transfer: Minimal assistance, +2 for physical assistance, Stand-pivot Toilet Transfer Details (indicate cue type and reason): simulated by stand pivot EOB to chair. Pt able to stand with min assist to void in condom cath Toileting- Clothing Manipulation and Hygiene: Minimal assistance, Sit to/from stand Functional mobility during ADLs: Minimal assistance, +2 for  safety/equipment, Rolling walker General ADL Comments: pt much more awake/alert this session; completing bed mobility and transfer to recliner to finish breakfast during session    Mobility  Overal bed mobility: Needs Assistance Bed Mobility: Supine to Sit Supine to sit: Min guard Sit to supine: Mod assist, +2 for physical assistance General bed mobility comments: minguard for safety with HOB elevated; requires additional cues for safety as pt attempting to move towards EOB prior to bedrail being lowered and lines in place    Transfers  Overall transfer level: Needs assistance Equipment used: Rolling walker (2 wheeled) Transfers: Sit to/from Stand Sit to Stand: Min assist, +2 safety/equipment Stand pivot transfers: Min assist, +2 safety/equipment General transfer comment: verbal cues for safe hand placement; pt able to rise into standing with steadying assist; verbal cues for hand placement on RW     Ambulation / Gait / Stairs / Wheelchair Mobility  Ambulation/Gait Ambulation/Gait assistance: Herbalist (Feet): (In room and Hallway ambulation) Assistive device: Rolling walker (2 wheeled) Gait Pattern/deviations: Step-through pattern, Decreased stride length, Drifts right/left, Trunk flexed General Gait Details: Noting tendency to run into objects on the L, min assist for RW management and pathfinding; cues for upright posture; unable to walk and carry on a conversation at at the  same time Gait velocity: decreased Gait velocity interpretation: <1.31 ft/sec, indicative of household ambulator Stairs: Yes Stairs assistance: Supervision Stair Management: No rails, Alternating pattern, Backwards, Forwards Number of Stairs: 4 General stair comments: steady without rail, but guarded.    Posture / Balance Dynamic Sitting Balance Sitting balance - Comments: intermittent minA for dynamic balance; minguard for static sitting  Balance Overall balance assessment: Needs assistance Sitting-balance support: Feet supported Sitting balance-Leahy Scale: Good Sitting balance - Comments: intermittent minA for dynamic balance; minguard for static sitting  Standing balance support: Bilateral upper extremity supported, During functional activity Standing balance-Leahy Scale: Poor Standing balance comment: reliant on UE support/external support at this time     Special needs/care consideration BiPAP/CPAP: No CPM: No Continuous Drip IV: No Dialysis: No        Days: No Life Vest: No Oxygen: No Special Bed: No Trach Size: No Wound Vac (area): No      LocationNA Skin: Surgical incision to Right abdomen, surgical incision to Right head               Bowel mgmt:04/02/18 Bladder mgmt: Catheter in place Diabetic mgmt: NA     Previous Home Environment Living Arrangements: Spouse/significant other, Children Available Help at Discharge: Family, Available 24 hours/day Type of Home: House Home Layout: Two level, Bed/bath upstairs Alternate Level Stairs-Rails: Right, Left Alternate Level Stairs-Number of Steps: flight Home Access: Level entry Bathroom Shower/Tub: Multimedia programmer: Standard Home Care Services: No  Discharge Living Setting Plans for Discharge Living Setting: Patient's home, Lives with (comment)(lives with wife and two daugthers) Type of Home at Discharge: House Discharge Home Layout: Two level, 1/2 bath on main level Alternate Level  Stairs-Rails: None(unknown) Alternate Level Stairs-Number of Steps: unknown  Discharge Home Access: Level entry Discharge Bathroom Shower/Tub: Tub/shower unit, Walk-in shower Discharge Bathroom Toilet: Standard Discharge Bathroom Accessibility: Yes How Accessible: Accessible via walker Does the patient have any problems obtaining your medications?: No  Social/Family/Support Systems Patient Roles: Spouse, Parent Contact Information: wife is to be emergency contact Anticipated Caregiver: wife: [name] Anticipated Ambulance person Information: wife (home) [phone]; (cell) [phone] Ability/Limitations of Caregiver: wife and two daugthers can assist for 24/7 assist Caregiver Availability: 24/7 Discharge  Plan Discussed with Primary Caregiver: Yes Is Caregiver In Agreement with Plan?: Yes Does Caregiver/Family have Issues with Lodging/Transportation while Pt is in Rehab?: No   Goals/Additional Needs Patient/Family Goal for Rehab: PT/OT/SLP: Supervision/Min A Expected length of stay: 12-16 days Cultural Considerations: Buddhist  Dietary Needs: Regular, Thin liquids Equipment Needs: TBD Special Service Needs: Pt has craniectomy flap, with the bone flap implanted in abdomen (may need helmet or protected head gear for safety during therapy). Additional Information: NA Pt/Family Agrees to Admission and willing to participate: Yes Program Orientation Provided & Reviewed with Pt/Caregiver Including Roles  & Responsibilities: Yes(wife) Additional Information Needs: NA Information Needs to be Provided By: NA  Barriers to Discharge: Home environment access/layout   Decrease burden of Care through IP rehab admission: NA  Possible need for SNF placement upon discharge: Not anticipated   Patient Condition: This patient's medical and functional status has changed since the consult dated: 04/01/18 in which the Rehabilitation Physician determined and documented that the patient's  condition is appropriate for intensive rehabilitative care in an inpatient rehabilitation facility. See "History of Present Illness" (above) for medical update. Functional changes are: Min A x2 sit to stand and Min A for ambulation in hallway. Patient's medical and functional status update has been discussed with the Rehabilitation physician and patient remains appropriate for inpatient rehabilitation. Will admit to inpatient rehab today.  Preadmission Screen Completed By:  Jhonnie Garner, 04/04/2018 2:46 PM ______________________________________________________________________   Discussed status with Dr. Naaman Plummer on 04/04/18 at 2:45 PM and received telephone approval for admission today.  Admission Coordinator:  Jhonnie Garner, time 2:45PM/Date 04/04/18.             Cosigned by: Meredith Staggers, MD at 04/04/2018 3:00 PM  Revision History

## 2018-04-04 NOTE — Progress Notes (Signed)
To Rehab alert and oriented patient s/p crani with bone flap to abdomen per wheelchair accompanied by RN, NT and family members. Oriented to unit set up. Fall precaution bundle discussed and signed by patient.

## 2018-04-04 NOTE — Progress Notes (Addendum)
Subjective: Patient reports "My head hurts, especially when I cough...but I knew I needed a root canal before this, so that is still causing pain"  Objective: Vital signs in last 24 hours: Temp:  [97.8 F (36.6 C)-98.6 F (37 C)] 98.6 F (37 C) (06/27 0730) Pulse Rate:  [65-93] 81 (06/27 0700) Resp:  [13-22] 16 (06/27 0700) BP: (97-165)/(52-87) 130/79 (06/27 0700) SpO2:  [96 %-100 %] 98 % (06/27 0700)  Intake/Output from previous day: 06/26 0701 - 06/27 0700 In: 1356.9 [P.O.:960; I.V.:396.9] Out: 2950 [Urine:2950] Intake/Output this shift: No intake/output data recorded.  Alert, conversant. Wife present. MAEW. No drift. Right scalp and right abdominal incisions without erythema, swelling, or drainage.   Lab Results: No results for input(s): WBC, HGB, HCT, PLT in the last 72 hours. BMET Recent Labs    04/03/18 2349 04/04/18 0615  NA 142 142    Studies/Results: No results found.  Assessment/Plan: improving  LOS: 11 days  Hopeful for CIR   P[place]at, Aaron Edelman 04/04/2018, 10:17 AM  Mobilize.  Rehab when bed is available.

## 2018-04-04 NOTE — Progress Notes (Signed)
Inpatient Rehabilitation-Admissions Coordinator   [facility] has received insurance authorization and medical approval for admit to CIR today. AC has updated floor RN, CM, and SW on plan for admit today. Please call if questions.   Jhonnie Garner, OTR/L  Rehab Admissions Coordinator  [phone] 04/04/2018 3:02 PM

## 2018-04-04 NOTE — Care Management Note (Signed)
Case Management Note  Patient Details  Name: Barry Miranda MRN: [id number] Date of Birth: 1961/09/28  Subjective/Objective:  58 year old male with history of hyperlipidemia, presenting to the ED with headache.  CT showing large R SDH in much of the frontotemporoparietal regions.  Pt s/p crani and drain placement.  PTA, pt independent, lives with spouse.                   Action/Plan: PT/OT recommending CIR, and consult in process.   Expected Discharge Date:  04/04/18               Expected Discharge Plan:  [place]  In-House Referral:     Discharge planning Services  CM Consult  Post Acute Care Choice:    Choice offered to:     DME Arranged:    DME Agency:     HH Arranged:    HH Agency:     Status of Service:  Completed, signed off  If discussed at [employer], dates discussed:    Additional Comments:  04/04/18 J. Syniah Berne, Therapist, sports, BSN Pt medically stable for discharge and insurance authorization received for rehab admission.    Ella Bodo, RN 04/04/2018, 4:11 PM

## 2018-04-05 ENCOUNTER — Inpatient Hospital Stay (HOSPITAL_COMMUNITY): Payer: 59 | Admitting: Speech Pathology

## 2018-04-05 ENCOUNTER — Inpatient Hospital Stay (HOSPITAL_COMMUNITY): Payer: 59

## 2018-04-05 ENCOUNTER — Inpatient Hospital Stay (HOSPITAL_COMMUNITY): Payer: 59 | Admitting: Physical Therapy

## 2018-04-05 DIAGNOSIS — R51 Headache: Secondary | ICD-10-CM

## 2018-04-05 LAB — COMPREHENSIVE METABOLIC PANEL
ALT: 55 U/L — ABNORMAL HIGH (ref 0–44)
ANION GAP: 7 (ref 5–15)
AST: 26 U/L (ref 15–41)
Albumin: 3 g/dL — ABNORMAL LOW (ref 3.5–5.0)
Alkaline Phosphatase: 44 U/L (ref 38–126)
BILIRUBIN TOTAL: 0.5 mg/dL (ref 0.3–1.2)
BUN: 7 mg/dL (ref 6–20)
CALCIUM: 8.1 mg/dL — AB (ref 8.9–10.3)
CO2: 30 mmol/L (ref 22–32)
Chloride: 105 mmol/L (ref 98–111)
Creatinine, Ser: 0.6 mg/dL — ABNORMAL LOW (ref 0.61–1.24)
GFR calc non Af Amer: >60 mL/min (ref >60)
Glucose, Bld: 106 mg/dL — ABNORMAL HIGH (ref 70–99)
Potassium: 3.2 mmol/L — ABNORMAL LOW (ref 3.5–5.1)
Sodium: 142 mmol/L (ref 135–145)
TOTAL PROTEIN: 5.3 g/dL — AB (ref 6.5–8.1)

## 2018-04-05 LAB — CBC WITH DIFFERENTIAL/PLATELET
Abs Immature Granulocytes: 0.1 10*3/uL (ref 0.0–0.1)
BASOS ABS: 0 10*3/uL (ref 0.0–0.1)
Basophils Relative: 0 %
EOS PCT: 3 %
Eosinophils Absolute: 0.4 10*3/uL (ref 0.0–0.7)
HEMATOCRIT: 26.6 % — AB (ref 39.0–52.0)
Hemoglobin: 9 g/dL — ABNORMAL LOW (ref 13.0–17.0)
Immature Granulocytes: 1 %
LYMPHS ABS: 1.7 10*3/uL (ref 0.7–4.0)
LYMPHS PCT: 16 %
MCH: 30.9 pg (ref 26.0–34.0)
MCHC: 33.8 g/dL (ref 30.0–36.0)
MCV: 91.4 fL (ref 78.0–100.0)
MONO ABS: 1 10*3/uL (ref 0.1–1.0)
Monocytes Relative: 9 %
Neutro Abs: 7.7 10*3/uL (ref 1.7–7.7)
Neutrophils Relative %: 71 %
Platelets: 319 10*3/uL (ref 150–400)
RBC: 2.91 MIL/uL — ABNORMAL LOW (ref 4.22–5.81)
RDW: 14.3 % (ref 11.5–15.5)
WBC: 10.9 10*3/uL — ABNORMAL HIGH (ref 4.0–10.5)

## 2018-04-05 MED ORDER — ONDANSETRON HCL 4 MG PO TABS: 4.0000 mg | ORAL_TABLET | ORAL | 0 refills | Status: DC | PRN

## 2018-04-05 MED ORDER — TOPIRAMATE 25 MG PO TABS
25.0000 mg | ORAL_TABLET | Freq: Every day | ORAL | Status: DC
  Administered 2018-04-05 – 2018-04-07 (×3): 25 mg via ORAL
  Filled 2018-04-05 (×3): qty 1

## 2018-04-05 MED ORDER — BISACODYL 10 MG RE SUPP: 10.0000 mg | Freq: Every day | RECTAL | 0 refills | Status: DC | PRN

## 2018-04-05 MED ORDER — BUTALBITAL-APAP-CAFFEINE 50-325-40 MG PO TABS: 1.0000 | ORAL_TABLET | ORAL | 0 refills | Status: DC | PRN

## 2018-04-05 MED ORDER — FAMOTIDINE 20 MG PO TABS: 20.0000 mg | ORAL_TABLET | Freq: Two times a day (BID) | ORAL | 1 refills | Status: DC

## 2018-04-05 MED ORDER — DOCUSATE SODIUM 50 MG/5ML PO LIQD: 100.0000 mg | Freq: Two times a day (BID) | ORAL | 0 refills | Status: DC

## 2018-04-05 MED ORDER — ACETAMINOPHEN 325 MG PO TABS: 650.0000 mg | ORAL_TABLET | ORAL | 2 refills | Status: AC | PRN

## 2018-04-05 MED ORDER — ACETAMINOPHEN 650 MG RE SUPP: 650.0000 mg | RECTAL | 0 refills | Status: DC | PRN

## 2018-04-05 MED ORDER — ONDANSETRON HCL 4 MG/2ML IJ SOLN: 4.0000 mg | INTRAMUSCULAR | 0 refills | Status: DC | PRN

## 2018-04-05 MED ORDER — POLYETHYLENE GLYCOL 3350 17 G PO PACK: 17.0000 g | PACK | Freq: Every day | ORAL | 0 refills | Status: DC | PRN

## 2018-04-05 NOTE — Discharge Summary (Deleted)
  The note originally documented on this encounter has been moved the the encounter in which it belongs.  

## 2018-04-05 NOTE — Evaluation (Signed)
Occupational Therapy Assessment and Plan  Patient Details  Name: Barry Miranda MRN: [id number] Date of Birth: 15-Jul-1961  OT Diagnosis: cognitive deficits, muscle weakness (generalized) and coordination disorder Rehab Potential:   ELOS: 7-10   Today's Date: 04/05/2018 OT Individual Time: 1000-1100 OT Individual Time Calculation (min): 60 min     Problem List:  Patient Active Problem List   Diagnosis Date Noted  . Midline shift of brain   . Subdural bleeding ([place])   . Hyperlipidemia   . Tobacco abuse   . Sinus tachycardia   . Reactive hypertension   . Hypernatremia   . Acute blood loss anemia   . Encounter for intubation   . Epidural hematoma ([place]) 03/25/2018  . Acute respiratory failure ([place])   . Non-traumatic acute subdural hematoma ([place]) 03/24/2018  . Subdural hematoma ([place]) 03/24/2018  . Chronic calculus cholecystitis 09/11/2011    Past Medical History:  Past Medical History:  Diagnosis Date  . Hyperlipidemia    Past Surgical History:  Past Surgical History:  Procedure Laterality Date  . CHOLECYSTECTOMY  2012  . CRANIECTOMY FOR DEPRESSED SKULL FRACTURE Right 03/28/2018   Procedure: CRANIECTOMY for cerebral edema with Placement of Skull Flap in Abdomen;  Surgeon: Consuella Lose, MD;  Location: [facility];  Service: Neurosurgery;  Laterality: Right;  . CRANIOTOMY Right 03/24/2018   Procedure: CRANIOTOMY, HEMATOMA EVACUATION SUBDURAL, RIGHT SIDE;  Surgeon: Erline Levine, MD;  Location: [facility];  Service: Neurosurgery;  Laterality: Right;  . CRANIOTOMY Right 03/24/2018   Procedure: CRANIOTOMY HEMATOMA EVACUATION EPIDURAL;  Surgeon: Kristeen Miss, MD;  Location: [facility];  Service: Neurosurgery;  Laterality: Right;  . LIPOMA EXCISION     lipoma on forehead   . WISDOM TOOTH EXTRACTION      Assessment & Plan Clinical Impression:   Barry Miranda is a 57 year old right-handed male with history of hyperlipidemia and tobacco abuse.  Per chart review and wife patient independent prior  to admission living with spouse.  He is an Conservation officer, nature.  Two-level home with bed and bath upstairs.  Presented 03/24/2018 with headache times several days with associated nausea and vomiting as well as photophobia.  Denied any trauma or falls.  Cranial CT scan reviewed, showing right SDH.  Per report, acute 1 cm right holohemispheric subdural hematoma resulting in a 1 cm right to left subfalcine herniation with left lateral ventricle entrapment.  Underwent craniotomy evacuation of hematoma 03/24/2018 per Dr. Vertell Limber.  Postoperative lethargy with follow-up CT demonstrating presence of acute epidural hematoma in the region of previous subdural hematoma and underwent right frontal temporal craniotomy evacuation 03/25/2018 per Dr. Ellene Route.  Patient remained intubated for a short time.  Patient did well until approximately 36 hours after latest surgery with progressive decline somewhat waxing and waning course.  Repeat scan demonstrated continued approximately 12 mm right subdural hematoma and 11 mm midline shift.  There was also effacement of the basal cisterns.  Patient again returned to the OR underwent decompressive right frontal parietal craniectomy implantation of bone flap and subcutaneous pocket 03/28/2018 per Dr. Kathyrn Sheriff.  Maintained on Keppra for seizure prophylaxis with EEG completed 03/28/2018 consistent with generalized and focal left cerebral dysfunction no seizure activity .   He was ultimately extubated 03/29/2018.  Patient had remained on 3% normal saline through 04/03/2018 and stopped.  Physical and occupational therapy evaluations completed and ongoing with recommendations of physical medicine rehab consult.  Patient was admitted for a comprehensive rehab program  Patient currently requires min with basic self-care skills secondary  to muscle weakness, decreased cardiorespiratoy endurance, decreased coordination, decreased initiation, decreased attention, decreased awareness, decreased problem solving,  decreased safety awareness, decreased memory and delayed processing and decreased standing balance and decreased balance strategies.  Prior to hospitalization, patient could complete BADL/IADL with independent .  Patient will benefit from skilled intervention to increase independence with basic self-care skills prior to discharge home with care partner.  Anticipate patient will require 24 hour supervision and follow up outpatient.  OT - End of Session Endurance Deficit: Yes OT Assessment Rehab Potential (ACUTE ONLY): Good OT Patient demonstrates impairments in the following area(s): Balance;Cognition;Endurance;Motor;Safety OT Basic ADL's Functional Problem(s): Grooming;Bathing;Dressing;Toileting OT Transfers Functional Problem(s): Toilet;Tub/Shower OT Plan OT Intensity: Minimum of 1-2 x/day, 45 to 90 minutes OT Frequency: 5 out of 7 days OT Duration/Estimated Length of Stay: 7-10 OT Treatment/Interventions: Balance/vestibular training;Discharge planning;Pain management;Self Care/advanced ADL retraining;Therapeutic Activities;UE/LE Coordination activities;Cognitive remediation/compensation;Disease mangement/prevention;Functional mobility training;Patient/family education;Therapeutic Exercise;Visual/perceptual remediation/compensation;Community reintegration;DME/adaptive equipment instruction;Neuromuscular re-education;Psychosocial support;UE/LE Strength taining/ROM OT Self Feeding Anticipated Outcome(s): no goal OT Basic Self-Care Anticipated Outcome(s): S OT Toileting Anticipated Outcome(s): S OT Bathroom Transfers Anticipated Outcome(s): S OT Recommendation Patient destination: Home Follow Up Recommendations: Outpatient OT Equipment Recommended: Tub/shower seat;To be determined   Skilled Therapeutic Intervention 1:1. Pt lethargic throuhgout session with eyes closed frequently and requiring increased time to process commands. Pt educated on OT role/purpose, CIR, ELOS and POC. Pt already  dressed and declines bathing d/t fatigue. Pt completes toilet transfer at ambulatory level with min guard to void urine on BSC over toilet. Pt washes hands at sink with min guard for standing balance. Only LOB noted during clothing management leaning forward when pants at ankle to advance pants hips. Pt completes walk in shower transfer with shower chair with min guard. Pt completes BUE dexterity assessments of box and blocks and 9HPT results below. Pt drops 3 pegs during 9HPT and OT retrieves from floor. Pt requires cueing to notice that 2 pegs still missing when testing LUE.  Box and blocks R: 26 L:25  9HPT R:37 seconds L: 68 seconds.   OT Evaluation Precautions/Restrictions  Precautions Precautions: Fall;Other (comment) Precaution Comments: R craniectomy with bone flap removed Restrictions Weight Bearing Restrictions: No General Chart Reviewed: Yes Vital Signs  Pain Pain Assessment Pain Score: 0-No pain(headache subsides with rest ) Home Living/Prior Functioning Home Living Family/patient expects to be discharged to:: Private residence Living Arrangements: Spouse/significant other, Children Available Help at Discharge: Family, Available 24 hours/day Type of Home: House Home Access: Level entry Home Layout: Two level, Bed/bath upstairs Alternate Level Stairs-Number of Steps: flight Alternate Level Stairs-Rails: Right, Left Bathroom Shower/Tub: Multimedia programmer: Standard  Lives With: Spouse, Daughter IADL History Homemaking Responsibilities: Yes Meal Prep Responsibility: Primary Laundry Responsibility: Primary Cleaning Responsibility: Primary Bill Paying/Finance Responsibility: Primary Shopping Responsibility: Primary Current License: Yes Mode of Transportation: Cab, Car Occupation: Full time employment Type of Occupation: Dealer work on [employer] Prior Function Level of Independence: Independent with basic ADLs, Independent with homemaking with  ambulation  Able to Take Stairs?: Yes Driving: Yes Vocation: Full time employment Leisure: Hobbies-yes (Comment) Comments: reading  ADL   Vision Baseline Vision/History: Wears glasses Wears Glasses: At all times Patient Visual Report: No change from baseline Vision Assessment?: Yes Eye Alignment: Within Functional Limits Ocular Range of Motion: Restricted on the left Tracking/Visual Pursuits: Decreased smoothness of eye movement to LEFT superior field Saccades: Additional eye shifts occurred during testing;Decreased speed of saccadic movement Depth Perception: Undershoots Perception  Perception: Within Functional Limits Praxis Praxis: Intact Cognition  Overall Cognitive Status: Impaired/Different from baseline Arousal/Alertness: Lethargic Orientation Level: Person;Place;Situation Person: Oriented Place: Oriented Situation: Oriented Year: (2006) Month: June Day of Week: Correct Memory: Impaired Immediate Memory Recall: Sock;Blue;Bed Memory Recall: Sock;Blue Memory Recall Sock: Without Cue Memory Recall Blue: Without Cue Attention: Selective Selective Attention: Impaired Awareness: Impaired Problem Solving: Impaired Safety/Judgment: Impaired Sensation Sensation Light Touch: Appears Intact Proprioception: Appears Intact Coordination Gross Motor Movements are Fluid and Coordinated: Yes Fine Motor Movements are Fluid and Coordinated: No Finger Nose Finger Test: decreased speed Bil L>R slightly undershoots on the L  Motor  Motor Motor: Other (comment) Motor - Skilled Clinical Observations: delayed initiation of movement and generalized weakness  Mobility  Bed Mobility Bed Mobility: Rolling Right;Rolling Left;Supine to Sit;Sit to Supine Rolling Right: Supervision/verbal cueing Rolling Left: Supervision/Verbal cueing Supine to Sit: Supervision/Verbal cueing Sit to Supine: Supervision/Verbal cueing Transfers Sit to Stand: Supervision/Verbal cueing Stand to Sit:  Supervision/Verbal cueing  Trunk/Postural Assessment  Cervical Assessment Cervical Assessment: Within Functional Limits Thoracic Assessment Thoracic Assessment: Within Functional Limits Lumbar Assessment Lumbar Assessment: Within Functional Limits Postural Control Postural Control: Deficits on evaluation(delayed )  Balance Balance Balance Assessed: Yes Standardized Balance Assessment Standardized Balance Assessment: Berg Balance Test(Pt performed 75% of test with eyes closed ) Berg Balance Test Sit to Stand: Able to stand without using hands and stabilize independently Standing Unsupported: Able to stand safely 2 minutes Sitting with Back Unsupported but Feet Supported on Floor or Stool: Able to sit safely and securely 2 minutes Stand to Sit: Sits safely with minimal use of hands Transfers: Able to transfer safely, minor use of hands Standing Unsupported with Eyes Closed: Able to stand 10 seconds safely Standing Ubsupported with Feet Together: Able to place feet together independently and stand 1 minute safely From Standing, Reach Forward with Outstretched Arm: Can reach confidently >25 cm (10") From Standing Position, Pick up Object from Floor: Able to pick up shoe, needs supervision From Standing Position, Turn to Look Behind Over each Shoulder: Turn sideways only but maintains balance Turn 360 Degrees: Able to turn 360 degrees safely but slowly Standing Unsupported, Alternately Place Feet on Step/Stool: Able to complete >2 steps/needs minimal assist Standing Unsupported, One Foot in Front: Able to take small step independently and hold 30 seconds Standing on One Leg: Able to lift leg independently and hold 5-10 seconds Total Score: 45 Dynamic Sitting Balance Sitting balance - Comments: Supervision assist with reaching tasks  Static Standing Balance Static Standing - Level of Assistance: 5: Stand by assistance Dynamic Standing Balance Dynamic Standing - Level of Assistance: 4:  Min assist Extremity/Trunk Assessment RUE Assessment RUE Assessment: Exceptions to WFL(generalized weakness; slow movements) LUE Assessment LUE Assessment: Exceptions to [facility] Active Range of Motion (AROM) Comments: (WFL; generalized weakness; decreased [place])   See Function Navigator for Current Functional Status.   Refer to Care Plan for Long Term Goals  Recommendations for other services: Therapeutic Recreation  Pet therapy and Outing/community reintegration   Discharge Criteria: Patient will be discharged from OT if patient refuses treatment 3 consecutive times without medical reason, if treatment goals not met, if there is a change in medical status, if patient makes no progress towards goals or if patient is discharged from hospital.  The above assessment, treatment plan, treatment alternatives and goals were discussed and mutually agreed upon: by patient  [name] 04/05/2018, 10:20 AM

## 2018-04-05 NOTE — IPOC Note (Signed)
Overall Plan of Care [facility]) Patient Details Name: Barry Miranda MRN: [id number] DOB: 12-11-1960  Admitting Diagnosis: <principal problem not specified>SDH  Hospital Problems: Active Problems:   Subdural hematoma (HCC)     Functional Problem List: Nursing Behavior, Edema, Endurance, Medication Management, Motor, Pain, Perception, Safety, Sensory, Skin Integrity  PT Balance, Behavior, Edema, Endurance, Pain, Safety, Skin Integrity  OT Balance, Cognition, Endurance, Motor, Safety  SLP Cognition  TR         Basic ADL's: OT Grooming, Bathing, Dressing, Toileting     Advanced  ADL's: OT       Transfers: PT Bed Mobility, Bed to Chair, Car, Furniture, Futures trader, Metallurgist: PT Ambulation, Emergency planning/management officer, Stairs     Additional Impairments: OT    SLP Social Cognition   Problem Solving, Memory, Attention, Awareness  TR      Anticipated Outcomes Item Anticipated Outcome  Self Feeding no goal  Swallowing      Basic self-care  S  Toileting  S   Bathroom Transfers S  Bowel/Bladder  mod I  Transfers  Mod I with LRAD   Locomotion  Supervision assiat at ambulatory level   Communication     Cognition  Min A  Pain  less than 2  Safety/Judgment  mod I   Therapy Plan: PT Intensity: Minimum of 1-2 x/day ,45 to 90 minutes PT Frequency: 5 out of 7 days PT Duration Estimated Length of Stay: 7-10 days  OT Intensity: Minimum of 1-2 x/day, 45 to 90 minutes OT Frequency: 5 out of 7 days OT Duration/Estimated Length of Stay: 7-10 SLP Intensity: Minumum of 1-2 x/day, 30 to 90 minutes SLP Frequency: 3 to 5 out of 7 days SLP Duration/Estimated Length of Stay: 7 to 10 days    Team Interventions: Nursing Interventions Patient/Family Education, Pain Management, Skin Care/Wound Management, Psychosocial Support, Disease Management/Prevention, Medication Management, Cognitive Remediation/Compensation, Discharge Planning  PT interventions  Ambulation/gait training, Balance/vestibular training, Cognitive remediation/compensation, Community reintegration, Discharge planning, Disease management/prevention, DME/adaptive equipment instruction, Functional mobility training, Neuromuscular re-education, Pain management, Patient/family education, Psychosocial support, Skin care/wound management, Splinting/orthotics, Stair training, Therapeutic Activities, Therapeutic Exercise, UE/LE Strength taining/ROM, UE/LE Coordination activities, Visual/perceptual remediation/compensation, Wheelchair propulsion/positioning  OT Interventions Balance/vestibular training, Discharge planning, Pain management, Self Care/advanced ADL retraining, Therapeutic Activities, UE/LE Coordination activities, Cognitive remediation/compensation, Disease mangement/prevention, Functional mobility training, Patient/family education, Therapeutic Exercise, Visual/perceptual remediation/compensation, Community reintegration, Engineer, drilling, Neuromuscular re-education, Psychosocial support, UE/LE Strength taining/ROM  SLP Interventions Functional tasks, Patient/family education, Therapeutic Activities, Cognitive remediation/compensation  TR Interventions    SW/CM Interventions Discharge Planning, Psychosocial Support, Patient/Family Education   Barriers to Discharge MD  Medical stability  Nursing Other (comments) none  PT Inaccessible home environment, Medical stability, Home environment access/layout    OT      SLP Home environment access/layout    SW       Team Discharge Planning: Destination: PT-Home ,OT- Home , SLP-Home Projected Follow-up: PT-Outpatient PT, OT-  Outpatient OT, SLP-Outpatient SLP, 24 hour supervision/assistance Projected Equipment Needs: PT-To be determined, OT- Tub/shower seat, To be determined, SLP-None recommended by SLP Equipment Details: PT- , OT-  Patient/family involved in discharge planning: PT- Patient,  OT-Patient,  SLP-Patient  MD ELOS: 7-10 days Medical Rehab Prognosis:  Excellent Assessment: The patient has been admitted for CIR therapies with the diagnosis of SDH. The team will be addressing functional mobility, strength, stamina, balance, safety, adaptive techniques and equipment, self-care, bowel and bladder mgt, patient and caregiver education,  NMR, cognition, communication. Goals have been set at mod I for mobility and self-care and supervision to min assist for cognition.    Meredith Staggers, MD, FAAPMR      See Team Conference Notes for weekly updates to the plan of care

## 2018-04-05 NOTE — H&P (Signed)
Physical Medicine and Rehabilitation Admission H&P       Chief Complaint  Patient presents with  . Headache  : HPI: Barry Miranda is a 57 year old right-handed male with history of hyperlipidemia and tobacco abuse.  Per chart review and wife patient independent prior to admission living with spouse.  He is an Conservation officer, nature.  Two-level home with bed and bath upstairs.  Presented 03/24/2018 with headache times several days with associated nausea and vomiting as well as photophobia.  Denied any trauma or falls.  Cranial CT scan reviewed, showing right SDH.  Per report, acute 1 cm right holohemispheric subdural hematoma resulting in a 1 cm right to left subfalcine herniation with left lateral ventricle entrapment.  Underwent craniotomy evacuation of hematoma 03/24/2018 per Dr. Vertell Limber.  Postoperative lethargy with follow-up CT demonstrating presence of acute epidural hematoma in the region of previous subdural hematoma and underwent right frontal temporal craniotomy evacuation 03/25/2018 per Dr. Ellene Route.  Patient remained intubated for a short time.  Patient did well until approximately 36 hours after latest surgery with progressive decline somewhat waxing and waning course.  Repeat scan demonstrated continued approximately 12 mm right subdural hematoma and 11 mm midline shift.  There was also effacement of the basal cisterns.  Patient again returned to the OR underwent decompressive right frontal parietal craniectomy implantation of bone flap and subcutaneous pocket 03/28/2018 per Dr. Kathyrn Sheriff.  Maintained on Keppra for seizure prophylaxis with EEG completed 03/28/2018 consistent with generalized and focal left cerebral dysfunction no seizure activity .   He was ultimately extubated 03/29/2018.  Patient had remained on 3% normal saline through 04/03/2018 and stopped.  Physical and occupational therapy evaluations completed and ongoing with recommendations of physical medicine rehab consult.  Patient was  admitted for a comprehensive rehab program.  Review of Systems  Constitutional: Negative for chills and fever.  HENT: Negative for hearing loss.   Eyes: Positive for photophobia.  Respiratory: Negative for cough and shortness of breath.   Cardiovascular: Negative for chest pain, palpitations and leg swelling.  Gastrointestinal: Positive for nausea and vomiting. Negative for constipation.  Genitourinary: Negative for dysuria, flank pain and hematuria.  Musculoskeletal: Positive for myalgias.  Skin: Negative for rash.  Neurological: Positive for headaches.  All other systems reviewed and are negative.      Past Medical History:  Diagnosis Date  . Hyperlipidemia         Past Surgical History:  Procedure Laterality Date  . CHOLECYSTECTOMY  2012  . CRANIECTOMY FOR DEPRESSED SKULL FRACTURE Right 03/28/2018   Procedure: CRANIECTOMY for cerebral edema with Placement of Skull Flap in Abdomen;  Surgeon: Consuella Lose, MD;  Location: [facility];  Service: Neurosurgery;  Laterality: Right;  . CRANIOTOMY Right 03/24/2018   Procedure: CRANIOTOMY, HEMATOMA EVACUATION SUBDURAL, RIGHT SIDE;  Surgeon: Erline Levine, MD;  Location: [facility];  Service: Neurosurgery;  Laterality: Right;  . CRANIOTOMY Right 03/24/2018   Procedure: CRANIOTOMY HEMATOMA EVACUATION EPIDURAL;  Surgeon: Kristeen Miss, MD;  Location: [facility];  Service: Neurosurgery;  Laterality: Right;  . LIPOMA EXCISION     lipoma on forehead   . WISDOM TOOTH EXTRACTION          Family History  Problem Relation Age of Onset  . Hypertension Mother   . Diabetes Mother    Social History:  reports that he has been smoking.  He has been smoking about 0.15 packs per day. He has never used smokeless tobacco. He reports that he drinks about 4.2 oz  of alcohol per week. He reports that he does not use drugs. Allergies: No Known Allergies       Medications Prior to Admission  Medication Sig Dispense Refill  . aspirin EC 81 MG tablet  Take 81 mg by mouth daily.    . Cholecalciferol (VITAMIN D) 2000 units CAPS Take 2,000 Units by mouth daily.     . simvastatin (ZOCOR) 20 MG tablet Take 20 mg by mouth every evening.      Drug Regimen Review Drug regimen was reviewed and remains appropriate with no significant issues identified  Home: Home Living Family/patient expects to be discharged to:: Private residence Living Arrangements: Spouse/significant other, Children Available Help at Discharge: Family, Available 24 hours/day Type of Home: House Home Access: Level entry Home Layout: Two level, Bed/bath upstairs Alternate Level Stairs-Number of Steps: flight Alternate Level Stairs-Rails: Right, Left Bathroom Shower/Tub: Multimedia programmer: Standard Home Equipment: None   Functional History: Prior Function Level of Independence: Independent Comments: Agricultural consultant Status:  Mobility: Bed Mobility Overal bed mobility: Needs Assistance Bed Mobility: Supine to Sit Supine to sit: Mod assist, +2 for safety/equipment Sit to supine: Min assist General bed mobility comments: cues for sequencing and to initiate movements, assist with BLE and to elevate trunk Transfers Overall transfer level: Needs assistance Equipment used: 2 person hand held assist Transfers: Sit to/from Stand, Stand Pivot Transfers Sit to Stand: Min assist, +2 physical assistance, +2 safety/equipment Stand pivot transfers: Min assist, +2 physical assistance, +2 safety/equipment General transfer comment: cues for sequencing, assist to rise and steady in standing; pt taking small shuffling steps to turn and sit in recliner using +2 HHA Ambulation/Gait Ambulation/Gait assistance: Min assist Gait Distance (Feet): 2 Feet Assistive device: 2 person hand held assist Gait Pattern/deviations: Step-through pattern, Decreased stride length General Gait Details: Side step to left to [facility].  Nursing did not want pt OOB.  BP was  elevated and incr with sitting up therefore sat EOB, stood briefly and then laid pt back down.  Gait velocity: decreased Gait velocity interpretation: <1.31 ft/sec, indicative of household ambulator Stairs: Yes Stairs assistance: Supervision Stair Management: No rails, Alternating pattern, Backwards, Forwards Number of Stairs: 4 General stair comments: steady without rail, but guarded.  ADL: ADL Overall ADL's : Needs assistance/impaired Eating/Feeding: Minimal assistance, Sitting Eating/Feeding Details (indicate cue type and reason): for ice chips and suction, cues for initiation Grooming: Wash/dry face, Min guard, Minimal assistance, Sitting Grooming Details (indicate cue type and reason): minA for static sitting balance EOB  Upper Body Bathing: Minimal assistance, Sitting Lower Body Bathing: Moderate assistance, Sit to/from stand Lower Body Bathing Details (indicate cue type and reason): limited by HA  Upper Body Dressing : Minimal assistance, Sitting Lower Body Dressing: Maximal assistance, Sit to/from stand Lower Body Dressing Details (indicate cue type and reason): assist to don socks sitting EOB this session (+2 for safety); MinA+2 for sit<>stand  Toilet Transfer: Minimal assistance, +2 for physical assistance, Stand-pivot Toilet Transfer Details (indicate cue type and reason): simulated by stand pivot EOB to chair. Pt able to stand with min assist to void in condom cath Toileting- Clothing Manipulation and Hygiene: Minimal assistance, Sit to/from stand Functional mobility during ADLs: Minimal assistance, +2 for physical assistance(HHA) General ADL Comments: pt tending to leave eyes closed during session, requires cues to open eyes and to maintain eyes open; pt sitting EOB for simple grooming ADLs and completing stand pivot to recliner   Cognition: Cognition Overall Cognitive Status: Impaired/Different from baseline  Orientation Level: Oriented to person, Disoriented to place,  Disoriented to time, Disoriented to situation Cognition Arousal/Alertness: Lethargic Behavior During Therapy: Flat affect Overall Cognitive Status: Impaired/Different from baseline Area of Impairment: Attention, Following commands, Problem solving Current Attention Level: Sustained Following Commands: Follows one step commands with increased time Problem Solving: Slow processing, Decreased initiation, Requires verbal cues General Comments: Minimally verbal, keeping eyes closed; overall following one step commands given increased time and multimodal cues    Physical Exam: Blood pressure (!) 163/77, pulse 98, temperature 99.2 F (37.3 C), temperature source Axillary, resp. rate 16, height 5\' 4"  (1.626 m), weight 69.5 kg (153 lb 3.5 oz), SpO2 99 %. Physical Exam  Vitals reviewed. Constitutional: No distress.  HENT:  Large craniectomy site with staples intact  Eyes: Pupils are equal, round, and reactive to light. EOM are normal.  Neck: Normal range of motion. No thyromegaly present.  Cardiovascular: Normal rate and regular rhythm. Exam reveals no friction rub.  Respiratory: Effort normal. No respiratory distress. He has no wheezes.  GI: He exhibits no distension. There is no tenderness.  Musculoskeletal: Normal range of motion. He exhibits no edema.  Neurological: He is alert.  Oriented to name, hospital, "brain problem". Follows basic commands, language appears intact. Very alert. Decreased insight and awareness. LUE  3+ to 4-/5. LLE 3+ to 4-/5. RUE and RLE 4+/5. Senses pain in all 4's.   Skin: Skin is warm and dry.  Psychiatric:  Flat but cooperative    LabResultsLast48Hours  Results for orders placed or performed during the hospital encounter of 03/23/18 (from the past 48 hour(s))  Sodium     Status: None   Collection Time: 03/30/18  6:20 PM  Result Value Ref Range   Sodium 141 135 - 145 mmol/L    Comment: Performed at [facility], [place] [address]  [place], [place] [address]  Sodium     Status: Abnormal   Collection Time: 03/30/18 11:57 PM  Result Value Ref Range   Sodium 147 (H) 135 - 145 mmol/L    Comment: Performed at [facility], [place] [address], [place], [place] [address]  Sodium     Status: Abnormal   Collection Time: 03/31/18  5:42 AM  Result Value Ref Range   Sodium 148 (H) 135 - 145 mmol/L    Comment: Performed at [facility], [place] [address] [place], [place] [address]  Sodium     Status: Abnormal   Collection Time: 03/31/18 12:32 PM  Result Value Ref Range   Sodium 149 (H) 135 - 145 mmol/L    Comment: Performed at [facility], [place] [address], [place], [place] [address]  Sodium     Status: None   Collection Time: 03/31/18  6:11 PM  Result Value Ref Range   Sodium 144 135 - 145 mmol/L    Comment: Performed at [facility] [address] [place], [place] [address]  Sodium     Status: Abnormal   Collection Time: 03/31/18 11:36 PM  Result Value Ref Range   Sodium 147 (H) 135 - 145 mmol/L    Comment: Performed at [facility], [place] [address], [place], [place] [address]  Sodium     Status: None   Collection Time: 04/01/18  6:00 AM  Result Value Ref Range   Sodium 145 135 - 145 mmol/L    Comment: Performed at [facility] [address], [place], [place] [address]  Sodium  Status: Abnormal   Collection Time: 04/01/18 12:10 PM  Result Value Ref Range   Sodium 147 (H) 135 - 145 mmol/L    Comment: Performed at [facility], [place] [address], [place], [place] [address]     ImagingResults(Last48hours)  No results found.       Medical Problem List and Plan: 1.  Decreased functional mobility with apraxia secondary to right SDH/EDH with initial craniotomy evacuation hematoma 2/83/1517 complicated by epidural hematoma with evacuation 03/25/2018 and follow-up decompressive right frontal parietal craniectomy implantation of bone flap 03/28/2018.                 -admit to inpatient rehab             -Will place order for helmet for protection (discussed with pt/wife) 2.  DVT Prophylaxis/Anticoagulation: SDH.  Monitor for any signs of DVT 3. Pain Management: Hydrocodone and Fioricet as needed. 4. Mood: Provide emotional support 5. Neuropsych: This patient is capable of making decisions on his own behalf. 6. Skin/Wound Care: Routine skin checks 7. Fluids/Electrolytes/Nutrition: Routine in and outs with follow-up chemistries 8.  Seizure prophylaxis.  Keppra 1000 mg every 12 hours as his seizure risk is high 9.  Hyperlipidemia.  Zocor 10.  Constipation.  Laxative assistance  Post Admission Physician Evaluation: 1. Functional deficits secondary  to non-traumatic subdural hematoma. 2. Patient is admitted to receive collaborative, interdisciplinary care between the physiatrist, rehab nursing staff, and therapy team. 3. Patient's level of medical complexity and substantial therapy needs in context of that medical necessity cannot be provided at a lesser intensity of care such as a SNF. 4. Patient has experienced substantial functional loss from his/her baseline which was documented above under the "Functional History" and "Functional Status" headings.  Judging by the patient's diagnosis, physical exam, and functional history, the patient has potential for functional progress which will result in measurable gains while on inpatient rehab.  These gains will be of substantial and practical use upon discharge  in facilitating mobility and self-care at the household level. 5. Physiatrist will provide 24 hour management of medical needs as well as oversight of the therapy plan/treatment and provide guidance as appropriate regarding the interaction of the two. 6. The Preadmission Screening has been reviewed and patient status is unchanged unless otherwise stated above. 7. 24 hour rehab nursing will assist with bladder management, bowel management, safety,  skin/wound care, disease management, medication administration, pain management and patient education  and help integrate therapy concepts, techniques,education, etc. 8. PT will assess and treat for/with: Lower extremity strength, range of motion, stamina, balance, functional mobility, safety, adaptive techniques and equipment, NMR, visual-spatial awareness, family ed.   Goals are: supervision. 9. OT will assess and treat for/with: ADL's, functional mobility, safety, upper extremity strength, adaptive techniques and equipment, NMR, family ed, ego support.   Goals are: supervision to min assist. Therapy may proceed with showering this patient. 10. SLP will assess and treat for/with: cognition, communication, family ed.  Goals are: supervision to min assist. 11. Case Management and Social Worker will assess and treat for psychological issues and discharge planning. 12. Team conference will be held weekly to assess progress toward goals and to determine barriers to discharge. 13. Patient will receive at least 3 hours of therapy per day at least 5 days per week. 14. ELOS: 12-17 days       15. Prognosis:  excellent    I have personally performed a face to face diagnostic evaluation of this patient  and formulated the key components of the plan.  Additionally, I have personally reviewed laboratory data, imaging studies, as well as relevant notes and concur with the physician assistant's documentation above. This exam and assessment was performed on 04/04/18  Meredith Staggers, MD, [facility]   Lavon Paganini Angiulli, PA-C

## 2018-04-05 NOTE — Care Management (Signed)
Inpatient [facility] Individual Statement of Services  Patient Name:  Barry Miranda  Date:  04/05/2018  Welcome to the [facility].  Our goal is to provide you with an individualized program based on your diagnosis and situation, designed to meet your specific needs.  With this comprehensive rehabilitation program, you will be expected to participate in at least 3 hours of rehabilitation therapies Monday-Friday, with modified therapy programming on the weekends.  Your rehabilitation program will include the following services:  Physical Therapy (PT), Occupational Therapy (OT), Speech Therapy (ST), 24 hour per day rehabilitation nursing, Therapeutic Recreaction (TR), Neuropsychology, Case Management (Social Worker), Rehabilitation Medicine, Nutrition Services and Pharmacy Services  Weekly team conferences will be held on Tuesdays to discuss your progress.  Your Social Worker will talk with you frequently to get your input and to update you on team discussions.  Team conferences with you and your family in attendance may also be held.  Expected length of stay: 7-10 days    Overall anticipated outcome: supervision  Depending on your progress and recovery, your program may change. Your Social Worker will coordinate services and will keep you informed of any changes. Your Social Worker's name and contact numbers are listed  below.  The following services may also be recommended but are not provided by the [facility] will be made to provide these services after discharge if needed.  Arrangements include referral to agencies that provide these services.  Your insurance has been verified to be:  [facility] Your primary doctor is:  Lennette Bihari Via  Pertinent information will be shared with your doctor and your insurance  company.  Social Worker:  [place], [place] or [phone]   Information discussed with and copy given to patient by: Lennart Pall, 04/05/2018, 3:55 PM

## 2018-04-05 NOTE — Discharge Summary (Signed)
Physician Discharge Summary  Patient ID: Barry Miranda MRN: [id number] DOB/AGE: September 29, 1961 57 y.o.  Physician Discharge Summary  Patient ID: Barry Miranda MRN: [id number] DOB/AGE: 11/08/1960 57 y.o.  Admit date: 03/23/2018 Discharge date: 04/04/2018  Admission Diagnoses:Acute right subdural hematoma with obtundation and left hemiparesis and coagulopathy  Discharge Diagnoses: Same Active Problems:   Subdural hematoma [facility])   Discharged Condition: good  Hospital Course: Patient underwent emergent right craniotomy for pan-hemispheric subdural hematoma at time of admission.  He initially improved a great deal after surgery, then complained of increasing head pain and became sleepier and was taken back to surgery for evacuation of recurrent SDH and EDH.  The patient improved, but continued to be sleepy and symptomatic.  EEG did not demonstrate seizure activity and repeat head CT was felt to demonstrate re-accumulation of SDH, so he was taken back to surgery for evacuation of recurrent SDH and also underwent a craniectomy.  His Keppra was also increased to 1000 mg BID.  After this, the patient continued to make progress and was eventually transferred to Rehab for intensive therapies.  Consults: pulmonary/intensive care and rehabilitation medicine  Significant Diagnostic Studies: Head CT scans  Treatments: surgery: Craniotomy and evacuation of SDH, then return to OR for evacuation of EDH, then craniectomy.  Correction of coagulopathy.  Discharge Exam: Blood pressure (!) 151/86, pulse 70, temperature 98.6 F (37 C), temperature source Oral, resp. rate 18, height 5\' 4"  (1.626 m), weight 69.5 kg (153 lb 3.5 oz), SpO2 100 %. Neurologic: Alert and oriented X 3, normal strength and tone. Normal symmetric reflexes. Normal coordination and gait Wound:CDI  Disposition: Rehab      Allergies as of 04/05/2018   No Known Allergies     Medication List    STOP taking these  medications   aspirin EC 81 MG tablet     TAKE these medications   acetaminophen 325 MG tablet Commonly known as:  TYLENOL Take 2 tablets (650 mg total) by mouth every 4 (four) hours as needed for mild pain (temp > 100.5).   acetaminophen 650 MG suppository Commonly known as:  TYLENOL Place 1 suppository (650 mg total) rectally every 4 (four) hours as needed for mild pain (temp > 100.5).   bisacodyl 10 MG suppository Commonly known as:  DULCOLAX Place 1 suppository (10 mg total) rectally daily as needed for moderate constipation.   butalbital-acetaminophen-caffeine 50-325-40 MG tablet Commonly known as:  FIORICET, ESGIC Take 1 tablet by mouth every 4 (four) hours as needed for headache.   docusate 50 MG/5ML liquid Commonly known as:  COLACE Take 10 mLs (100 mg total) by mouth 2 (two) times daily.   famotidine 20 MG tablet Commonly known as:  PEPCID Take 1 tablet (20 mg total) by mouth 2 (two) times daily.   levETIRAcetam 1000 MG tablet Commonly known as:  KEPPRA Take 1 tablet (1,000 mg total) by mouth 2 (two) times daily.   ondansetron 4 MG tablet Commonly known as:  ZOFRAN Take 1 tablet (4 mg total) by mouth every 4 (four) hours as needed for nausea or vomiting.   ondansetron 4 MG/2ML Soln injection Commonly known as:  ZOFRAN Inject 2 mLs (4 mg total) into the vein every 4 (four) hours as needed for nausea or vomiting.   polyethylene glycol packet Commonly known as:  MIRALAX / GLYCOLAX Take 17 g by mouth daily as needed for mild constipation.   simvastatin 20 MG tablet Commonly known as:  ZOCOR Take 20 mg by mouth  every evening.   Vitamin D 2000 units Caps Take 2,000 Units by mouth daily.        Signed: Peggyann Shoals, MD 04/05/2018, 8:44 AM

## 2018-04-05 NOTE — Progress Notes (Signed)
Social Work  Social Work Assessment and Plan  Patient Details  Name: Barry Miranda MRN: [id number] Date of Birth: Jun 18, 1961  Today's Date: 04/05/2018  Problem List:  Patient Active Problem List   Diagnosis Date Noted  . Midline shift of brain   . Subdural bleeding ([place])   . Hyperlipidemia   . Tobacco abuse   . Sinus tachycardia   . Reactive hypertension   . Hypernatremia   . Acute blood loss anemia   . Encounter for intubation   . Epidural hematoma ([place]) 03/25/2018  . Acute respiratory failure ([place])   . Non-traumatic acute subdural hematoma ([place]) 03/24/2018  . Subdural hematoma ([place]) 03/24/2018  . Chronic calculus cholecystitis 09/11/2011   Past Medical History:  Past Medical History:  Diagnosis Date  . Hyperlipidemia    Past Surgical History:  Past Surgical History:  Procedure Laterality Date  . CHOLECYSTECTOMY  2012  . CRANIECTOMY FOR DEPRESSED SKULL FRACTURE Right 03/28/2018   Procedure: CRANIECTOMY for cerebral edema with Placement of Skull Flap in Abdomen;  Surgeon: Consuella Lose, MD;  Location: [facility];  Service: Neurosurgery;  Laterality: Right;  . CRANIOTOMY Right 03/24/2018   Procedure: CRANIOTOMY, HEMATOMA EVACUATION SUBDURAL, RIGHT SIDE;  Surgeon: Erline Levine, MD;  Location: [facility];  Service: Neurosurgery;  Laterality: Right;  . CRANIOTOMY Right 03/24/2018   Procedure: CRANIOTOMY HEMATOMA EVACUATION EPIDURAL;  Surgeon: Kristeen Miss, MD;  Location: [facility];  Service: Neurosurgery;  Laterality: Right;  . LIPOMA EXCISION     lipoma on forehead   . WISDOM TOOTH EXTRACTION     Social History:  reports that he has been smoking.  He has been smoking about 0.15 packs per day. He has never used smokeless tobacco. He reports that he drinks about 4.2 oz of alcohol per week. He reports that he does not use drugs.  Family / Support Systems Marital Status: Married How Long?: 22 yrs Patient Roles: Spouse, Parent Spouse/Significant Other: wife, Barry Miranda @ (C)  [phone] Children: two daughters, 86 and 82 yrs old, living in the home. Other Supports: In-laws local Anticipated Caregiver: wife: Barry Miranda Ability/Limitations of Caregiver: wife and two daugthers can assist for 24/7 assist Caregiver Availability: 24/7 Family Dynamics: Pt describes his wife and family as very supportive. Denies any concerns about level of support available.  Social History Preferred language: English Religion: Buddhist Cultural Background: Pt originally from [place] and cam to the [place] when he was 57 yrs old Education: college Read: Yes Write: Yes Employment Status: Employed Name of Employer: Engineer, manufacturing systems Return to Work Plans: TBD Freight forwarder Issues: None Guardian/Conservator: None - per MD, pt is capable of making decisions on his own behalf   Abuse/Neglect Abuse/Neglect Assessment Can Be Completed: Yes Physical Abuse: Denies Verbal Abuse: Denies Sexual Abuse: Denies Exploitation of patient/patient's resources: Denies Self-Neglect: Denies  Emotional Status Pt's affect, behavior adn adjustment status: Pt lying in bed and agreeable to assessment interview despite fatigue.  Able to answer questions appropriately.  Denies any s/s of emotional distress, however, have referred for neuropsychology to further assess. Recent Psychosocial Issues: None Pyschiatric History: None Substance Abuse History: None  Patient / Family Perceptions, Expectations & Goals Pt/Family understanding of illness & functional limitations: Pt with basic understanding of his hemorrhage and current limitations/ need for CIR.   Premorbid pt/family roles/activities: Pt completely independent PTA.  Both he and wife working f/t. Anticipated changes in roles/activities/participation: Per goals of supervision, wife and daughter will need to assume caregiver support roles. Pt/family expectations/goals: "  Just to get home."  [employer]:  None Premorbid Home Care/DME Agencies: None Transportation available at discharge: yes Resource referrals recommended: Neuropsychology, Support group (specify)  Discharge Planning Living Arrangements: Spouse/significant other, Children Support Systems: Spouse/significant other, Children, Other relatives, Friends/neighbors Type of Residence: Private residence Insurance Resources: Multimedia programmer (specify)([employer] ) Financial Resources: Employment Financial Screen Referred: No Living Expenses: Higher education careers adviser Management: Patient Does the patient have any problems obtaining your medications?: No Home Management: pt and family share responsibilities. Patient/Family Preliminary Plans: Pt to return home with family providing 24/7 assistance. Social Work Anticipated Follow Up Needs: HH/OP  Clinical Impression Pleasant gentleman here following SDH/ crani.  Able to complete assessment interview without much difficulty but was very fatigued. He denies any emotional distress and seems to have basic understanding of his medical issues.  Have left message for wife to confirm d/c plans.  Team anticipating rather short LOS and supervision goals overall.  SW to follow for support and d/c planning needs.    Barry Miranda 04/05/2018, 3:52 PM

## 2018-04-05 NOTE — Evaluation (Signed)
Physical Therapy Assessment and Plan  Patient Details  Name: Barry Miranda MRN: [id number] Date of Birth: Dec 01, 1960  PT Diagnosis: Abnormality of gait, Cognitive deficits, Muscle weakness and Pain in head Rehab Potential: Excellent ELOS: 7-10 days    Today's Date: 04/05/2018 PT Individual Time: 0800-0925 PT Individual Time Calculation (min): 85 min    Problem List:  Patient Active Problem List   Diagnosis Date Noted  . Midline shift of brain   . Subdural bleeding ([place])   . Hyperlipidemia   . Tobacco abuse   . Sinus tachycardia   . Reactive hypertension   . Hypernatremia   . Acute blood loss anemia   . Encounter for intubation   . Epidural hematoma ([place]) 03/25/2018  . Acute respiratory failure ([place])   . Non-traumatic acute subdural hematoma ([place]) 03/24/2018  . Subdural hematoma ([place]) 03/24/2018  . Chronic calculus cholecystitis 09/11/2011    Past Medical History:  Past Medical History:  Diagnosis Date  . Hyperlipidemia    Past Surgical History:  Past Surgical History:  Procedure Laterality Date  . CHOLECYSTECTOMY  2012  . CRANIECTOMY FOR DEPRESSED SKULL FRACTURE Right 03/28/2018   Procedure: CRANIECTOMY for cerebral edema with Placement of Skull Flap in Abdomen;  Surgeon: Consuella Lose, MD;  Location: [facility];  Service: Neurosurgery;  Laterality: Right;  . CRANIOTOMY Right 03/24/2018   Procedure: CRANIOTOMY, HEMATOMA EVACUATION SUBDURAL, RIGHT SIDE;  Surgeon: Erline Levine, MD;  Location: [facility];  Service: Neurosurgery;  Laterality: Right;  . CRANIOTOMY Right 03/24/2018   Procedure: CRANIOTOMY HEMATOMA EVACUATION EPIDURAL;  Surgeon: Kristeen Miss, MD;  Location: [facility];  Service: Neurosurgery;  Laterality: Right;  . LIPOMA EXCISION     lipoma on forehead   . WISDOM TOOTH EXTRACTION      Assessment & Plan Clinical Impression: Patient is a44 year old right-handed male with history of hyperlipidemia and tobacco abuse. Per chart review and wife patient independent prior  to admission living with spouse. He is an Conservation officer, nature. Two-level home with bed and bath upstairs. Presented 03/24/2018 with headache times several days with associated nausea and vomiting as well as photophobia. Denied any trauma or falls. Cranial CT scan reviewed, showing right SDH. Per report, acute 1 cm right holohemispheric subdural hematoma resulting in a 1 cm right to left subfalcine herniation with left lateral ventricle entrapment. Underwent craniotomy evacuation of hematoma 03/24/2018 per Dr. Vertell Limber. Postoperative lethargy with follow-up CT demonstrating presence of acute epidural hematoma in the region of previous subdural hematoma and underwent right frontal temporal craniotomy evacuation 03/25/2018 per Dr. Ellene Route. Patient remained intubated for a short time. Patient did well until approximately 36 hours after latest surgery with progressive decline somewhat waxing and waning course. Repeat scan demonstrated continued approximately 12 mm right subdural hematoma and 11 mm midline shift. There was also effacement of the basal cisterns. Patient again returned to the OR underwent decompressive right frontal parietal craniectomy implantation of bone flap and subcutaneous pocket 03/28/2018 per Dr. Kathyrn Sheriff. Maintained on Keppra for seizure prophylaxis with EEG completed 03/28/2018 consistent with generalized and focal left cerebral dysfunction no seizure activity .He was ultimately extubated 03/29/2018.Patient had remained on 3% normal saline through 04/03/2018 and stopped. Physical and occupational therapy evaluations completed and ongoing with recommendations of physical medicine rehab consult. Patient transferred to CIR on 04/04/2018 .   Patient currently requires min with mobility secondary to muscle weakness, decreased coordination, ideational apraxia, decreased initiation and decreased standing balance, decreased postural control and decreased balance strategies.  Prior to  hospitalization,  patient was independent  with mobility and lived with Spouse, Daughter in a House home.  Home access is  Level entry.  Patient will benefit from skilled PT intervention to maximize safe functional mobility, minimize fall risk and decrease caregiver burden for planned discharge home with 24 hour supervision.  Anticipate patient will benefit from follow up OP at discharge.  PT - End of Session Activity Tolerance: Tolerates 10 - 20 min activity with multiple rests Endurance Deficit: Yes PT Assessment Rehab Potential (ACUTE/IP ONLY): Excellent PT Barriers to Discharge: Inaccessible home environment;Medical stability;Home environment access/layout PT Patient demonstrates impairments in the following area(s): Balance;Behavior;Edema;Endurance;Pain;Safety;Skin Integrity PT Transfers Functional Problem(s): Bed Mobility;Bed to Chair;Car;Furniture;Floor PT Locomotion Functional Problem(s): Ambulation;Wheelchair Mobility;Stairs PT Plan PT Intensity: Minimum of 1-2 x/day ,45 to 90 minutes PT Frequency: 5 out of 7 days PT Duration Estimated Length of Stay: 7-10 days  PT Treatment/Interventions: Ambulation/gait training;Balance/vestibular training;Cognitive remediation/compensation;Community reintegration;Discharge planning;Disease management/prevention;DME/adaptive equipment instruction;Functional mobility training;Neuromuscular re-education;Pain management;Patient/family education;Psychosocial support;Skin care/wound management;Splinting/orthotics;Stair training;Therapeutic Activities;Therapeutic Exercise;UE/LE Strength taining/ROM;UE/LE Coordination activities;Visual/perceptual remediation/compensation;Wheelchair propulsion/positioning PT Transfers Anticipated Outcome(s): Mod I with LRAD  PT Locomotion Anticipated Outcome(s): Supervision assiat at ambulatory level  PT Recommendation Recommendations for Other Services: Neuropsych consult;Therapeutic Recreation consult Therapeutic Recreation  Interventions: Outing/community reintergration Follow Up Recommendations: Outpatient PT Patient destination: Home Equipment Recommended: To be determined  Skilled Therapeutic Intervention PT instructed patient in PT Evaluation and initiated treatment intervention; see below for results. PT educated patient in [place], rehab potential, rehab goals, and discharge recommendations. Patient demonstrates increased fall risk as noted by score of   45/56 on Berg Balance Scale.  (<36= high risk for falls, close to 100%; 37-45 significant >80%; 46-51 moderate >50%; 52-55 lower >25%) Pt reports need for BM at end of balance test, Pt transported to restroom and performed stand pivot to toilet with min assist. Supervision assist clothing management and pericare. Pt returned to room and performed stand pivot transfer to bed with min assist. Sit>supine completed with supervision assist. Pt left supine in bed with call bell in reach and all needs met.      PT Evaluation Precautions/Restrictions   General   Vital SignsTherapy Vitals Temp: 98.6 F (37 C) Temp Source: Oral Pulse Rate: 70 Resp: 18 BP: (!) 151/86 Patient Position (if appropriate): Lying Oxygen Therapy SpO2: 100 % O2 Device: Room Air Pain   7/10 head ache. Sharp pain.  Home Living/Prior Functioning Home Living Available Help at Discharge: Family;Available 24 hours/day Type of Home: House Home Access: Level entry Home Layout: Two level;Bed/bath upstairs Alternate Level Stairs-Number of Steps: flight Alternate Level Stairs-Rails: Right;Left Bathroom Shower/Tub: Multimedia programmer: Standard  Lives With: Spouse;Daughter Prior Function Level of Independence: Independent with basic ADLs;Independent with homemaking with ambulation  Able to Take Stairs?: Yes Driving: Yes Vocation: Full time employment Leisure: Hobbies-yes (Comment) Comments: Conservation officer, nature Vision/Perception  Vision - Assessment Eye Alignment: Within  Functional Limits Tracking/Visual Pursuits: Decreased smoothness of eye movement to LEFT superior field Saccades: Additional eye shifts occurred during testing;Decreased speed of saccadic movement Perception Perception: Within Functional Limits Praxis Praxis: Intact  Cognition Overall Cognitive Status: Impaired/Different from baseline Orientation Level: Oriented to person;Oriented to place;Oriented to situation;Disoriented to time Memory: Impaired Awareness: Impaired Problem Solving: Impaired Safety/Judgment: Impaired Sensation Sensation Light Touch: Appears Intact Proprioception: Appears Intact Coordination Gross Motor Movements are Fluid and Coordinated: Yes Fine Motor Movements are Fluid and Coordinated: Yes Finger Nose Finger Test: decreased speed Bil L>R slightly undershoots on the L  Motor  Motor Motor: Other (comment)  Motor - Skilled Clinical Observations: delayed initiation of movement and generalized weakness   Mobility Bed Mobility Bed Mobility: Rolling Right;Rolling Left;Supine to Sit;Sit to Supine Rolling Right: Supervision/verbal cueing Rolling Left: Supervision/Verbal cueing Supine to Sit: Supervision/Verbal cueing Sit to Supine: Supervision/Verbal cueing Transfers Transfers: Sit to Stand;Stand to Sit;Stand Pivot Transfers Sit to Stand: Supervision/Verbal cueing Stand to Sit: Supervision/Verbal cueing Stand Pivot Transfers: Minimal Assistance - Patient > 75% Stand Pivot Transfer Details: Verbal cues for precautions/safety Transfer (Assistive device): None Locomotion  Gait Ambulation: Yes Gait Assistance: Minimal Assistance - Patient > 75% Gait Distance (Feet): 150 Feet Assistive device: None Gait Gait: Yes Gait Pattern: Narrow base of support Stairs / Additional Locomotion Stairs: Yes Stairs Assistance: Minimal Assistance - Patient > 75% Stair Management Technique: Two rails Number of Stairs: 12 Height of Stairs: 6 Wheelchair Mobility Wheelchair  Mobility: Yes Wheelchair Assistance: Development worker, international aid: Both upper extremities Wheelchair Parts Management: Needs assistance Distance: 136f   Trunk/Postural Assessment  Cervical Assessment Cervical Assessment: Within Functional Limits Thoracic Assessment Thoracic Assessment: Within Functional Limits Lumbar Assessment Lumbar Assessment: Within Functional Limits Postural Control Postural Control: Deficits on evaluation(delayed righting reactions in standing )  Balance Standardized Balance Assessment Standardized Balance Assessment: Berg Balance Test(Pt performed 75% of test with eyes closed ) Berg Balance Test Sit to Stand: Able to stand without using hands and stabilize independently Standing Unsupported: Able to stand safely 2 minutes Sitting with Back Unsupported but Feet Supported on Floor or Stool: Able to sit safely and securely 2 minutes Stand to Sit: Sits safely with minimal use of hands Transfers: Able to transfer safely, minor use of hands Standing Unsupported with Eyes Closed: Able to stand 10 seconds safely Standing Ubsupported with Feet Together: Able to place feet together independently and stand 1 minute safely From Standing, Reach Forward with Outstretched Arm: Can reach confidently >25 cm (10") From Standing Position, Pick up Object from Floor: Able to pick up shoe, needs supervision From Standing Position, Turn to Look Behind Over each Shoulder: Turn sideways only but maintains balance Turn 360 Degrees: Able to turn 360 degrees safely but slowly Standing Unsupported, Alternately Place Feet on Step/Stool: Able to complete >2 steps/needs minimal assist Standing Unsupported, One Foot in Front: Able to take small step independently and hold 30 seconds Standing on One Leg: Able to lift leg independently and hold 5-10 seconds Total Score: 45 Dynamic Sitting Balance Sitting balance - Comments: Supervision assist with reaching tasks   Static Standing Balance Static Standing - Level of Assistance: 5: Stand by assistance Dynamic Standing Balance Dynamic Standing - Level of Assistance: 4: Min assist Extremity Assessment      RLE Assessment RLE Assessment: Within Functional Limits General Strength Comments: grossly 4+/5 proximal to distal  LLE Assessment LLE Assessment: Exceptions to WBloom[facility] Endoscopy CenterGeneral Strength Comments: grossly 4+/5 proximal to distal expect hip flexion    See Function Navigator for Current Functional Status.   Refer to Care Plan for Long Term Goals  Recommendations for other services: Neuropsych and Therapeutic Recreation  Stress management and Outing/community reintegration  Discharge Criteria: Patient will be discharged from PT if patient refuses treatment 3 consecutive times without medical reason, if treatment goals not met, if there is a change in medical status, if patient makes no progress towards goals or if patient is discharged from hospital.  The above assessment, treatment plan, treatment alternatives and goals were discussed and mutually agreed upon: by patient  ALorie Phenix6/28/2019, 9:33 AM

## 2018-04-05 NOTE — Evaluation (Signed)
Speech Language Pathology Assessment and Plan  Patient Details  Name: Barry Miranda MRN: [id number] Date of Birth: [birth year]  SLP Diagnosis: Cognitive Impairments  Rehab Potential: Good ELOS: 7 to 10 days    Today's Date: 04/05/2018 SLP Individual Time:  -      Problem List:  Patient Active Problem List   Diagnosis Date Noted  . Midline shift of brain   . Subdural bleeding ([place])   . Hyperlipidemia   . Tobacco abuse   . Sinus tachycardia   . Reactive hypertension   . Hypernatremia   . Acute blood loss anemia   . Encounter for intubation   . Epidural hematoma ([place]) 03/25/2018  . Acute respiratory failure ([place])   . Non-traumatic acute subdural hematoma ([place]) 03/24/2018  . Subdural hematoma ([place]) 03/24/2018  . Chronic calculus cholecystitis 09/11/2011   Past Medical History:  Past Medical History:  Diagnosis Date  . Hyperlipidemia    Past Surgical History:  Past Surgical History:  Procedure Laterality Date  . CHOLECYSTECTOMY  2012  . CRANIECTOMY FOR DEPRESSED SKULL FRACTURE Right 03/28/2018   Procedure: CRANIECTOMY for cerebral edema with Placement of Skull Flap in Abdomen;  Surgeon: Consuella Lose, MD;  Location: [facility] [place] [facility];  Service: Neurosurgery;  Laterality: Right;  . CRANIOTOMY Right 03/24/2018   Procedure: CRANIOTOMY, HEMATOMA EVACUATION SUBDURAL, RIGHT SIDE;  Surgeon: Erline Levine, MD;  Location: [facility];  Service: Neurosurgery;  Laterality: Right;  . CRANIOTOMY Right 03/24/2018   Procedure: CRANIOTOMY HEMATOMA EVACUATION EPIDURAL;  Surgeon: Kristeen Miss, MD;  Location: [facility];  Service: Neurosurgery;  Laterality: Right;  . LIPOMA EXCISION     lipoma on forehead   . WISDOM TOOTH EXTRACTION      Assessment / Plan / Recommendation Clinical Impression Randy Castrejon a 57 year old right-handed male with history of hyperlipidemia and tobacco abuse. Per chart review and wife patient independent prior to admission living with spouse. He is an Conservation officer, nature.  Two-level home with bed and bath upstairs. Presented 03/24/2018 with headache times several days with associated nausea and vomiting as well as photophobia. Denied any trauma or falls. Cranial CT scan reviewed, showing right SDH. Per report, acute 1 cm right holohemispheric subdural hematoma resulting in a 1 cm right to left subfalcine herniation with left lateral ventricle entrapment. Underwent craniotomy evacuation of hematoma 03/24/2018 per Dr. Vertell Limber. Postoperative lethargy with follow-up CT demonstrating presence of acute epidural hematoma in the region of previous subdural hematoma and underwent right frontal temporal craniotomy evacuation 03/25/2018 per Dr. Ellene Route. Patient remained intubated for a short time. Patient did well until approximately 36 hours after latest surgery with progressive decline somewhat waxing and waning course. Repeat scan demonstrated continued approximately 12 mm right subdural hematoma and 11 mm midline shift. There was also effacement of the basal cisterns. Patient again returned to the OR underwent decompressive right frontal parietal craniectomy implantation of bone flap and subcutaneous pocket 03/28/2018 per Dr. Kathyrn Sheriff. Maintained on Keppra for seizure prophylaxis with EEG completed 03/28/2018 consistent with generalized and focal left cerebral dysfunction no seizure activity .He was ultimately extubated 03/29/2018.Patient had remained on 3% normal saline through 04/03/2018 and stopped. Physical and occupational therapy evaluations completed and ongoing with recommendations of physical medicine rehab consult. Patient was admitted for a comprehensive rehab program on 04/04/18.   Cognitive linguistic evaluation completed on 04/05/18. Pt presents with moderate cognitive deficits c/b deficits in problem solving, selective attention, impulsivity, left attention, intellectual awareness, delayed processing and recall of new information. At times, pt was difficult  to  engage in more advanced tasks d/t c/o headache and fatigue. However this fluctuated from task to task perhaps indicative of poor task tolerance. Skilled ST is required to target the above mentioned deficits to independence functional independence and reduce caregiver burden. Anticipate that pt will need Min A for cognition, safety awareness at discharge with Outpatient ST most appropriate.   Skilled Therapeutic Interventions          Skilled treatment session focused on completion of cognitive linguistic evaluation, see above. Pt required Max A to Mod A for engagement in some tasks, Max A for impulsivity when attempting to get out of bed to use urinal, Mod A for processing when attempting to answer questions. Pt was handed off to nursing.      SLP Assessment  Patient will need skilled [facility] during CIR admission    Recommendations  Patient destination: Home Follow up Recommendations: Outpatient SLP;24 hour supervision/assistance Equipment Recommended: None recommended by SLP    SLP Frequency 3 to 5 out of 7 days   SLP Duration  SLP Intensity  SLP Treatment/Interventions 7 to 10 days  Minumum of 1-2 x/day, 30 to 90 minutes  Functional tasks;Patient/family education;Therapeutic Activities;Cognitive remediation/compensation    Pain Pain Assessment Pain Score: 0-No pain(headache subsides with rest )  Prior Functioning Cognitive/Linguistic Baseline: Within functional limits Type of Home: House  Lives With: Spouse;Daughter Available Help at Discharge: Family;Available 24 hours/day Education: highschool Vocation: Full time employment  Function:  Eating Eating   Modified Consistency Diet: No Eating Assist Level: No help, No cues           Cognition Comprehension Comprehension assist level: Understands complex 90% of the time/cues 10% of the time  Expression   Expression assist level: Expresses basic 90% of the time/requires cueing < 10% of the  time.  Social Interaction Social Interaction assist level: Interacts appropriately 75 - 89% of the time - Needs redirection for appropriate language or to initiate interaction.  Problem Solving Problem solving assist level: Solves basic 75 - 89% of the time/requires cueing 10 - 24% of the time  Memory Memory assist level: Recognizes or recalls 75 - 89% of the time/requires cueing 10 - 24% of the time   Short Term Goals: Week 1: SLP Short Term Goal 1 (Week 1): Pt will utilize external memory aides to recall new information with Min A cues.  SLP Short Term Goal 2 (Week 1): Pt will demonstrate intellectual awareness identifying 2 physical and 2 cognitive deficits related to BI with Min A cues.  SLP Short Term Goal 3 (Week 1): Pt will scan to left of midline to locate objects with Mod A cues.  SLP Short Term Goal 4 (Week 1): Pt will complete semi-complex problem solving tasks with Min A cues.  SLP Short Term Goal 5 (Week 1): Pt will demonstrate selective attention for ~ 30 minutes in mildly distracting environment for ~ 30 minutes.   Refer to Care Plan for Long Term Goals  Recommendations for other services: Neuropsych  Discharge Criteria: Patient will be discharged from SLP if patient refuses treatment 3 consecutive times without medical reason, if treatment goals not met, if there is a change in medical status, if patient makes no progress towards goals or if patient is discharged from hospital.  The above assessment, treatment plan, treatment alternatives and goals were discussed and mutually agreed upon: by patient  Jullien Granquist 04/05/2018, 12:11 PM

## 2018-04-05 NOTE — Progress Notes (Signed)
[facility] PHYSICAL MEDICINE & REHABILITATION     PROGRESS NOTE    Subjective/Complaints: Still having headaches, pretty steadily around 6-7/10. Also complaining of left hip pain.  ROS: Patient denies fever, rash, sore throat, blurred vision, nausea, vomiting, diarrhea, cough, shortness of breath or chest pain,  or mood change.   Objective:  No results found. Recent Labs    04/05/18 0506  WBC 10.9*  HGB 9.0*  HCT 26.6*  PLT 319   Recent Labs    04/04/18 1244 04/05/18 0506  NA 143 142  K  --  3.2*  CL  --  105  GLUCOSE  --  106*  BUN  --  7  CREATININE  --  0.60*  CALCIUM  --  8.1*   CBG (last 3)  No results for input(s): GLUCAP in the last 72 hours.  Wt Readings from Last 3 Encounters:  04/04/18 69.5 kg (153 lb 3.5 oz)  03/24/18 69.5 kg (153 lb 3.5 oz)  10/17/11 71.5 kg (157 lb 9.6 oz)     Intake/Output Summary (Last 24 hours) at 04/05/2018 9326 Last data filed at 04/05/2018 0659 Gross per 24 hour  Intake 360 ml  Output 1900 ml  Net -1540 ml    Vital Signs: Blood pressure (!) 151/86, pulse 70, temperature 98.6 F (37 C), temperature source Oral, resp. rate 18, height 5\' 4"  (1.626 m), weight 69.5 kg (153 lb 3.5 oz), SpO2 100 %. Physical Exam:  Constitutional: No distress . Vital signs reviewed. HEENT: EOMI, oral membranes moist. Crani wound intact Neck: supple Cardiovascular: RRR without murmur. No JVD    Respiratory: CTA Bilaterally without wheezes or rales. Normal effort    GI: BS +, flap in RLQ, staples intact. Sl tender Musculoskeletal:No pain with A/PROM left hip or with palpation of greater troch, glutes, PSIS.  Neurological: He isalert. Oriented to self and place, reason, language appears intact. Very alert. Decreased insight and awareness. LUE 3+ to 4-/5. LLE 3+ to 4-/5. RUE and RLE 4+/5. Senses pain in all 4's.  Skin: Skin iswarmand dry.  Psychiatric: Flat but cooperative    Assessment/Plan: 1. Functional deficits secondary to  right SDH which require 3+ hours per day of interdisciplinary therapy in a comprehensive inpatient rehab setting. Physiatrist is providing close team supervision and 24 hour management of active medical problems listed below. Physiatrist and rehab team continue to assess barriers to discharge/monitor patient progress toward functional and medical goals.  Function:  Bathing Bathing position      Bathing parts      Bathing assist        Upper Body Dressing/Undressing Upper body dressing                    Upper body assist        Lower Body Dressing/Undressing Lower body dressing                                  Lower body assist        Toileting Toileting          Toileting assist     Transfers Chair/bed transfer             Locomotion Ambulation           Wheelchair          Cognition Comprehension Comprehension assist level: Follows complex conversation/direction with extra time/assistive device  Expression Expression assist  level: Expresses basic 90% of the time/requires cueing < 10% of the time.  Social Interaction Social Interaction assist level: Interacts appropriately 75 - 89% of the time - Needs redirection for appropriate language or to initiate interaction.  Problem Solving Problem solving assist level: Solves basic 75 - 89% of the time/requires cueing 10 - 24% of the time  Memory Memory assist level: Recognizes or recalls 75 - 89% of the time/requires cueing 10 - 24% of the time   Medical Problem List and Plan: 1.Decreased functional mobility with apraxiasecondary to right SDH/EDHwith initial craniotomy evacuation hematoma 7/35/6701 complicated by epidural hematoma with evacuation 03/25/2018 and follow-up decompressive right frontal parietal craniectomy implantation of bone flap 03/28/2018.  -beginning therapies today -helmet ordered for cranial protection 2. DVT Prophylaxis/Anticoagulation: SDH.  Monitor for any signs of DVT 3. Pain Management:Hydrocodone and Fioricet as needed.  -will add low dose topamax 25mg  qhs 4. Mood:Provide emotional support 5. Neuropsych: This patientiscapable of making decisions on hisown behalf. 6. Skin/Wound Care:Routine skin checks 7. Fluids/Electrolytes/Nutrition:encourage PO  -I personally reviewed the patient's labs today.   8.Seizure prophylaxis. Keppra 1000 mg every 12 hours as his seizure risk is high 9.Hyperlipidemia. Zocor 10.Constipation. Laxative assistance   LOS (Days) [place] EVALUATION WAS PERFORMED  Meredith Staggers, MD 04/05/2018 9:22 AM

## 2018-04-06 ENCOUNTER — Inpatient Hospital Stay (HOSPITAL_COMMUNITY): Payer: 59

## 2018-04-06 ENCOUNTER — Inpatient Hospital Stay (HOSPITAL_COMMUNITY): Payer: 59 | Admitting: Occupational Therapy

## 2018-04-06 ENCOUNTER — Inpatient Hospital Stay (HOSPITAL_COMMUNITY): Payer: 59 | Admitting: Physical Therapy

## 2018-04-06 DIAGNOSIS — E876 Hypokalemia: Secondary | ICD-10-CM

## 2018-04-06 DIAGNOSIS — E46 Unspecified protein-calorie malnutrition: Secondary | ICD-10-CM

## 2018-04-06 DIAGNOSIS — S065X9A Traumatic subdural hemorrhage with loss of consciousness of unspecified duration, initial encounter: Secondary | ICD-10-CM

## 2018-04-06 DIAGNOSIS — K029 Dental caries, unspecified: Secondary | ICD-10-CM

## 2018-04-06 DIAGNOSIS — E8809 Other disorders of plasma-protein metabolism, not elsewhere classified: Secondary | ICD-10-CM

## 2018-04-06 DIAGNOSIS — D62 Acute posthemorrhagic anemia: Secondary | ICD-10-CM

## 2018-04-06 DIAGNOSIS — D72829 Elevated white blood cell count, unspecified: Secondary | ICD-10-CM

## 2018-04-06 MED ORDER — BENZOCAINE 10 % MT GEL
Freq: Three times a day (TID) | OROMUCOSAL | Status: DC | PRN
  Filled 2018-04-06: qty 9

## 2018-04-06 MED ORDER — POTASSIUM CHLORIDE CRYS ER 20 MEQ PO TBCR
30.0000 meq | EXTENDED_RELEASE_TABLET | Freq: Two times a day (BID) | ORAL | Status: AC
  Administered 2018-04-06 – 2018-04-07 (×4): 30 meq via ORAL
  Filled 2018-04-06 (×4): qty 1

## 2018-04-06 MED ORDER — PRO-STAT SUGAR FREE PO LIQD
30.0000 mL | Freq: Two times a day (BID) | ORAL | Status: DC
  Administered 2018-04-06 – 2018-04-13 (×15): 30 mL via ORAL
  Filled 2018-04-06 (×15): qty 30

## 2018-04-06 NOTE — Plan of Care (Signed)
  Problem: Consults Goal: RH BRAIN INJURY PATIENT EDUCATION Description Description: See Patient Education module for eduction specifics. Mod assist  Outcome: Progressing Goal: Skin Care Protocol Initiated - if Braden Score 18 or less Description If consults are not indicated, leave blank or document N/A Outcome: Progressing Goal: Nutrition Consult-if indicated Outcome: Progressing Goal: Diabetes Guidelines if Diabetic/Glucose > 140 Description If diabetic or lab glucose is > 140 mg/dl - Initiate Diabetes/Hyperglycemia Guidelines & Document Interventions  Outcome: Progressing   Problem: RH BOWEL ELIMINATION Goal: RH STG MANAGE BOWEL WITH ASSISTANCE Description STG Manage Bowel with Assistance.mod assist  Outcome: Progressing Goal: RH STG MANAGE BOWEL W/MEDICATION W/ASSISTANCE Description STG Manage Bowel with Medication with Assistance. Outcome: Progressing   Problem: RH BLADDER ELIMINATION Goal: RH STG MANAGE BLADDER WITH ASSISTANCE Description STG Manage Bladder With Assistance. Mod assist  Outcome: Progressing   Problem: RH SKIN INTEGRITY Goal: RH STG SKIN FREE OF INFECTION/BREAKDOWN Description Patient will be free of skin infection entire stay on rehab  Outcome: Progressing Goal: RH STG MAINTAIN SKIN INTEGRITY WITH ASSISTANCE Description STG Maintain Skin Integrity With Assistance.mod assist  Outcome: Progressing   Problem: RH SAFETY Goal: RH STG ADHERE TO SAFETY PRECAUTIONS W/ASSISTANCE/DEVICE Description STG Adhere to Safety Precautions With Assistance/Device.mod assist  Outcome: Progressing

## 2018-04-06 NOTE — Progress Notes (Signed)
Occupational Therapy Session Note  Patient Details  Name: Barry Miranda MRN: [id number] Date of Birth: 08/06/1961  Today's Date: 04/06/2018 OT Individual Time: 0700-0730 OT Individual Time Calculation (min): 30 min    Short Term Goals: Week 1:  OT Short Term Goal 1 (Week 1): STG=LTG d/t ELOS  Skilled Therapeutic Interventions/Progress Updates:    1;1. Pt and wife present. Wife educated on rehab stay, OT role/purpose and safety during transfers. Pt and wife demo safe ambulatory transfer EOB<>BSC over toilet with wife providing min guard. Wife asks appropriate questions and demo without cuing for safety. Pt able to don non skid socks at EOB prior to transfer. Pt eats breakfast with VC to incorporate LUE use when opening containers and peeling hard boiled egg with extra time to improve [facility].   Therapy Documentation Precautions:  Precautions Precautions: Fall, Other (comment) Precaution Comments: R craniectomy with bone flap removed Restrictions Weight Bearing Restrictions: No General:   Vital Signs: Therapy Vitals Temp: 98 F (36.7 C) Temp Source: Oral Pulse Rate: 68 Resp: 18 BP: (!) 141/85 Patient Position (if appropriate): Lying Oxygen Therapy SpO2: 100 % O2 Device: Room Air Pain:  :    See Function Navigator for Current Functional Status.   Therapy/Group: Individual Therapy  [name] 04/06/2018, 7:31 AM

## 2018-04-06 NOTE — Progress Notes (Signed)
Speech Language Pathology Daily Session Note  Patient Details  Name: Barry Miranda MRN: [id number] Date of Birth: 11/22/1960  Today's Date: 04/06/2018 SLP Individual Time: 1005-1100 SLP Individual Time Calculation (min): 55 min  Short Term Goals: Week 1: SLP Short Term Goal 1 (Week 1): Pt will utilize external memory aides to recall new information with Min A cues.  SLP Short Term Goal 2 (Week 1): Pt will demonstrate intellectual awareness identifying 2 physical and 2 cognitive deficits related to BI with Min A cues.  SLP Short Term Goal 3 (Week 1): Pt will scan to left of midline to locate objects with Mod A cues.  SLP Short Term Goal 4 (Week 1): Pt will complete semi-complex problem solving tasks with Min A cues.  SLP Short Term Goal 5 (Week 1): Pt will demonstrate selective attention for ~ 30 minutes in mildly distracting environment for ~ 30 minutes.   Skilled Therapeutic Interventions: Pt seen for cognitive tx. SLP facilitated today's session by providing Max A verbal cues for use of external aides to recall 5 out of 5 pieces of functional information re: safety precautions, fall precautions, RN information, call bell, and date; pt independently recalled 2 out of 5 pieces of information without cues. Scanned to left of midline to locate 5 out of 5 items on bedside tray and placed 5 out of 5 items back on L side given min verbal cues at the onset of the activity; no further cues needed during the activity. Max A cues were required to identify 2 physical and 2 cognitive deficits related to [name]. Completed the MoCA with pt presenting with severe cognitive-linguistic deficits as indicative by informal assessment and a score of 10 out of 30 ( n = greater than or equal to 26/30). He demonstrates difficulty in all cognitive domains including deficits in recall, sustained attention, executive functioning, and problem-solving, though suspect some errors to be secondary to a language barrier,  particularly with tasks that required more complex auditory comprehension and expressive language. Pt did report a 5 out of 10 headache, which was reported to RN. Pt left in bed with call bell within reach, bed alarm on, and all needs met. SLP to continue to address current goals.   Function: Eating Eating   Modified Consistency Diet: No Eating Assist Level: No help, No cues           Cognition Comprehension Comprehension assist level: Understands complex 90% of the time/cues 10% of the time  Expression   Expression assist level: Expresses basic 90% of the time/requires cueing < 10% of the time.  Social Interaction Social Interaction assist level: Interacts appropriately 75 - 89% of the time - Needs redirection for appropriate language or to initiate interaction.  Problem Solving Problem solving assist level: Solves basic 75 - 89% of the time/requires cueing 10 - 24% of the time  Memory Memory assist level: Recognizes or recalls 75 - 89% of the time/requires cueing 10 - 24% of the time    Pain Pain Assessment Pain Scale: 0-10 Pain Score: 5  Pain Location: Head Pain Intervention(s): Other (Comment)(RN aware)  Therapy/Group: Individual Therapy  Barry Miranda A Barry Miranda 04/06/2018, 12:24 PM

## 2018-04-06 NOTE — Progress Notes (Signed)
Occupational Therapy Session Note  Patient Details  Name: Barry Miranda MRN: [id number] Date of Birth: [birth year]  Today's Date: 04/06/2018 OT Group Time: 1100-1200 OT Group Time Calculation (min): 60 min  Skilled Therapeutic Interventions/Progress Updates:    Pt participated in therapeutic w/c level dance group with focus on UE/LE strengthening, activity tolerance, and social participation for carryover during self care tasks. Pt was guided through various dance-based exercises involving UB/LB and trunk. Modifications made to incorporate Lt attention/NMR and standing balance. He stood 50% of the time, able to dance dynamically with simultaneous UE/LE involvement and steady assist. Actively requesting songs and engaging with other group members. At end of session he was returned to room and left with all needs within reach.     Therapy Documentation Precautions:  Precautions Precautions: Fall, Other (comment) Precaution Comments: R craniectomy with bone flap removed Restrictions Weight Bearing Restrictions: No  ADL:      See Function Navigator for Current Functional Status.   Therapy/Group: Group Therapy  Meliya Mcconahy A Dalma Panchal 04/06/2018, 12:35 PM

## 2018-04-06 NOTE — Progress Notes (Signed)
[facility] PHYSICAL MEDICINE & REHABILITATION     PROGRESS NOTE    Subjective/Complaints: Patient seen lying in bed this morning. Wife at bedside. Patient states he slept well overnight. Wife has questions regarding dental evaluation due to prior need for root canal. She also has questions regarding blister on neck at central line site.  ROS: + dental pain. Denies CP, SOB, N/V/D  Objective:  No results found. Recent Labs    04/05/18 0506  WBC 10.9*  HGB 9.0*  HCT 26.6*  PLT 319   Recent Labs    04/04/18 1244 04/05/18 0506  NA 143 142  K  --  3.2*  CL  --  105  GLUCOSE  --  106*  BUN  --  7  CREATININE  --  0.60*  CALCIUM  --  8.1*   CBG (last 3)  No results for input(s): GLUCAP in the last 72 hours.  Wt Readings from Last 3 Encounters:  04/04/18 69.5 kg (153 lb 3.5 oz)  03/24/18 69.5 kg (153 lb 3.5 oz)  10/17/11 71.5 kg (157 lb 9.6 oz)     Intake/Output Summary (Last 24 hours) at 04/06/2018 0716 Last data filed at 04/06/2018 0700 Gross per 24 hour  Intake 480 ml  Output 1750 ml  Net -1270 ml    Vital Signs: Blood pressure (!) 141/85, pulse 68, temperature 98 F (36.7 C), temperature source Oral, resp. rate 18, height 5\' 4"  (1.626 m), weight 69.5 kg (153 lb 3.5 oz), SpO2 100 %. Physical Exam:  Constitutional: No distress . Vital signs reviewed. HENT: Normocephalic.  Atraumatic. Poor dentition Eyes: EOMI. No discharge. Cardiovascular: RRR. No JVD. Respiratory: CTA Bilaterally. Normal effort. GI: BS +. Non-distended. Musc: No edema or tenderness in extremities. Neurological: He isalert. Decreased insight and awareness.  Motor: LUE/LLE: 4/5 proximal to distal RUE/RLE 5/5 proximal to distal Skin: Skin iswarmand dry. One small blister right neck with one heel blister. Psychiatric:Flat   Assessment/Plan: 1. Functional deficits secondary to right SDH which require 3+ hours per day of interdisciplinary therapy in a comprehensive inpatient rehab  setting. Physiatrist is providing close team supervision and 24 hour management of active medical problems listed below. Physiatrist and rehab team continue to assess barriers to discharge/monitor patient progress toward functional and medical goals.  Function:  Bathing Bathing position      Bathing parts      Bathing assist        Upper Body Dressing/Undressing Upper body dressing                    Upper body assist        Lower Body Dressing/Undressing Lower body dressing                                  Lower body assist        Toileting Toileting   Toileting steps completed by patient: Adjust clothing prior to toileting, Performs perineal hygiene, Adjust clothing after toileting      Toileting assist Assist level: Touching or steadying assistance (Pt.75%)   Transfers Chair/bed transfer   Chair/bed transfer method: Stand pivot Chair/bed transfer assist level: Touching or steadying assistance (Pt > 75%)       Locomotion Ambulation     Max distance: 176ft  Assist level: Touching or steadying assistance (Pt > 75%)   Wheelchair     Max wheelchair distance: 133ft  Assist Level:  Touching or steadying assistance (Pt > 75%)  Cognition Comprehension Comprehension assist level: Understands complex 90% of the time/cues 10% of the time  Expression Expression assist level: Expresses basic 90% of the time/requires cueing < 10% of the time.  Social Interaction Social Interaction assist level: Interacts appropriately 75 - 89% of the time - Needs redirection for appropriate language or to initiate interaction.  Problem Solving Problem solving assist level: Solves basic 75 - 89% of the time/requires cueing 10 - 24% of the time  Memory Memory assist level: Recognizes or recalls 75 - 89% of the time/requires cueing 10 - 24% of the time   Medical Problem List and Plan: 1.Decreased functional mobility with apraxiasecondary to right SDH/EDHwith initial  craniotomy evacuation hematoma 8/63/8177 complicated by epidural hematoma with evacuation 03/25/2018 and follow-up decompressive right frontal parietal craniectomy implantation of bone flap 03/28/2018.   Continue CIR -helmet ordered for cranial protection 2. DVT Prophylaxis/Anticoagulation: SDH. Monitor for any signs of DVT 3. Pain Management:Hydrocodone and Fioricet as needed.  -added low dose topamax 25mg  qhs 4. Mood:Provide emotional support 5. Neuropsych: This patientisnot fully capable of making decisions on hisown behalf. 6. Skin/Wound Care:Routine skin checks 7. Fluids/Electrolytes/Nutrition:encourage PO 8.Seizure prophylaxis. Keppra 1000 mg every 12 hours as his seizure risk is high 9.Hyperlipidemia. Zocor 10.Constipation. Laxative assistance 11.? Reactive hypertension  Labile on 6/29 12. Hypokalemia  Potassium 3.2 on 6/28  Supplement at 2 days on 6/29 13.Hypoalbuminemia  Supplement initiated on 6/29 14. Leukocytosis  WBCs 10.9 on 6/28  Continue to monitor  Afebrile 15. Acute blood loss anemia  Hemoccult 9.0 and 6/20  Continue to monitor 16. Dental Carrie  Oral gel ordered on 6/29    LOS (Days) 2 A FACE TO FACE EVALUATION WAS PERFORMED  Ankit Lorie Phenix, MD 04/06/2018 7:16 AM

## 2018-04-06 NOTE — Progress Notes (Signed)
Physical Therapy Session Note  Patient Details  Name: Barry Miranda MRN: [id number] Date of Birth: 01-Jan-1961  Today's Date: 04/06/2018 PT Individual Time: 1300-1400 PT Individual Time Calculation (min): 60 min   Short Term Goals: Week 1:  PT Short Term Goal 1 (Week 1): STG =LTG due to ELOS  Skilled Therapeutic Interventions/Progress Updates:    Pt received sidelying in bed, agreeable to PT. Pt reports 2/10 headache and that he just received pain medication from RN. Pt requests to shower. Standing balance with CGA to min A while doffing clothing independently. Pt initiates shower in seated position on shower chair and progresses to standing with CGA while bathing himself with washcloth and hand-held shower head. Pt is able to position LE on shower chair and maintain standing balance while leaning over with min A to better reach LE for cleaning. Standing balance with CGA while drying off and donning clothes independently. Ambulation x 200 ft with no AD and CGA progressing to SBA. Ascend/descend 12 stairs with 2 handrails and min A, step-through gait pattern. Standing balance: sidesteps with no UE support and CGA, tandem amb with hand-held assist and CGA. Ambulation through cluttered environment with obstacles to navigate with CGA. Toilet transfer with SBA. Pt left supine in bed with needs in reach, chair alarm in place.  Therapy Documentation Precautions:  Precautions Precautions: Fall, Other (comment) Precaution Comments: R craniectomy with bone flap removed Restrictions Weight Bearing Restrictions: No  See Function Navigator for Current Functional Status.   Therapy/Group: Individual Therapy  Excell Seltzer, PT, DPT  04/06/2018, 2:46 PM

## 2018-04-07 ENCOUNTER — Inpatient Hospital Stay (HOSPITAL_COMMUNITY): Payer: 59

## 2018-04-07 ENCOUNTER — Inpatient Hospital Stay (HOSPITAL_COMMUNITY): Payer: 59 | Admitting: Physical Therapy

## 2018-04-07 DIAGNOSIS — R0989 Other specified symptoms and signs involving the circulatory and respiratory systems: Secondary | ICD-10-CM

## 2018-04-07 NOTE — Progress Notes (Signed)
Occupational Therapy Session Note  Patient Details  Name: Barry Miranda MRN: [id number] Date of Birth: August 12, 1961  Today's Date: 04/07/2018 OT Individual Time: 1700-1749 OT Individual Time Calculation (min): 45 min    Short Term Goals: Week 1:  OT Short Term Goal 1 (Week 1): STG=LTG d/t ELOS  Skilled Therapeutic Interventions/Progress Updates:    1:1. Pt reporting HA at end of session. RN alerted. Pt carries breakfast tray in LUE and ambulates with supervision to dining room table in day room. Pt requires cueing for grasp on knife to use in LUE when cutting up pancakes for NMR. However pt primarily uses chop sticks at home. Pt eats seated in standard chair with supervision and cueing to use LUE for opening packages and containers. Pt abmulates to dayroom and OT educates on fall recovery. Pt return demonstrates getting up from floor with mat underneath body for cushion with supervision. Pt scans gym for 12 small objects retrieving with increased time and mod cueing for efficient scanning strategy to locate 12/12 items. Eixted session with pt seated in w/c, call light in reach and all needs met  Therapy Documentation Precautions:  Precautions Precautions: Fall, Other (comment) Precaution Comments: R craniectomy with bone flap removed Restrictions Weight Bearing Restrictions: No General:   Vital Signs: Therapy Vitals Temp: (!) 97.5 F (36.4 C) Temp Source: Oral Pulse Rate: 70 Resp: 16 BP: 125/77 Patient Position (if appropriate): Lying Oxygen Therapy SpO2: 99 % O2 Device: Room Air Pain: Pain Assessment Pain Scale: 0-10 Pain Score: 5  Pain Location: Head Pain Orientation: Right Pain Descriptors / Indicators: Aching Pain Frequency: Intermittent Pain Onset: On-going Patients Stated Pain Goal: 0 Pain Intervention(s): Medication (See eMAR)  See Function Navigator for Current Functional Status.   Therapy/Group: Individual Therapy  [name] 04/07/2018, 7:14  AM

## 2018-04-07 NOTE — Progress Notes (Signed)
RN called to check pt.'s honeycomb dressing to RLQ. Per patient, he had a bath during the day and felt that his dressing got saturated. He complained that the saturated dressing made his skin feel itchy and that he was uncomfortable with it. RN called on call MD, Dr. Posey Pronto. Order received to change honeycomb dressing once.

## 2018-04-07 NOTE — Progress Notes (Signed)
Physical Therapy Session Note  Patient Details  Name: Barry Miranda MRN: [id number] Date of Birth: [birth year]  Today's Date: 04/07/2018 PT Individual Time: 1120-1205 PT Individual Time Calculation (min): 45 min   Short Term Goals: Week 1:  PT Short Term Goal 1 (Week 1): STG =LTG due to ELOS  Skilled Therapeutic Interventions/Progress Updates:  Pt was seen bedside in the am. Pt transferred to edge of bed with S. Pt performed multiple sit to stand transfers with S and sit to stand transfers with min guard. Pt ambulated 150 and 200 feet without assistive device and min guard. Treatment in gym focused on NMR, utilizing cone taps, alternating cone taps and criss cross cone taps, 3 sets x 10 reps each. Pt side step 75 feet x 2 R and L and ambulated 150 feet backwards x 2. Pt left in dayroom with family eating lunch, pt's nurse aware.   Therapy Documentation Precautions:  Precautions Precautions: Fall, Other (comment) Precaution Comments: R craniectomy with bone flap removed Restrictions Weight Bearing Restrictions: No General:   Pain: Pt c/o 7/10 headache.   See Function Navigator for Current Functional Status.   Therapy/Group: Individual Therapy  Dub Amis 04/07/2018, 12:47 PM

## 2018-04-07 NOTE — Progress Notes (Signed)
[facility] PHYSICAL MEDICINE & REHABILITATION     PROGRESS NOTE    Subjective/Complaints: Patient seen lying in bed this morning. He states he slept well overnight. Wife at bedside. Overnight, and pulmonary nursing regarding saturated dressing and patient discomfort. Dressing changed. Patient states he feels better after dressing change. His questions regarding plans for surgery.  ROS: denies CP, SOB, N/V/D  Objective:  No results found. Recent Labs    04/05/18 0506  WBC 10.9*  HGB 9.0*  HCT 26.6*  PLT 319   Recent Labs    04/04/18 1244 04/05/18 0506  NA 143 142  K  --  3.2*  CL  --  105  GLUCOSE  --  106*  BUN  --  7  CREATININE  --  0.60*  CALCIUM  --  8.1*   CBG (last 3)  No results for input(s): GLUCAP in the last 72 hours.  Wt Readings from Last 3 Encounters:  04/04/18 69.5 kg (153 lb 3.5 oz)  03/24/18 69.5 kg (153 lb 3.5 oz)  10/17/11 71.5 kg (157 lb 9.6 oz)     Intake/Output Summary (Last 24 hours) at 04/07/2018 0735 Last data filed at 04/06/2018 1700 Gross per 24 hour  Intake 960 ml  Output -  Net 960 ml    Vital Signs: Blood pressure 125/77, pulse 70, temperature (!) 97.5 F (36.4 C), temperature source Oral, resp. rate 16, height 5\' 4"  (1.626 m), weight 69.5 kg (153 lb 3.5 oz), SpO2 99 %. Physical Exam:  Constitutional: No distress . Vital signs reviewed. HENT: Normocephalic.  Atraumatic. Poor dentition Eyes: EOMI. No discharge. Cardiovascular: RRR. No JVD. Respiratory: CTA Bilaterally. Normal effort. GI: BS +. Non-distended. Musc: No edema or tenderness in extremities. Neurological: He isalert. Decreased insight and awareness.  Motor: LUE/LLE: 4/5 proximal to distal (stable) RUE/RLE 5/5 proximal to distal Skin: Skin iswarmand dry. One small blister right neck with one heel blister. Psychiatric:Flat   Assessment/Plan: 1. Functional deficits secondary to right SDH which require 3+ hours per day of interdisciplinary therapy in a  comprehensive inpatient rehab setting. Physiatrist is providing close team supervision and 24 hour management of active medical problems listed below. Physiatrist and rehab team continue to assess barriers to discharge/monitor patient progress toward functional and medical goals.  Function:  Bathing Bathing position      Bathing parts      Bathing assist        Upper Body Dressing/Undressing Upper body dressing                    Upper body assist        Lower Body Dressing/Undressing Lower body dressing                                  Lower body assist        Toileting Toileting   Toileting steps completed by patient: Adjust clothing prior to toileting, Performs perineal hygiene, Adjust clothing after toileting      Toileting assist Assist level: Touching or steadying assistance (Pt.75%)   Transfers Chair/bed transfer   Chair/bed transfer method: Ambulatory Chair/bed transfer assist level: Touching or steadying assistance (Pt > 75%)       Locomotion Ambulation     Max distance: 200' Assist level: Touching or steadying assistance (Pt > 75%)   Wheelchair     Max wheelchair distance: 128ft  Assist Level: Touching or steadying assistance (  Pt > 75%)  Cognition Comprehension Comprehension assist level: Understands complex 90% of the time/cues 10% of the time  Expression Expression assist level: Expresses basic 90% of the time/requires cueing < 10% of the time.  Social Interaction Social Interaction assist level: Interacts appropriately 75 - 89% of the time - Needs redirection for appropriate language or to initiate interaction.  Problem Solving Problem solving assist level: Solves basic 75 - 89% of the time/requires cueing 10 - 24% of the time  Memory Memory assist level: Recognizes or recalls 75 - 89% of the time/requires cueing 10 - 24% of the time   Medical Problem List and Plan: 1.Decreased functional mobility with apraxiasecondary to  right SDH/EDHwith initial craniotomy evacuation hematoma 7/62/8315 complicated by epidural hematoma with evacuation 03/25/2018 and follow-up decompressive right frontal parietal craniectomy implantation of bone flap 03/28/2018.   Continue CIR -helmet ordered for cranial protection 2. DVT Prophylaxis/Anticoagulation: SDH. Monitor for any signs of DVT 3. Pain Management:Hydrocodone and Fioricet as needed.  -added low dose topamax 25mg  qhs 4. Mood:Provide emotional support 5. Neuropsych: This patientisnot fully capable of making decisions on hisown behalf. 6. Skin/Wound Care:Routine skin checks 7. Fluids/Electrolytes/Nutrition:encourage PO 8.Seizure prophylaxis. Keppra 1000 mg every 12 hours as his seizure risk is high 9.Hyperlipidemia. Zocor 10.Constipation. Laxative assistance 11.? Reactive hypertension  Slightly labile on 6/30 12. Hypokalemia  Potassium 3.2 on 6/28  Supplement at 2 days on 6/29  Labs ordered for tomorrow 13.Hypoalbuminemia  Supplement initiated on 6/29 14. Leukocytosis  WBCs 10.9 on 6/28  Continue to monitor  Afebrile 15. Acute blood loss anemia  Hemoccult 9.0 and 6/20  Continue to monitor 16. Dental Carries  Oral gel ordered on 6/29  LOS (Days) 3 A FACE TO FACE EVALUATION WAS PERFORMED  Catalaya Garr Lorie Phenix, MD 04/07/2018 7:35 AM

## 2018-04-08 ENCOUNTER — Inpatient Hospital Stay (HOSPITAL_COMMUNITY): Payer: 59 | Admitting: Speech Pathology

## 2018-04-08 ENCOUNTER — Inpatient Hospital Stay (HOSPITAL_COMMUNITY): Payer: 59

## 2018-04-08 ENCOUNTER — Inpatient Hospital Stay (HOSPITAL_COMMUNITY): Payer: 59 | Admitting: Occupational Therapy

## 2018-04-08 ENCOUNTER — Encounter (HOSPITAL_COMMUNITY): Payer: 59 | Admitting: Psychology

## 2018-04-08 DIAGNOSIS — R739 Hyperglycemia, unspecified: Secondary | ICD-10-CM

## 2018-04-08 DIAGNOSIS — G441 Vascular headache, not elsewhere classified: Secondary | ICD-10-CM

## 2018-04-08 DIAGNOSIS — R41844 Frontal lobe and executive function deficit: Secondary | ICD-10-CM

## 2018-04-08 LAB — BASIC METABOLIC PANEL
Anion gap: 7 (ref 5–15)
BUN: 16 mg/dL (ref 6–20)
CALCIUM: 8.6 mg/dL — AB (ref 8.9–10.3)
CO2: 26 mmol/L (ref 22–32)
Chloride: 105 mmol/L (ref 98–111)
Creatinine, Ser: 0.74 mg/dL (ref 0.61–1.24)
GLUCOSE: 108 mg/dL — AB (ref 70–99)
POTASSIUM: 4.4 mmol/L (ref 3.5–5.1)
Sodium: 138 mmol/L (ref 135–145)

## 2018-04-08 LAB — CBC WITH DIFFERENTIAL/PLATELET
ABS IMMATURE GRANULOCYTES: 0.1 10*3/uL (ref 0.0–0.1)
BASOS ABS: 0.1 10*3/uL (ref 0.0–0.1)
BASOS PCT: 1 %
Eosinophils Absolute: 0.3 10*3/uL (ref 0.0–0.7)
Eosinophils Relative: 4 %
HCT: 31.7 % — ABNORMAL LOW (ref 39.0–52.0)
Hemoglobin: 10.2 g/dL — ABNORMAL LOW (ref 13.0–17.0)
IMMATURE GRANULOCYTES: 2 %
Lymphocytes Relative: 21 %
Lymphs Abs: 1.8 10*3/uL (ref 0.7–4.0)
MCH: 30.8 pg (ref 26.0–34.0)
MCHC: 32.2 g/dL (ref 30.0–36.0)
MCV: 95.8 fL (ref 78.0–100.0)
Monocytes Absolute: 0.8 10*3/uL (ref 0.1–1.0)
Monocytes Relative: 9 %
NEUTROS PCT: 65 %
Neutro Abs: 5.7 10*3/uL (ref 1.7–7.7)
Platelets: 391 10*3/uL (ref 150–400)
RBC: 3.31 MIL/uL — ABNORMAL LOW (ref 4.22–5.81)
RDW: 15.1 % (ref 11.5–15.5)
WBC: 8.9 10*3/uL (ref 4.0–10.5)

## 2018-04-08 MED ORDER — TOPIRAMATE 25 MG PO TABS
25.0000 mg | ORAL_TABLET | Freq: Two times a day (BID) | ORAL | Status: DC
  Administered 2018-04-08 – 2018-04-12 (×8): 25 mg via ORAL
  Filled 2018-04-08 (×8): qty 1

## 2018-04-08 NOTE — Progress Notes (Addendum)
Speech Language Pathology Daily Session Note  Patient Details  Name: Barry Miranda MRN: [id number] Date of Birth: May 31, 1961  Today's Date: 04/08/2018 SLP Individual Time: 0800-0830 SLP Individual Time Calculation (min): 30 min  Short Term Goals: Week 1: SLP Short Term Goal 1 (Week 1): Pt will utilize external memory aides to recall new information with Max A cues.  SLP Short Term Goal 1 - Progress (Week 1): Revised due to lack of progress SLP Short Term Goal 2 (Week 1): Pt will demonstrate intellectual awareness identifying 2 physical and 2 cognitive deficits related to BI with Max A cues.  SLP Short Term Goal 2 - Progress (Week 1): Revised due to lack of progress SLP Short Term Goal 3 (Week 1): Pt will scan to left of midline to locate objects with Mod A cues.  SLP Short Term Goal 4 (Week 1): Pt will complete basic problem solving tasks with Max A cues.  SLP Short Term Goal 4 - Progress (Week 1): Revised due to lack of progress SLP Short Term Goal 5 (Week 1): Pt will demonstrate sustained attention for ~ 5 minutes with Max A.  SLP Short Term Goal 5 - Progress (Week 1): Revised due to lack of progress  Skilled Therapeutic Interventions:Skilled treatment session focused on cognition goals. SLP facilitated session by providing Max A cues (consisting of multiple choices, semantic descriptions and leading questions) to recall orientation information regarding date and place. Pt required Max A for sustained attention in quiet environment for ~ 15 minutes. He also required heavy leading questions to identify current physical and cognitive deficits related to BI. Pt with no safety awareness. He frequently closed eyes during session but remained alert. In addition, he demonstrates delayed responses of > 5 seconds per question. Pt required extensive cognitive therapy. Pt was left upright in bed, bed alarm on, all four bedrails up and all needs within reach. Pt required Max A review of call bell and which  button to press. Education provided to nursing on severity of cognitive deficits.      Function:  Eating Eating                 Cognition Comprehension Comprehension assist level: Understands basic 75 - 89% of the time/ requires cueing 10 - 24% of the time  Expression   Expression assist level: Expresses basic 90% of the time/requires cueing < 10% of the time.  Social Interaction Social Interaction assist level: Interacts appropriately 50 - 74% of the time - May be physically or verbally inappropriate.;Interacts appropriately 25 - 49% of time - Needs frequent redirection.  Problem Solving Problem solving assist level: Solves basic 25 - 49% of the time - needs direction more than half the time to initiate, plan or complete simple activities  Memory Memory assist level: Recognizes or recalls less than 25% of the time/requires cueing greater than 75% of the time    Pain Pain Assessment Pain Scale: 0-10 Pain Score: 6  Pain Type: Acute pain Pain Location: Head Pain Orientation: Right;Anterior Pain Descriptors / Indicators: Aching Pain Frequency: Intermittent Patients Stated Pain Goal: 0 Pain Intervention(s): Medication (See eMAR)  Therapy/Group: Individual Therapy  Barry Miranda 04/08/2018, 9:46 AM

## 2018-04-08 NOTE — Progress Notes (Signed)
Physical Therapy Session Note  Patient Details  Name: Barry Miranda MRN: [id number] Date of Birth: 1961-04-14  Today's Date: 04/08/2018 PT Individual Time: 1030-1100, 7948-0165 PT Individual Time Calculation (min): 30 min and 30 min   Short Term Goals: Week 1:  PT Short Term Goal 1 (Week 1): STG =LTG due to ELOS  Skilled Therapeutic Interventions/Progress Updates:    Session 1: Pt supine in bed upon PT arrival, agreeable to therapy tx and reports headache 4/10, just received pain medicine from RN. Pt transferred to sitting EOB with supervision and ambulated to the gym x 150 ft with CGA. Pt worked on dynamic standing balance this session without UE support to include toe taps on 6inch step to numbered targets. Pt participated in Functional Gait Assessment including items walking with changes in gait speed, walking with head turns, walking backwards, stepping over object, tandem walking, walking with eyes closed, and stairs. Pt scored 25/30 on the FGA, discussed results with the pt.  Pt ambulated back to room with CGA and left seated in w/c with needs in reach and QRB in place, chair alarm set.   Session 2: Pt supine in bed upon PT arrival, agreeable to therapy tx and denies pain. Pt transferred to sitting EOB with supervision and ambulated to the gym x 150 ft with SBA. Pt ambulated throughout unit this session in bouts >200 ft with SBA. Pt performed car transfer with SBA and ambulated up ramp, across unlevel surface and down a curb all with SBA. Pt used biodex this session without UE support for limits of stability training, x 2 trials. Pt worked on dual-task ambulating while counting backwards from 10, sidestepping while counting backwards from 10, attempted to count backwards from 100 by 5's (too difficult). Pt worked on dual-task to name objects in the hallway of the color red while ambulating. Pt ambulated back to room and left supine in bed with needs in reach, bed alarm set and daughter present.    Therapy Documentation Precautions:  Precautions Precautions: Fall, Other (comment) Precaution Comments: R craniectomy with bone flap removed Restrictions Weight Bearing Restrictions: No   See Function Navigator for Current Functional Status.   Therapy/Group: Individual Therapy  Netta Corrigan, PT, DPT 04/08/2018, 7:55 AM

## 2018-04-08 NOTE — Progress Notes (Addendum)
Occupational Therapy Session Note  Patient Details  Name: Barry Miranda MRN: [id number] Date of Birth: Feb 28, 1961  Today's Date: 04/08/2018 OT Individual Time: 3299-2426 OT Individual Time Calculation (min): 87 min   Skilled Therapeutic Interventions/Progress Updates:    Pt greeted in dayroom with dtr present. ADL needs met and no c/o pain. Worked on cognitive remediation, Lt attention, functional ambulation, and dynamic balance via pathfinding way outdoors and then back to unit. Pt required mod-max vcs for recall of visual aides we had passed and interpretation of hallway signs (especially on Lt side). He ambulated +1000 ft in total, including a lap around the fountain outside. When he returned to unit, focused on Lt NMR/sensation normalization in dayroom via stereognosis activity. He exhibited increased difficultly with identifying smaller items with vision occluded. Question if this was due to tactile discrimination deficits vs. culture (puzzle piece and checker piece). At end of tx he opted to remain in dayroom with dtr present. Safety belt fastened at time of departure.   Therapy Documentation Precautions:  Precautions Precautions: Fall, Other (comment) Precaution Comments: R craniectomy with bone flap removed Restrictions Weight Bearing Restrictions: No Vital Signs: Therapy Vitals Temp: 98 F (36.7 C) Temp Source: Oral Pulse Rate: 72 Resp: 18 BP: 134/73 Patient Position (if appropriate): Sitting Oxygen Therapy SpO2: 100 % O2 Device: Room Air ADL:      See Function Navigator for Current Functional Status.   Therapy/Group: Individual Therapy  [facility] Emmalynne Courtney 04/08/2018, 3:50 PM

## 2018-04-08 NOTE — Consult Note (Signed)
Neuropsychological Consultation   Patient:   Barry Miranda   DOB:   1961-02-22  MR Number:  [id number]  Location:  [facility] [address] [id number] [place] [address] Dept: [phone] QQV: [phone]           Date of Service:   04/08/2018  Start Time:   9:30 End Time:   10:30  Provider/Observer:  Ilean Skill, Psy.D.       Clinical Neuropsychologist       Billing Code/Service: [id number] 4 Units  Chief Complaint:    Todrick Siedschlag is a 57 year old male with history of hyperlipidemia and tobacco abuse.  Presented on 03/24/2018 with headache over prior days and associated nausea and vomiting as well as photophobia.  Denied any recall of trauma or falls.  CT showed right SDH.  Initially 1 cm right to left subfalcine herniation with left lateral ventricle entrapment.  Initial craniotomy evacuation of hematoma on 03/24/2018.  Follow-up CT showed acute epidural hematoma and underwent right frontal temporal craniotomy evacuation on 03/25/2018.  36 hours after latest surgery displayed progressive decline with waxing and waning course.  Showed 12 mm right subdural hematoma with 11 mm midline shift with patient returning to OR for decompressive right frontal parietal craniectomy.  Patient admitted to comprehensive rehab program.  Patient has continued with improved mobility and strength.  However, the patient has continued with significant cognitive deficits.  Slowed information processing speed, visual inattention, executive functioning deficits, and poor mental status have all been noted.  Reason for Service:  OAC:ZYSA Nguyenis a 57 year old right-handed male with history of hyperlipidemia and tobacco abuse. Per chart review and wife patient independent prior to admission living with spouse. He is an Conservation officer, nature. Two-level home with bed and bath upstairs. Presented 03/24/2018 with headache times several days  with associated nausea and vomiting as well as photophobia. Denied any trauma or falls. Cranial CT scan reviewed, showing right SDH. Per report, acute 1 cm right holohemispheric subdural hematoma resulting in a 1 cm right to left subfalcine herniation with left lateral ventricle entrapment. Underwent craniotomy evacuation of hematoma 03/24/2018 per Dr. Vertell Limber. Postoperative lethargy with follow-up CT demonstrating presence of acute epidural hematoma in the region of previous subdural hematoma and underwent right frontal temporal craniotomy evacuation 03/25/2018 per Dr. Ellene Route. Patient remained intubated for a short time. Patient did well until approximately 36 hours after latest surgery with progressive decline somewhat waxing and waning course. Repeat scan demonstrated continued approximately 12 mm right subdural hematoma and 11 mm midline shift. There was also effacement of the basal cisterns. Patient again returned to the OR underwent decompressive right frontal parietal craniectomy implantation of bone flap and subcutaneous pocket 03/28/2018 per Dr. Kathyrn Sheriff. Maintained on Keppra for seizure prophylaxis with EEG completed 03/28/2018 consistent with generalized and focal left cerebral dysfunction no seizure activity .He was ultimately extubated 03/29/2018.Patient had remained on 3% normal saline through 04/03/2018 and stopped. Physical and occupational therapy evaluations completed and ongoing with recommendations of physical medicine rehab consult. Patient was admitted for a comprehensive rehab program.    Current Status:  Today, the patient showed difficulty with mental status related to time and situation/location.  The patient was unable to describe what it happened to him beyond events prior to presenting to the hospital.  The patient did deny any falls or trauma to the head that he could recall.  The patient's speech was clear and understandable but very  slow in response times.  The patient  had little recall about the events during his hospitalization and over the past several days.  The patient's executive functioning was clearly impaired.  The patient struggled describing where he was but was able to pice together that he was in the hospital.  Asked if he could go home soon due to holiday.  He reports he knows that July 4th is coming up due to plans he had made prior to his SDH.  Clear slowing in information processing speed, poor awareness, visual neglect all noted.  Patient denies depressed mood or symptoms of anxiety.    Behavioral Observation: [name]  presents as a 57 y.o.-year-old Right Asian Male who appeared his stated age. his dress was Appropriate and he was Well Groomed and his manners were Appropriate to the situation.  his participation was indicative of Appropriate and Inattentive behaviors.  There were any physical disabilities noted.  he displayed an appropriate level of cooperation and motivation.     Interactions:    Active Appropriate, Inattentive and Redirectable  Attention:   abnormal and attention span appeared shorter than expected for age  Memory:   abnormal; remote memory intact, recent memory impaired  Visuo-spatial:  abnormal visual inattention  Speech (Volume):  low  Speech:   normal; slowed  Thought Process:  Coherent  Though Content:  WNL; not suicidal and not homicidal  Orientation:   person  Judgment:   Poor  Planning:   Poor  Affect:    Flat  Mood:    Dysphoric  Insight:   Lacking  Intelligence:   high   Medical History:   Past Medical History:  Diagnosis Date  . Hyperlipidemia    Psychiatric History:  Patient denies any prior psychiatric history  Family Med/Psych History:  Family History  Problem Relation Age of Onset  . Hypertension Mother   . Diabetes Mother     Risk of Suicide/Violence: virtually non-existent   Impression/DX:  [name] is a 57 year old male with history of hyperlipidemia and tobacco  abuse.  Presented on 03/24/2018 with headache over prior days and associated nausea and vomiting as well as photophobia.  Denied any recall of trauma or falls.  CT showed right SDH.  Initially 1 cm right to left subfalcine herniation with left lateral ventricle entrapment.  Initial craniotomy evacuation of hematoma on 03/24/2018.  Follow-up CT showed acute epidural hematoma and underwent right frontal temporal craniotomy evacuation on 03/25/2018.  36 hours after latest surgery displayed progressive decline with waxing and waning course.  Showed 12 mm right subdural hematoma with 11 mm midline shift with patient returning to OR for decompressive right frontal parietal craniectomy.  Patient admitted to comprehensive rehab program.  Patient has continued with improved mobility and strength.  However, the patient has continued with significant cognitive deficits.  Slowed information processing speed, visual inattention, executive functioning deficits, and poor mental status have all been noted.  Today, the patient showed difficulty with mental status related to time and situation/location.  The patient was unable to describe what it happened to him beyond events prior to presenting to the hospital.  The patient did deny any falls or trauma to the head that he could recall.  The patient's speech was clear and understandable but very slow in response times.  The patient had little recall about the events during his hospitalization and over the past several days.  The patient's executive functioning was clearly impaired.  The patient struggled  describing where he was but was able to pice together that he was in the hospital.  Asked if he could go home soon due to holiday.  He reports he knows that July 4th is coming up due to plans he had made prior to his SDH.  Clear slowing in information processing speed, poor awareness, visual neglect all noted.  Patient denies depressed mood or symptoms of anxiety.     Disposition/Plan:  The patient is improving motor function, but continues with significant cognitive deficits.  Slowed information processing speed, impaired executive functioning, poor awareness, poor mental status all noted.  Is improving in cognitive functioning but significant deficits continue.  The patient will likely need more time for awareness, processing speed and executive functioning to improve.  Safety issues persist due to level of cognitive deficits noted.  Patient does not display impulsive control issues but mental status and self awareness remain impaired.  The patient will need more time with speech therapy to work on cognitive deficits.  Will see the patient again later this week.    Diagnosis:    SDH, Executive Function Physicist, medical   _______________________ Ilean Skill, Psy.D.

## 2018-04-08 NOTE — Plan of Care (Signed)
  Problem: RH SAFETY Goal: RH STG ADHERE TO SAFETY PRECAUTIONS W/ASSISTANCE/DEVICE Description STG Adhere to Safety Precautions With Assistance/Device.mod assist  Outcome: Not Progressing Note:  Patient is still showing signs of impulsiveness and not waiting for help before getting up. Although patient will agree to ask for help before getting up, he is still not adhering to this request.

## 2018-04-08 NOTE — Progress Notes (Signed)
[facility] PHYSICAL MEDICINE & REHABILITATION     PROGRESS NOTE    Subjective/Complaints: Patient seen sitting up in bed this morning. He states he slept fairly overnight due to headache. He notes improvement in his pain.  ROS: + headache. Denies CP, SOB, N/V/D  Objective:  No results found. Recent Labs    04/08/18 0635  WBC 8.9  HGB 10.2*  HCT 31.7*  PLT 391   Recent Labs    04/08/18 0635  NA 138  K 4.4  CL 105  GLUCOSE 108*  BUN 16  CREATININE 0.74  CALCIUM 8.6*   CBG (last 3)  No results for input(s): GLUCAP in the last 72 hours.  Wt Readings from Last 3 Encounters:  04/04/18 69.5 kg (153 lb 3.5 oz)  03/24/18 69.5 kg (153 lb 3.5 oz)  10/17/11 71.5 kg (157 lb 9.6 oz)     Intake/Output Summary (Last 24 hours) at 04/08/2018 0905 Last data filed at 04/08/2018 0800 Gross per 24 hour  Intake 720 ml  Output 1 ml  Net 719 ml    Vital Signs: Blood pressure 129/77, pulse 78, temperature 98 F (36.7 C), temperature source Oral, resp. rate 16, height 5\' 4"  (1.626 m), weight 69.5 kg (153 lb 3.5 oz), SpO2 100 %. Physical Exam:  Constitutional: No distress . Vital signs reviewed. HENT: Normocephalic.  Atraumatic. Poor dentition Eyes: EOMI. No discharge. Cardiovascular: RRR. No JVD. Respiratory: CTA Bilaterally. Normal effort. GI: BS +. Non-distended. Musc: No edema or tenderness in extremities. Neurological: He isalert. Decreased insight and awareness.  Motor: LUE/LLE: 4/5 proximal to distal (unchanged) RUE/RLE 5/5 proximal to distal Skin: Skin iswarmand dry. 2 small blisters on right neck stable. Psychiatric:Flat   Assessment/Plan: 1. Functional deficits secondary to right SDH which require 3+ hours per day of interdisciplinary therapy in a comprehensive inpatient rehab setting. Physiatrist is providing close team supervision and 24 hour management of active medical problems listed below. Physiatrist and rehab team continue to assess barriers to  discharge/monitor patient progress toward functional and medical goals.  Function:  Bathing Bathing position      Bathing parts      Bathing assist        Upper Body Dressing/Undressing Upper body dressing                    Upper body assist        Lower Body Dressing/Undressing Lower body dressing                                  Lower body assist        Toileting Toileting   Toileting steps completed by patient: Adjust clothing prior to toileting, Performs perineal hygiene, Adjust clothing after toileting      Toileting assist Assist level: Touching or steadying assistance (Pt.75%)   Transfers Chair/bed transfer   Chair/bed transfer method: Ambulatory Chair/bed transfer assist level: Touching or steadying assistance (Pt > 75%)       Locomotion Ambulation     Max distance: 200 Assist level: Touching or steadying assistance (Pt > 75%)   Wheelchair     Max wheelchair distance: 159ft  Assist Level: Touching or steadying assistance (Pt > 75%)  Cognition Comprehension Comprehension assist level: Understands basic 90% of the time/cues < 10% of the time  Expression Expression assist level: Expresses basic 90% of the time/requires cueing < 10% of the time.  Social Interaction  Social Interaction assist level: Interacts appropriately 75 - 89% of the time - Needs redirection for appropriate language or to initiate interaction.  Problem Solving Problem solving assist level: Solves basic 75 - 89% of the time/requires cueing 10 - 24% of the time  Memory Memory assist level: Recognizes or recalls 75 - 89% of the time/requires cueing 10 - 24% of the time   Medical Problem List and Plan: 1.Decreased functional mobility with apraxiasecondary to right SDH/EDHwith initial craniotomy evacuation hematoma 8/93/7342 complicated by epidural hematoma with evacuation 03/25/2018 and follow-up decompressive right frontal parietal craniectomy implantation of bone  flap 03/28/2018.   Continue CIR -helmet ordered for cranial protection 2. DVT Prophylaxis/Anticoagulation: SDH. Monitor for any signs of DVT 3. Pain Management:Hydrocodone and Fioricet as needed.  -added low dose topamax 25mg  qhs, increased to twice a day on 7/1 4. Mood:Provide emotional support 5. Neuropsych: This patientisnot fully capable of making decisions on hisown behalf. 6. Skin/Wound Care:Routine skin checks 7. Fluids/Electrolytes/Nutrition:encourage PO 8.Seizure prophylaxis. Keppra 1000 mg every 12 hours as his seizure risk is high 9.Hyperlipidemia. Zocor 10.Constipation. Laxative assistance 11.? Reactive hypertension  Labile on 7/1 12. Hypokalemia  Potassium 4.4 on 7/1  Supplement at 2 days on 6/29 13.Hypoalbuminemia  Supplement initiated on 6/29 14. Leukocytosis: Resolved   WBCs 8.9 on 7/1  Continue to monitor  Afebrile 15. Acute blood loss anemia  Hemoccult 10.2 on 7/1  Continue to monitor 16. Dental Carries  Oral gel ordered on 6/29  Improving 17. Hyperglycemia  Slightly elevated on 7/1  Likely stress-induced  LOS (Days) 4 A FACE TO FACE EVALUATION WAS PERFORMED  Ankit Lorie Phenix, MD 04/08/2018 9:05 AM

## 2018-04-09 ENCOUNTER — Inpatient Hospital Stay (HOSPITAL_COMMUNITY): Payer: 59

## 2018-04-09 ENCOUNTER — Inpatient Hospital Stay (HOSPITAL_COMMUNITY): Payer: 59 | Admitting: Occupational Therapy

## 2018-04-09 ENCOUNTER — Inpatient Hospital Stay (HOSPITAL_COMMUNITY): Payer: 59 | Admitting: Speech Pathology

## 2018-04-09 NOTE — Patient Care Conference (Addendum)
Inpatient RehabilitationTeam Conference and Plan of Care Update Date: 04/09/2018   Time: 10:35 AM    Patient Name: Barry Miranda      Medical Record Number: [id number]  Date of Birth: 1960/10/26 Sex: Male         Room/Bed: 4W12C/4W12C-01 Payor Info: Payor: [employer] / Plan: [facility] / Product Type: *No Product type* /    Admitting Diagnosis: sdh with crani bone flap  Admit Date/Time:  04/04/2018  5:06 PM Admission Comments: No comment available   Primary Diagnosis:  <principal problem not specified> Principal Problem: <principal problem not specified>  Patient Active Problem List   Diagnosis Date Noted  . Hyperglycemia   . Vascular headache   . Executive function deficit   . Labile blood pressure   . Hypokalemia   . Hypoalbuminemia due to protein-calorie malnutrition ([place])   . Leukocytosis   . Dental caries   . Midline shift of brain   . Subdural bleeding ([place])   . Hyperlipidemia   . Tobacco abuse   . Sinus tachycardia   . Reactive hypertension   . Hypernatremia   . Acute blood loss anemia   . Encounter for intubation   . Epidural hematoma ([place]) 03/25/2018  . Acute respiratory failure ([place])   . Non-traumatic acute subdural hematoma ([place]) 03/24/2018  . Subdural hematoma ([place]) 03/24/2018  . Chronic calculus cholecystitis 09/11/2011    Expected Discharge Date: Expected Discharge Date: 04/13/18  Team Members Present: Physician leading conference: Dr. Delice Lesch Nurse Present: Leonette Nutting, RN PT Present: Canary Brim, PT OT Present: Benay Pillow, OT SLP Present: Charolett Bumpers, SLP PPS Coordinator present : Daiva Nakayama, RN, CRRN     Current Status/Progress Goal Weekly Team Focus  Medical   Decreased functional mobility with apraxia secondary to right SDH/EDH with initial craniotomy evacuation hematoma 4/65/6812 complicated by epidural hematoma with evacuation 03/25/2018 and follow-up decompressive right frontal parietal craniectomy implantation  of bone flap 03/28/2018  Improve mobility, impulsiveness, hyperglycemia, dnetal pain, ABLA, hypokalemia, HTN  See above   Bowel/Bladder   Patient continent of B/B min assist   manage B/B with min assist while in rehab  monitor    Swallow/Nutrition/ Hydration   Regular diet; thin liquids   monitor       ADL's   supervision  supervision  pt/family education, standing balance, endurane, activity tolerance, safety awareness   Mobility   supervision   monitor   ambualtion, transfers, family ed., d/c planning, high level balance    Communication             Safety/Cognition/ Behavioral Observations  Max A   Max A  basic problem solving, recall, orientation, inattention, intellectual awareness, sustained attention, impusive    Pain   pt  has c/o of headaches   assess for pain and treat accordingly       Skin   pt has staples to rt side of head; foam drsg to rt side of neck from old central line   remain free from any new skin issues        Rehab Goals Patient on target to meet rehab goals: Yes *See Care Plan and progress notes for long and short-term goals.     Barriers to Discharge  Current Status/Progress Possible Resolutions Date Resolved   Physician    Medical stability;Wound Care     See above  Therapies, optimize BP, follow labs, follow CBGs      Nursing  Other (comments)  none  PT  Medical stability;Inaccessible home environment;Home environment access/layout                 OT                  SLP Home environment access/layout              SW                Discharge Planning/Teaching Needs:  patient to discharge home w/spouse who can provide 24/7 supervision  teaching needs prior to d/c home    Team Discussion:  Decreased HA; watching labs.  Cont b/b but very impulsive.  Surgical incision healing nicely.  Close to goals level of supervision.  Ready to begin family ed and d/c end of week.  Revisions to Treatment Plan:  None     Continued Need for  Acute Rehabilitation Level of Care: The patient requires daily medical management by a physician with specialized training in physical medicine and rehabilitation for the following conditions: Daily direction of a multidisciplinary physical rehabilitation program to ensure safe treatment while eliciting the highest outcome that is of practical value to the patient.: Yes Daily medical management of patient stability for increased activity during participation in an intensive rehabilitation regime.: Yes Daily analysis of laboratory values and/or radiology reports with any subsequent need for medication adjustment of medical intervention for : Post surgical problems;Neurological problems;Wound care problems;Blood pressure problems;Other  Erica Richwine 04/09/2018, 2:08 PM

## 2018-04-09 NOTE — Progress Notes (Signed)
[facility] PHYSICAL MEDICINE & REHABILITATION     PROGRESS NOTE    Subjective/Complaints: Pt seen ambulating with nurse tech this AM.  He slept well overnight.  He notes improvement in headache with medications. Tooth pain has resolved.  ROS: Denies CP, SOB, N/V/D  Objective:  No results found. Recent Labs    04/08/18 0635  WBC 8.9  HGB 10.2*  HCT 31.7*  PLT 391   Recent Labs    04/08/18 0635  NA 138  K 4.4  CL 105  GLUCOSE 108*  BUN 16  CREATININE 0.74  CALCIUM 8.6*   CBG (last 3)  No results for input(s): GLUCAP in the last 72 hours.  Wt Readings from Last 3 Encounters:  04/04/18 69.5 kg (153 lb 3.5 oz)  03/24/18 69.5 kg (153 lb 3.5 oz)  10/17/11 71.5 kg (157 lb 9.6 oz)     Intake/Output Summary (Last 24 hours) at 04/09/2018 1006 Last data filed at 04/09/2018 0830 Gross per 24 hour  Intake 600 ml  Output -  Net 600 ml    Vital Signs: Blood pressure 136/79, pulse 73, temperature 97.7 F (36.5 C), resp. rate 17, height 5\' 4"  (1.626 m), weight 69.5 kg (153 lb 3.5 oz), SpO2 97 %. Physical Exam:  Constitutional: No distress . Vital signs reviewed. HENT: Normocephalic.  Atraumatic. Poor dentition Eyes: EOMI. No discharge. Cardiovascular: RRR. No JVD. Respiratory: CTA Bilaterally. Normal effort. GI: BS +. Non-distended. Musc: No edema or tenderness in extremities. Neurological: He isalert. Decreased insight and awareness.  Motor: LUE/LLE: 44+5 proximal to distal RUE/RLE 5/5 proximal to distal Skin: Skin iswarmand dry. 2 small blisters on right neck unchanged. Psychiatric:Flat   Assessment/Plan: 1. Functional deficits secondary to right SDH which require 3+ hours per day of interdisciplinary therapy in a comprehensive inpatient rehab setting. Physiatrist is providing close team supervision and 24 hour management of active medical problems listed below. Physiatrist and rehab team continue to assess barriers to discharge/monitor patient progress toward  functional and medical goals.  Function:  Bathing Bathing position Bathing activity did not occur: Refused    Bathing parts      Bathing assist        Upper Body Dressing/Undressing Upper body dressing Upper body dressing/undressing activity did not occur: Refused                  Upper body assist        Lower Body Dressing/Undressing Lower body dressing Lower body dressing/undressing activity did not occur: Refused What is the patient wearing?: Non-skid slipper socks                              Lower body assist        Toileting Toileting   Toileting steps completed by patient: Adjust clothing prior to toileting, Performs perineal hygiene, Adjust clothing after toileting Toileting steps completed by helper: Adjust clothing prior to toileting, Performs perineal hygiene, Adjust clothing after toileting(per Berkley Harvey, NT)    Toileting assist Assist level: Touching or steadying assistance (Pt.75%)   Transfers Chair/bed transfer   Chair/bed transfer method: Ambulatory Chair/bed transfer assist level: Supervision or verbal cues Chair/bed transfer assistive device: Armrests     Locomotion Ambulation     Max distance: >150 ft Assist level: Supervision or verbal cues   Wheelchair     Max wheelchair distance: 121ft  Assist Level: Touching or steadying assistance (Pt > 75%)  Cognition Comprehension Comprehension  assist level: Understands basic 90% of the time/cues < 10% of the time  Expression Expression assist level: Expresses basic 90% of the time/requires cueing < 10% of the time.  Social Interaction Social Interaction assist level: Interacts appropriately 75 - 89% of the time - Needs redirection for appropriate language or to initiate interaction.  Problem Solving Problem solving assist level: Solves basic 75 - 89% of the time/requires cueing 10 - 24% of the time  Memory Memory assist level: Recognizes or recalls 75 - 89% of the time/requires  cueing 10 - 24% of the time   Medical Problem List and Plan: 1.Decreased functional mobility with apraxiasecondary to right SDH/EDHwith initial craniotomy evacuation hematoma 2/44/9753 complicated by epidural hematoma with evacuation 03/25/2018 and follow-up decompressive right frontal parietal craniectomy implantation of bone flap 03/28/2018.   Continue CIR  helmet for cranial protection   Team conference today 2. DVT Prophylaxis/Anticoagulation: SDH. Monitor for any signs of DVT 3. Pain Management:Hydrocodone and Fioricet as needed.  -added low dose topamax 25mg  qhs, increased to twice a day on 7/1 4. Mood:Provide emotional support 5. Neuropsych: This patientisnot fully capable of making decisions on hisown behalf. 6. Skin/Wound Care:Routine skin checks 7. Fluids/Electrolytes/Nutrition:encourage PO 8.Seizure prophylaxis. Keppra 1000 mg every 12 hours as his seizure risk is high 9.Hyperlipidemia. Zocor 10.Constipation. Laxative assistance 11.? Reactive hypertension  Improving on 7/2 12. Hypokalemia  Potassium 4.4 on 7/1  Supplement at 2 days on 6/29 13.Hypoalbuminemia  Supplement initiated on 6/29 14. Leukocytosis: Resolved   WBCs 8.9 on 7/1  Continue to monitor  Afebrile 15. Acute blood loss anemia  Hb 10.2 on 7/1  Continue to monitor 16. Dental Carries  Oral gel ordered on 6/29  Improved 17. Hyperglycemia  Slightly elevated on 7/1  Likely stress-induced  LOS (Days) 5 A FACE TO FACE EVALUATION WAS PERFORMED  Ankit Lorie Phenix, MD 04/09/2018 10:06 AM

## 2018-04-09 NOTE — Progress Notes (Signed)
Occupational Therapy Session Note  Patient Details  Name: [name] MRN: [id number] Date of Birth: 02/21/1961  Today's Date: 04/09/2018 OT Individual Time: 5189-8421 OT Individual Time Calculation (min): 28 min    Short Term Goals: Week 1:  OT Short Term Goal 1 (Week 1): STG=LTG d/t ELOS  Skilled Therapeutic Interventions/Progress Updates:    Upon entering the room, pt seated on EOB with RN present in room and pt's family members present. Skilled OT intervention with focus on hands on caregiver training and cognition. OT educating his daughter, Jarrett Soho, on giving verbal guidance cues and close supervision with ambulation for safety. She provided supervision as pt directing himself towards main entrance. Pt requiring increased time and mod verbal guidance cues from therapist and family to navigate to main entrance and return to room. Pt seated in wheelchair with daughter in day room at end of session. RN notified and daughter cleared for transfers within pt room. All needs within reach.   Therapy Documentation Precautions:  Precautions Precautions: Fall, Other (comment) Precaution Comments: R craniectomy with bone flap removed Restrictions Weight Bearing Restrictions: No General:   Vital Signs: Therapy Vitals Temp: 98.2 F (36.8 C) Pulse Rate: 73 Resp: 16 BP: 106/64 Patient Position (if appropriate): Lying Oxygen Therapy SpO2: 98 % O2 Device: Room Air Pain: Pain Assessment Pain Scale: 0-10 Pain Score: 5  Pain Type: Acute pain Pain Location: Head Pain Orientation: Right;Anterior Pain Descriptors / Indicators: Aching Pain Frequency: Intermittent Patients Stated Pain Goal: 0 Pain Intervention(s): Medication (See eMAR)  See Function Navigator for Current Functional Status.   Therapy/Group: Individual Therapy  Gypsy Decant 04/09/2018, 4:17 PM

## 2018-04-09 NOTE — Progress Notes (Signed)
Speech Language Pathology Daily Session Note  Patient Details  Name: Barry Miranda MRN: [id number] Date of Birth: [birth year]  Today's Date: 04/09/2018 SLP Individual Time: 7793-9030 SLP Individual Time Calculation (min): 55 min  Short Term Goals: Week 1: SLP Short Term Goal 1 (Week 1): Pt will utilize external memory aides to recall new information with Max A cues.  SLP Short Term Goal 1 - Progress (Week 1): Revised due to lack of progress SLP Short Term Goal 2 (Week 1): Pt will demonstrate intellectual awareness identifying 2 physical and 2 cognitive deficits related to BI with Max A cues.  SLP Short Term Goal 2 - Progress (Week 1): Revised due to lack of progress SLP Short Term Goal 3 (Week 1): Pt will scan to left of midline to locate objects with Mod A cues.  SLP Short Term Goal 4 (Week 1): Pt will complete basic problem solving tasks with Max A cues.  SLP Short Term Goal 4 - Progress (Week 1): Revised due to lack of progress SLP Short Term Goal 5 (Week 1): Pt will demonstrate sustained attention for ~ 5 minutes with Max A.  SLP Short Term Goal 5 - Progress (Week 1): Revised due to lack of progress  Skilled Therapeutic Interventions:  Pt was seen for skilled ST targeting cognitive goals.  Pt was oriented to date with supervision question cues and generally to situation with min question cues.  Pt needed mod-max question cues to identify current deficits and their impact on his functional independence in the home environment.  Pt reports that he will have 24/7 supervision at discharge between his wife and two daughters.  Family not present to verify.  SLP facilitated the session with money management tasks to address goals for sustained attention, memory, and problem solving.   Pt initially required max assist to count money and make change; however, when using written cues to compensate for working memory deficits SLP was able to fade cues to min-mod assist.  Pt sustained his attention to task  for ~5 minute intervals with min cues for redirection to task.  Pt was returned to room and left in bed with bed alarm set and call bell within reach.  Continue per current plan of care.    Function:  Eating Eating   Modified Consistency Diet: No Eating Assist Level: No help, No cues           Cognition Comprehension Comprehension assist level: Understands basic 90% of the time/cues < 10% of the time  Expression   Expression assist level: Expresses basic 90% of the time/requires cueing < 10% of the time.  Social Interaction Social Interaction assist level: Interacts appropriately 75 - 89% of the time - Needs redirection for appropriate language or to initiate interaction.  Problem Solving Problem solving assist level: Solves basic 50 - 74% of the time/requires cueing 25 - 49% of the time  Memory Memory assist level: Recognizes or recalls 50 - 74% of the time/requires cueing 25 - 49% of the time    Pain Pain Assessment Pain Scale: 0-10 Pain Score: 5  Pain Type: Acute pain Pain Location: Head Pain Descriptors / Indicators: Aching Pain Intervention(s): Other (Comment)(pt declined meds, stating that pain was manageable)  Therapy/Group: Individual Therapy  Barry Miranda, Selinda Orion 04/09/2018, 12:43 PM

## 2018-04-09 NOTE — Plan of Care (Signed)
  Problem: Spiritual Needs Goal: Ability to function at adequate level Outcome: Progressing   Problem: Consults Goal: RH BRAIN INJURY PATIENT EDUCATION Description Description: See Patient Education module for eduction specifics. Mod assist  Outcome: Progressing Goal: Skin Care Protocol Initiated - if Braden Score 18 or less Description If consults are not indicated, leave blank or document N/A Outcome: Progressing Goal: Nutrition Consult-if indicated Outcome: Progressing Goal: Diabetes Guidelines if Diabetic/Glucose > 140 Description If diabetic or lab glucose is > 140 mg/dl - Initiate Diabetes/Hyperglycemia Guidelines & Document Interventions  Outcome: Progressing   Problem: RH BOWEL ELIMINATION Goal: RH STG MANAGE BOWEL WITH ASSISTANCE Description STG Manage Bowel with Assistance.mod assist  Outcome: Progressing Goal: RH STG MANAGE BOWEL W/MEDICATION W/ASSISTANCE Description STG Manage Bowel with Medication with Assistance. Outcome: Progressing   Problem: RH BLADDER ELIMINATION Goal: RH STG MANAGE BLADDER WITH ASSISTANCE Description STG Manage Bladder With Assistance. Mod assist  Outcome: Progressing   Problem: RH SKIN INTEGRITY Goal: RH STG SKIN FREE OF INFECTION/BREAKDOWN Description Patient will be free of skin infection entire stay on rehab  Outcome: Progressing Goal: RH STG MAINTAIN SKIN INTEGRITY WITH ASSISTANCE Description STG Maintain Skin Integrity With Assistance.mod assist  Outcome: Progressing   Problem: RH SAFETY Goal: RH STG ADHERE TO SAFETY PRECAUTIONS W/ASSISTANCE/DEVICE Description STG Adhere to Safety Precautions With Assistance/Device.mod assist  Outcome: Progressing

## 2018-04-09 NOTE — Progress Notes (Signed)
Physical Therapy Session Note  Patient Details  Name: Barry Miranda MRN: [id number] Date of Birth: 1961-06-05  Today's Date: 04/09/2018 PT Individual Time: 0800-0858 PT Individual Time Calculation (min): 58 min   Short Term Goals: Week 1:  PT Short Term Goal 1 (Week 1): STG =LTG due to ELOS  Skilled Therapeutic Interventions/Progress Updates:    Pt supine in bed upon PT arrival, agreeable to therapy tx and denies pain. Pt transferred to sitting EOB with supervision and donned helmet. Pt ambulated from room>gym with supervision x 150 ft. Pt worked on dynamic standing balance and cognitive remediation this session to perform tasks while standing on airex including peg board puzzle x 2 designs, pipe tree, and simple word search puzzle. Pt with increased time to complete these tasks secondary to decreased processing speed. Pt ambulated back to room to use bathroom with supervision, ambulated to sink after to wash hands. Pt ambulated to [facility] gym with supervision. Pt used dynavision working on dynamic standing balance while standing on airex, x 3 trials with decreased overall reaction time. Pt ambulated back to room and left seated in w/c with needs in reach.   Therapy Documentation Precautions:  Precautions Precautions: Fall, Other (comment) Precaution Comments: R craniectomy with bone flap removed Restrictions Weight Bearing Restrictions: No   See Function Navigator for Current Functional Status.   Therapy/Group: Individual Therapy  Netta Corrigan, PT, DPT 04/09/2018, 7:45 AM

## 2018-04-09 NOTE — Progress Notes (Signed)
Occupational Therapy Session Note  Patient Details  Name: Barry Miranda MRN: [id number] Date of Birth: 11-17-1960  Today's Date: 04/09/2018 OT Individual Time: 1105-1200 OT Individual Time Calculation (min): 55 min    Short Term Goals: Week 1:  OT Short Term Goal 1 (Week 1): STG=LTG d/t ELOS  Skilled Therapeutic Interventions/Progress Updates:    Treatment session with focus on ADL retraining and BUE coordination.  Pt received supine in bed willing to engage in bathing at shower level.  Pt picked out clothing to complete dressing post shower.  Ambulated to room shower with supervision.  Pt completed toilet transfer and toileting with distant supervision.  Completed majority of shower at standing level, alternating single leg stance when washing lower legs and feet.  Pt completed dressing in standing with min guard when donning shirt and supervision in single leg stance when donning underwear and pants.  Pt sat to don socks. Engaged in 9 hole peg test and box and blocks assessment with pt demonstrating improved fine and gross motor control in LUE.  9 hole peg test: Rt: 29.6 seconds and Lt: 29.1 seconds, Box and blocks: Rt:41 blocks and Lt 42 blocks. Completed pipe tree puzzle in standing with focus on sequencing and awareness of errors.  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  Completed 1st assembly without any errors or assistance, required mod cues for problem solving during more complex pattern.  Returned to room and left seated upright in w/c with seat belt alarm fastened and all needs in reach.  Therapy Documentation Precautions:  Precautions Precautions: Fall, Other (comment) Precaution Comments: R craniectomy with bone flap removed Restrictions Weight Bearing Restrictions: No Pain: Pain Assessment Pain Scale: 0-10 Pain Score: 3   See Function Navigator for Current Functional  Status.   Therapy/Group: Individual Therapy  Simonne Come 04/09/2018, 12:09 PM

## 2018-04-10 ENCOUNTER — Inpatient Hospital Stay (HOSPITAL_COMMUNITY): Payer: 59 | Admitting: Occupational Therapy

## 2018-04-10 ENCOUNTER — Inpatient Hospital Stay (HOSPITAL_COMMUNITY): Payer: 59 | Admitting: Speech Pathology

## 2018-04-10 ENCOUNTER — Inpatient Hospital Stay (HOSPITAL_COMMUNITY): Payer: 59 | Admitting: Physical Therapy

## 2018-04-10 MED ORDER — CARBAMIDE PEROXIDE 6.5 % OT SOLN
5.0000 [drp] | Freq: Two times a day (BID) | OTIC | Status: AC
  Administered 2018-04-10 – 2018-04-12 (×6): 5 [drp] via OTIC
  Filled 2018-04-10 (×2): qty 15

## 2018-04-10 MED ORDER — PROPRANOLOL HCL 10 MG PO TABS
10.0000 mg | ORAL_TABLET | Freq: Three times a day (TID) | ORAL | Status: DC
  Administered 2018-04-10 – 2018-04-11 (×3): 10 mg via ORAL
  Filled 2018-04-10 (×3): qty 1

## 2018-04-10 NOTE — Progress Notes (Signed)
SLP Cancellation Note  Patient Details Name: Barry Miranda MRN: [id number] DOB: [birth year]   Cancelled treatment:        Attempted to see pt x2 for scheduled ST therapy session.  Pt presented with complaints of 7/10 headache and lethargy.  Pt reports he did not sleep at all last night.  RN was administering medication upon therapist's arrival for headache but pt was requesting to defer treatment for 30 min at this time.  When therapist checked back on pt 30 min later, pt reports improved headache but was still very drowsy and requested more time for rest.  As a result, pt missed 60 min of ST.  SLP will follow up at next available appointment.                                                                                                  Dakotah Orrego, Selinda Orion 04/10/2018, 2:58 PM

## 2018-04-10 NOTE — Progress Notes (Signed)
Physical Therapy Session Note  Patient Details  Name: Barry Miranda MRN: [id number] Date of Birth: 20-Oct-1960  Today's Date: 04/10/2018 PT Individual Time: 0900-0950 PT Individual Time Calculation (min): 50 min   Short Term Goals: Week 1:  PT Short Term Goal 1 (Week 1): STG =LTG due to ELOS  Skilled Therapeutic Interventions/Progress Updates:   Pt in supine and agreeable to therapy, denies pain at rest. Session focused on standing balance and overall endurance. Ambulated around unit w/ distant supervision in >150' bouts. Worked on standing balance on compliant surface while performing UE tasks reaching to edge of BOS emphasizing trunk rotation and trunk bending to maintain balance. Performed partial knee bends and heel raises on compliant surface as well to strengthen postural control and ankle balance strategies. Performed biodex limits of stability training at lowest setting w/o UE support, verbal cues for ankle balance strategies, otherwise close supervision for all balance activities this session. Pt c/o increasing R ear pain and feeling "like there is water in there". Made RN aware and pt asking questions about medicines he is taking for ear pain. Pt agreeable to continue at slow pace. Performed NuStep 10 min @ level 3 for LE strengthening, min verbal cues for technique and attention to task. Returned to room and ended session in supine, call bell within reach and all needs met.   Therapy Documentation Precautions:  Precautions Precautions: Fall, Other (comment) Precaution Comments: R craniectomy with bone flap removed Restrictions Weight Bearing Restrictions: No Pain: Pain Assessment Pain Scale: 0-10 Pain Score: 0-No pain  See Function Navigator for Current Functional Status.   Therapy/Group: Individual Therapy  Aliany Fiorenza K Arnette 04/10/2018, 9:55 AM

## 2018-04-10 NOTE — Progress Notes (Signed)
Physical Therapy Session Note  Patient Details  Name: Barry Miranda MRN: [id number] Date of Birth: 1961-03-11  Today's Date: 04/10/2018 PT Individual Time: 1400-1430 PT Individual Time Calculation (min): 30 min   Short Term Goals: Week 1:  PT Short Term Goal 1 (Week 1): STG =LTG due to ELOS  Skilled Therapeutic Interventions/Progress Updates:   Pt received supine in bed and agreeable to PT with significant encouragement from PT. Supine>sit transfer without assist and no cues. Pt reports that HA from earlier in day has subsided mildly to 5/10. Gait with supervision assist from PT with occasional cues for doorway management to enter day room.  Standing balance and sustained attention task of corn hole. Pt noted to have improved affect and attention to task with increased time, no LOB noted throughout. Gait in day room to prepare snack and ambulate back to room while holding drink and cup full of assorted snacks. Not LOB, abel to attend to bath cups without spilling.   Pt returned to room and performed ambulatory transfer to bed with distant supervision. Sit>supine completed without assist and left supine in bed with call bell in reach and all needs met.       Therapy Documentation Precautions:  Precautions Precautions: Fall, Other (comment) Precaution Comments: R craniectomy with bone flap removed Restrictions Weight Bearing Restrictions: No    Vital Signs: Therapy Vitals Temp: 97.9 F (36.6 C) Temp Source: Oral Pulse Rate: 83 Resp: 16 BP: 114/74 Patient Position (if appropriate): Sitting Oxygen Therapy SpO2: 100 % O2 Device: Room Air Pain:   5/10 HA  See Function Navigator for Current Functional Status.   Therapy/Group: Individual Therapy  Lorie Phenix 04/10/2018, 5:41 PM

## 2018-04-10 NOTE — Progress Notes (Signed)
[facility] [facility] PHYSICAL MEDICINE & REHABILITATION     PROGRESS NOTE    Subjective/Complaints: Pt seen sitting up in bed this morning. He states he slept fairly overnight.  He states his headaches are about the same..    ROS: +headache. Denies CP, SOB, N/V/D  Objective:  No results found. Recent Labs    04/08/18 0635  WBC 8.9  HGB 10.2*  HCT 31.7*  PLT 391   Recent Labs    04/08/18 0635  NA 138  K 4.4  CL 105  GLUCOSE 108*  BUN 16  CREATININE 0.74  CALCIUM 8.6*   CBG (last 3)  No results for input(s): GLUCAP in the last 72 hours.  Wt Readings from Last 3 Encounters:  04/04/18 69.5 kg (153 lb 3.5 oz)  03/24/18 69.5 kg (153 lb 3.5 oz)  10/17/11 71.5 kg (157 lb 9.6 oz)     Intake/Output Summary (Last 24 hours) at 04/10/2018 1058 Last data filed at 04/10/2018 0707 Gross per 24 hour  Intake 740 ml  Output -  Net 740 ml    Vital Signs: Blood pressure 112/75, pulse 73, temperature 99.1 F (37.3 C), temperature source Oral, resp. rate 19, height 5\' 4"  (1.626 m), weight 69.5 kg (153 lb 3.5 oz), SpO2 98 %. Physical Exam:  Constitutional: No distress . Vital signs reviewed. HENT: Normocephalic.  Atraumatic. Poor dentition Eyes: EOMI. No discharge. Cardiovascular: RRR. No JVD. Respiratory: CTA Bilaterally. Normal effort. GI: BS +. Non-distended. Musc: No edema or tenderness in extremities. Neurological: He isalert. Decreased insight and awareness.  Motor: LUE/LLE: 4/5 proximal to distal RUE/RLE 5/5 proximal to distal Skin: Skin iswarmand dry. 2 small blisters on right neck unchanged. Psychiatric:Flat   Assessment/Plan: 1. Functional deficits secondary to right SDH which require 3+ hours per day of interdisciplinary therapy in a comprehensive inpatient rehab setting. Physiatrist is providing close team supervision and 24 hour management of active medical problems listed below. Physiatrist and rehab team continue to assess barriers to discharge/monitor patient  progress toward functional and medical goals.  Function:  Bathing Bathing position Bathing activity did not occur: Refused Position: Shower  Bathing parts Body parts bathed by patient: Right arm, Left arm, Chest, Abdomen, Front perineal area, Buttocks, Right upper leg, Left upper leg, Right lower leg, Left lower leg, Back    Bathing assist Assist Level: Set up, Supervision or verbal cues   Set up : To obtain items  Upper Body Dressing/Undressing Upper body dressing Upper body dressing/undressing activity did not occur: Refused What is the patient wearing?: Pull over shirt/dress     Pull over shirt/dress - Perfomed by patient: Thread/unthread right sleeve, Thread/unthread left sleeve, Put head through opening, Pull shirt over trunk          Upper body assist Assist Level: Supervision or verbal cues      Lower Body Dressing/Undressing Lower body dressing Lower body dressing/undressing activity did not occur: Refused What is the patient wearing?: Underwear, Pants, Non-skid slipper socks Underwear - Performed by patient: Thread/unthread right underwear leg, Thread/unthread left underwear leg, Pull underwear up/down   Pants- Performed by patient: Thread/unthread right pants leg, Thread/unthread left pants leg, Pull pants up/down   Non-skid slipper socks- Performed by patient: Don/doff right sock, Don/doff left sock                    Lower body assist Assist for lower body dressing: Set up, Supervision or verbal cues   Set up : To obtain clothing/put away  Toileting Toileting   Toileting steps completed by patient: Adjust clothing prior to toileting, Performs perineal hygiene, Adjust clothing after toileting Toileting steps completed by helper: Adjust clothing prior to toileting, Performs perineal hygiene, Adjust clothing after toileting(per Berkley Harvey, NT)    Toileting assist Assist level: Supervision or verbal cues   Transfers Chair/bed transfer   Chair/bed  transfer method: Stand pivot Chair/bed transfer assist level: Supervision or verbal cues Chair/bed transfer assistive device: Armrests     Locomotion Ambulation     Max distance: >150' Assist level: Supervision or verbal cues   Wheelchair     Max wheelchair distance: 155ft  Assist Level: Touching or steadying assistance (Pt > 75%)  Cognition Comprehension Comprehension assist level: Understands basic 90% of the time/cues < 10% of the time  Expression Expression assist level: Expresses basic 90% of the time/requires cueing < 10% of the time.  Social Interaction Social Interaction assist level: Interacts appropriately 75 - 89% of the time - Needs redirection for appropriate language or to initiate interaction.  Problem Solving Problem solving assist level: Solves basic 75 - 89% of the time/requires cueing 10 - 24% of the time  Memory Memory assist level: Recognizes or recalls 75 - 89% of the time/requires cueing 10 - 24% of the time   Medical Problem List and Plan: 1.Decreased functional mobility with apraxiasecondary to right SDH/EDHwith initial craniotomy evacuation hematoma 2/39/5320 complicated by epidural hematoma with evacuation 03/25/2018 and follow-up decompressive right frontal parietal craniectomy implantation of bone flap 03/28/2018.   Continue CIR  helmet for cranial protection  2. DVT Prophylaxis/Anticoagulation: SDH. Monitor for any signs of DVT 3. Pain Management:Hydrocodone and Fioricet as needed.  Added low dose topamax 25mg  qhs, increased to twice a day on 7/1  Propranolol 10 mg 3 times a day started on 7/3 4. Mood:Provide emotional support 5. Neuropsych: This patientisnot fully capable of making decisions on hisown behalf. 6. Skin/Wound Care:Routine skin checks 7. Fluids/Electrolytes/Nutrition:encourage PO 8.Seizure prophylaxis. Keppra 1000 mg every 12 hours as his seizure risk is high 9.Hyperlipidemia. Zocor 10.Constipation. Laxative  assistance 11.? Reactive hypertension  Controlled on 7/3 12. Hypokalemia  Potassium 4.4 on 7/1  Supplement at 2 days on 6/29  Plan order labs for Friday 13.Hypoalbuminemia  Supplement initiated on 6/29 14. Leukocytosis: Resolved   WBCs 8.9 on 7/1  Continue to monitor  Afebrile 15. Acute blood loss anemia  Hb 10.2 on 7/1  Continue to monitor 16. Dental Carries  Oral gel ordered on 6/29  Improved 17. Hyperglycemia  Slightly elevated on 7/1  Likely stress-induced  LOS (Days) 6 A FACE TO FACE EVALUATION WAS PERFORMED  Barry Miranda Lorie Phenix, MD 04/10/2018 10:58 AM

## 2018-04-10 NOTE — Progress Notes (Signed)
Occupational Therapy Session Note  Patient Details  Name: Barry Miranda MRN: [id number] Date of Birth: 1960/11/08  Today's Date: 04/10/2018 OT Individual Time: 1829-9371 OT Individual Time Calculation (min): 54 min    Short Term Goals: Week 1:  OT Short Term Goal 1 (Week 1): STG=LTG d/t ELOS  Skilled Therapeutic Interventions/Progress Updates:    Pt completed bathing and dressing during session.  He needed mod questioning cueing to sequence gathering clothing secondary to getting out 2 pair of boxers and no shirt.  He realized on his own he needed a shirt but did not recognize that he had the 2 pair of boxers.  He needed min instructional cueing to stand inside of the shower as he wanted to stay on the outside edge because of the shower bench.  Encouraged pt to sit as he was not wearing his helmet during the bath.  He did complete bathing without cueing and then dried off in standing with min guard assist.  Dressing in standing as well with occasional min guard assist when attempting to thread the second leg in his pants.  He exhibited LOB to the left but was able to self correct it.  Min assist for donning TEDs sitting EOB.  Pt distracted by therapist conversation throughout session but was able to return to task with increased time and min questioning cueing.  He was not oriented to correct day of month or week, missing them by 1 day, stating today was the 2nd and Tuesday.  Finished session with functional mobility down to the ortho gym and back with supervision.  Pt left in bed at end of session with call button and phone in reach and bed alarm in place.    Therapy Documentation Precautions:  Precautions Precautions: Fall, Other (comment) Precaution Comments: R craniectomy with bone flap removed Restrictions Weight Bearing Restrictions: No   Pain: Pain Assessment Pain Scale: Faces Pain Score: 0-No pain Faces Pain Scale: Hurts little more Pain Type: Acute pain Pain Location: Ear Pain  Orientation: Right Pain Descriptors / Indicators: Aching;Discomfort Pain Onset: On-going Pain Intervention(s): RN made aware;Emotional support Multiple Pain Sites: No ADL: See Function Navigator for Current Functional Status.   Therapy/Group: Individual Therapy  Iaan Oregel OTR/L 04/10/2018, 12:45 PM

## 2018-04-11 ENCOUNTER — Inpatient Hospital Stay (HOSPITAL_COMMUNITY): Payer: 59 | Admitting: Speech Pathology

## 2018-04-11 ENCOUNTER — Encounter (HOSPITAL_COMMUNITY): Payer: 59 | Admitting: Occupational Therapy

## 2018-04-11 ENCOUNTER — Inpatient Hospital Stay (HOSPITAL_COMMUNITY): Payer: 59 | Admitting: Physical Therapy

## 2018-04-11 ENCOUNTER — Inpatient Hospital Stay (HOSPITAL_COMMUNITY): Payer: 59

## 2018-04-11 ENCOUNTER — Ambulatory Visit (HOSPITAL_COMMUNITY): Payer: 59 | Admitting: Physical Therapy

## 2018-04-11 DIAGNOSIS — G479 Sleep disorder, unspecified: Secondary | ICD-10-CM

## 2018-04-11 MED ORDER — PROPRANOLOL HCL 20 MG PO TABS
20.0000 mg | ORAL_TABLET | Freq: Three times a day (TID) | ORAL | Status: DC
  Administered 2018-04-11 – 2018-04-12 (×3): 20 mg via ORAL
  Filled 2018-04-11 (×3): qty 1

## 2018-04-11 MED ORDER — MELATONIN 3 MG PO TABS
1.5000 mg | ORAL_TABLET | Freq: Every day | ORAL | Status: DC
  Administered 2018-04-11: 1.5 mg via ORAL
  Filled 2018-04-11: qty 0.5

## 2018-04-11 MED ORDER — NON FORMULARY: 1.5000 mg | Freq: Every day | Status: DC

## 2018-04-11 NOTE — Progress Notes (Signed)
Occupational Therapy Session Note  Patient Details  Name: Barry Miranda MRN: [id number] Date of Birth: 29-Sep-1961  Today's Date: 04/11/2018 OT Individual Time: 6979-4801 OT Individual Time Calculation (min): 45 min    Short Term Goals: Week 1:  OT Short Term Goal 1 (Week 1): STG=LTG d/t ELOS  Skilled Therapeutic Interventions/Progress Updates:    1:1. Pt reporting 4/10 pain in head. RN aware and delivers medication. Pt grabs towel and wash cloth, however requires cueing to gather clothing prior to shower. Pt unable to notice/adjust water temp without cueing, just feeling the cold water run over hand. Pt stands for entire shower despite cueing to sit because not wearing helmet. Pt dresses standing with supervision and min LOB laterally when threading pants. Pt unable to problem solve when asked what would be safer way to get dressed to minimize fall risk. Pt verbalizes maybe getting dressed EOB tomorrow.  Exited session with pt seated in bed, RN administerin medication.  Therapy Documentation Precautions:  Precautions Precautions: Fall, Other (comment) Precaution Comments: R craniectomy with bone flap removed Restrictions Weight Bearing Restrictions: No  See Function Navigator for Current Functional Status.   Therapy/Group: Individual Therapy  [name] 04/11/2018, 8:15 AM

## 2018-04-11 NOTE — Progress Notes (Signed)
Social Work Patient ID: Barry Miranda, male   DOB: 1960-11-21, 57 y.o.   MRN: [id number]  Have reviewed team conference with pt and wife yesterday.  Both aware and agreeable with d/c date of 7/6.  Family completed education today and agreeable with plan for OP tx follow up.  Family prepared to provide 24/7 supervision/ assist.  Lennart Pall, LCSW

## 2018-04-11 NOTE — Progress Notes (Signed)
Occupational Therapy Session Note  Patient Details  Name: Barry Miranda MRN: [id number] Date of Birth: [birth year]  Today's Date: 04/11/2018 OT Individual Time: 1105-1150 OT Individual Time Calculation (min): 45 min    Short Term Goals: Week 1:  OT Short Term Goal 1 (Week 1): STG=LTG d/t ELOS  Skilled Therapeutic Interventions/Progress Updates:    Treatment session with focus on family education with pt, pt's wife, and daughters.  Pt already bathed and dressed with previous OT this AM.  Pt ambulated to ADL apt to complete simulated shower transfers.  Educated on recommendation for supervision with bathing and dressing at shower level.  Discussed recommendation for shower seat for increased safety and energy conservation, however when discussed they would have to purchase wife stated they would wait until they got home and assess his need for seat.  Therefore encouraged close supervision when bathing at standing level (with plan to assess during session tomorrow).  Discussed safety concerns with meal prep, recommending pt not do any cooking at this time.  Engaged in pipe tree activity in standing with focus on Lt attention to follow pattern and use of BUE.  Pt required min question cues to identify errors while complete pipe tree.  Educated pt's family on use of question cues to facilitate pt solving errors instead of telling him his errors. Wife understands this and states it will be a challenge for her to not immediately correct.  Pt reports fatigue and requests to return to room early.  Pt missed final 15 mins of therapy session.  Therapy Documentation Precautions:  Precautions Precautions: Fall, Other (comment) Precaution Comments: R craniectomy with bone flap removed Restrictions Weight Bearing Restrictions: No General: General OT Amount of Missed Time: 15 Minutes PT Missed Treatment Reason: Pain;Patient fatigue Pain: Pain Assessment Pain Scale: 0-10 Pain Score: 6  Pain Type: Acute  pain Pain Location: Head Pain Descriptors / Indicators: Headache Pain Frequency: Intermittent Pain Onset: On-going Patients Stated Pain Goal: 1 Pain Intervention(s): Medication (See eMAR)  See Function Navigator for Current Functional Status.   Therapy/Group: Individual Therapy  Simonne Come 04/11/2018, 12:20 PM

## 2018-04-11 NOTE — Progress Notes (Signed)
[facility]IC[facility] & RE[facility]ILIT[facility]     PROGRESS NOTE    Subjective/Complaints: Patient seen lying in bed this morning. He states he slept fairly overnight. He notes some improvement in headaches. She also notes some ear pressure yesterday to have improved with wax removal.  ROS: +headache. Denies CP, SOB, N/V/D  Objective:  No results found. No results for input(s): WBC, HGB, HCT, PLT in the last 72 hours. No results for input(s): NA, K, CL, GLUCOSE, BUN, CREATININE, CALCIUM in the last 72 hours.  Invalid input(s): CO CBG (last 3)  No results for input(s): GLUCAP in the last 72 hours.  Wt Readings from Last 3 Encounters:  04/04/18 69.5 kg (153 lb 3.5 oz)  03/24/18 69.5 kg (153 lb 3.5 oz)  10/17/11 71.5 kg (157 lb 9.6 oz)     Intake/Output Summary (Last 24 hours) at 04/11/2018 0855 Last data filed at 04/10/2018 1900 Gross per 24 hour  Intake 480 ml  Output -  Net 480 ml    Vital Signs: Blood pressure 120/69, pulse 74, temperature 99.1 F (37.3 C), temperature source Oral, resp. rate 18, height 5\' 4"  (1.626 m), weight 69.5 kg (153 lb 3.5 oz), SpO2 100 %. Physical Exam:  Constitutional: No distress . Vital signs reviewed. HENT: Normocephalic.  Atraumatic. Poor dentition Eyes: EOMI. No discharge. Cardiovascular: RRR. No JVD. Respiratory: CTA bilaterally. Normal effort. GI: BS +. Non-distended. Musc: No edema or tenderness in extremities. Neurological: He isalert. Decreased insight and awareness.  Motor: LUE/LLE: 4/5 proximal to distal (unchanged) Skin: Skin iswarmand dry. 2 small blisters on right neck improving. Psychiatric:Flat   Assessment/Plan: 1. Functional deficits secondary to right SDH which require 3+ hours per day of interdisciplinary therapy in a comprehensive inpatient rehab setting. Physiatrist is providing close team supervision and 24 hour management of active medical problems listed below. Physiatrist and rehab team continue to assess  barriers to discharge/monitor patient progress toward functional and medical goals.  Function:  Bathing Bathing position Bathing activity did not occur: Refused Position: Shower  Bathing parts Body parts bathed by patient: Right arm, Left arm, Chest, Abdomen, Front perineal area, Buttocks, Right upper leg, Left upper leg, Left lower leg, Right lower leg    Bathing assist Assist Level: Supervision or verbal cues   Set up : To obtain items  Upper Body Dressing/Undressing Upper body dressing Upper body dressing/undressing activity did not occur: Refused What is the patient wearing?: Pull over shirt/dress     Pull over shirt/dress - Perfomed by patient: Thread/unthread right sleeve, Thread/unthread left sleeve, Put head through opening, Pull shirt over trunk          Upper body assist Assist Level: Set up      Lower Body Dressing/Undressing Lower body dressing Lower body dressing/undressing activity did not occur: Refused What is the patient wearing?: Underwear, Pants, Ted Hose, Non-skid slipper socks Underwear - Performed by patient: Thread/unthread right underwear leg, Thread/unthread left underwear leg, Pull underwear up/down   Pants- Performed by patient: Thread/unthread right pants leg, Thread/unthread left pants leg, Pull pants up/down   Non-skid slipper socks- Performed by patient: Don/doff right sock, Don/doff left sock               TED Hose - Performed by patient: Don/doff right TED hose, Don/doff left TED hose    Lower body assist Assist for lower body dressing: Supervision or verbal cues   Set up : To obtain clothing/put away  Toileting Toileting   Toileting steps completed by  patient: Adjust clothing prior to toileting, Performs perineal hygiene, Adjust clothing after toileting Toileting steps completed by helper: Adjust clothing prior to toileting, Performs perineal hygiene, Adjust clothing after toileting(per Berkley Harvey, NT)    Toileting assist Assist  level: Supervision or verbal cues   Transfers Chair/bed transfer   Chair/bed transfer method: Ambulatory Chair/bed transfer assist level: Supervision or verbal cues Chair/bed transfer assistive device: Armrests     Locomotion Ambulation     Max distance: 200' Assist level: Supervision or verbal cues   Wheelchair     Max wheelchair distance: 158ft  Assist Level: Touching or steadying assistance (Pt > 75%)  Cognition Comprehension Comprehension assist level: Understands basic 90% of the time/cues < 10% of the time  Expression Expression assist level: Expresses basic 90% of the time/requires cueing < 10% of the time.  Social Interaction Social Interaction assist level: Interacts appropriately 75 - 89% of the time - Needs redirection for appropriate language or to initiate interaction.  Problem Solving Problem solving assist level: Solves basic 75 - 89% of the time/requires cueing 10 - 24% of the time  Memory Memory assist level: Recognizes or recalls 75 - 89% of the time/requires cueing 10 - 24% of the time   Medical Problem List and Plan: 1.Decreased functional mobility with apraxiasecondary to right SDH/EDHwith initial craniotomy evacuation hematoma 3/41/9622 complicated by epidural hematoma with evacuation 03/25/2018 and follow-up decompressive right frontal parietal craniectomy implantation of bone flap 03/28/2018.   Continue CIR  helmet for cranial protection  2. DVT Prophylaxis/Anticoagulation: SDH. Monitor for any signs of DVT 3. Pain Management:Hydrocodone and Fioricet as needed.  Added low dose topamax 25mg  qhs, increased to twice a day on 7/1, will consider further increase tomorrow  Propranolol 10 mg 3 times a day started on 7/3, increased to 20 3 times a day on 7/4 4. Mood:Provide emotional support 5. Neuropsych: This patientisnot fully capable of making decisions on hisown behalf. 6. Skin/Wound Care:Routine skin checks 7.  Fluids/Electrolytes/Nutrition:encourage PO 8.Seizure prophylaxis. Keppra 1000 mg every 12 hours as his seizure risk is high 9.Hyperlipidemia. Zocor 10.Constipation. Laxative assistance 11.? Reactive hypertension  Controlled on 7/4 12. Hypokalemia  Potassium 4.4 on 7/1  Supplement at 2 days on 6/29  Labs ordered for tomorrow 13.Hypoalbuminemia  Supplement initiated on 6/29 14. Leukocytosis: Resolved   WBCs 8.9 on 7/1  Labs ordered for tomorrow  Continue to monitor  Afebrile 15. Acute blood loss anemia  Hb 10.2 on 7/1  Labs ordered for tomorrow  Continue to monitor 16. Dental Carries  Oral gel ordered on 6/29  Improved 17. Hyperglycemia  Slightly elevated on 7/1  Likely stress-induced 18. Sleep disturbance  Melatonin started on 7/4  LOS (Days) 7 A FACE TO FACE EVALUATION WAS PERFORMED  Teige Rountree Lorie Phenix, MD 04/11/2018 8:55 AM

## 2018-04-11 NOTE — Progress Notes (Signed)
Speech Language Pathology Daily Session Note  Patient Details  Name: Barry Miranda MRN: [id number] Date of Birth: 05-05-1961  Today's Date: 04/11/2018 SLP Individual Time: 1005-1100 SLP Individual Time Calculation (min): 55 min  Short Term Goals: Week 1: SLP Short Term Goal 1 (Week 1): Pt will utilize external memory aides to recall new information with Max A cues.  SLP Short Term Goal 1 - Progress (Week 1): Revised due to lack of progress SLP Short Term Goal 2 (Week 1): Pt will demonstrate intellectual awareness identifying 2 physical and 2 cognitive deficits related to BI with Max A cues.  SLP Short Term Goal 2 - Progress (Week 1): Revised due to lack of progress SLP Short Term Goal 3 (Week 1): Pt will scan to left of midline to locate objects with Mod A cues.  SLP Short Term Goal 4 (Week 1): Pt will complete basic problem solving tasks with Max A cues.  SLP Short Term Goal 4 - Progress (Week 1): Revised due to lack of progress SLP Short Term Goal 5 (Week 1): Pt will demonstrate sustained attention for ~ 5 minutes with Max A.  SLP Short Term Goal 5 - Progress (Week 1): Revised due to lack of progress  Skilled Therapeutic Interventions:  Pt was seen for skilled ST targeting cognitive goals.  Pt's wife, daughters, mother, and niece were all present and remained actively engaged in training.  SLP discussed pt's progress and current goals in therapy.  Therapist provided skilled education regarding right brain dysfunction, emphasizing decreased safety awareness as a result of his decreased intellectual awareness of his deficits as well as visual scanning deficits.  SLP provided compensatory strategy instruction for improving awareness of obstacles on pt's left when ambulating as well as for memory.  Distraction management techniques were also discussed given pt's ongoing deficits for sustaining attention to tasks.  As a result of pt's cognitive deficits, therapist reiterated that pt needs 24/7  supervision at discharge in addition to [place] follow up at next level of care and assistance for medications and finances.  All questions were answered to pt's and family's satisfaction at this time.  Family was in agreement with recommended plan of care.   Following completion of family education, SLP facilitated the session with a novel card game targeting attention, problem solving, and recall of new information.  Pt initially needed mod-max assist to complete task; however, as pt became more familiar with task rules and format therapist was able to fade cues to min assist for planning and executing a problem solving strategy.  Pt sustained his attention to task for its duration (~15 min) with supervision cues for redirection.  Pt was left in the day room with family present.  Continue per current plan of care.    Function:  Eating Eating                 Cognition Comprehension Comprehension assist level: Follows basic conversation/direction with extra time/assistive device  Expression   Expression assist level: Expresses basic needs/ideas: With extra time/assistive device  Social Interaction Social Interaction assist level: Interacts appropriately 75 - 89% of the time - Needs redirection for appropriate language or to initiate interaction.  Problem Solving Problem solving assist level: Solves basic 75 - 89% of the time/requires cueing 10 - 24% of the time  Memory Memory assist level: Recognizes or recalls 75 - 89% of the time/requires cueing 10 - 24% of the time    Pain Pain Assessment Pain Scale: 0-10 Pain  Score: 0-No pain   Therapy/Group: Individual Therapy  [place] Paiz, Selinda Orion 04/11/2018, 2:55 PM

## 2018-04-11 NOTE — Progress Notes (Signed)
Physical Therapy Session Note  Patient Details  Name: Barry Miranda MRN: [id number] Date of Birth: 07-10-1961  Today's Date: 04/11/2018 PT Individual Time: 0900-0940 PT Individual Time Calculation (min): 40 min  and Today's Date: 04/11/2018 PT Missed Time: 20 Minutes Missed Time Reason: Pain;Patient fatigue  Short Term Goals: Week 1:  PT Short Term Goal 1 (Week 1): STG =LTG due to ELOS  Skilled Therapeutic Interventions/Progress Updates:   Pt in supine and agreeable to therapy w/ encouragement, c/o headache and fatigue. Family present for education on safe household and community mobility. Educated family on providing supervision level assist for household and community gait and 1 flight of stairs w/ R rail as per home set-up. Specifically discussed energy conservation techniques, balance impairments, and cognitive impairments affecting safe mobility including impaired sequencing, sustained attention, memory, and safety awareness. Instructed on floor transfer technique, pt able to perform w/ supervision and minimal verbal cues. Discussed building up to community mobility and gradually introducing more distracting environments such as loud restaurants and busy grocery stores. Wife and daughters present for all education and verbalized and demonstrated understanding. All in agreement that pt requires 24/7 supervision for both physical and cognitive deficits. Pt c/o increasing headache, agreeable to perform NuStep for LE strengthening. Performed 10 min @ level 3 while continuing skilled education regarding deficits listed above. Returned to room and ended session in recliner and in care of family, all needs met. Missed 20 min of skilled PT 2/2 headache/fatigue.   Therapy Documentation Precautions:  Precautions Precautions: Fall, Other (comment) Precaution Comments: R craniectomy with bone flap removed Restrictions Weight Bearing Restrictions: No General: PT Amount of Missed Time (min): 20  Minutes PT Missed Treatment Reason: Pain;Patient fatigue Vital Signs: Therapy Vitals Temp: 99.1 F (37.3 C) Temp Source: Oral Pulse Rate: 74 Resp: 18 BP: 120/69 Patient Position (if appropriate): Lying Oxygen Therapy SpO2: 100 % O2 Device: Room Air Pain: Pain Assessment Pain Scale: 0-10 Pain Score: 2  Pain Type: Chronic pain Pain Location: Head Pain Descriptors / Indicators: Headache Pain Frequency: Intermittent Pain Onset: On-going Patients Stated Pain Goal: 1 Pain Intervention(s): Medication (See eMAR)  See Function Navigator for Current Functional Status.   Therapy/Group: Individual Therapy  Devean Skoczylas K Arnette 04/11/2018, 9:58 AM

## 2018-04-11 NOTE — Progress Notes (Signed)
Physical Therapy Session Note  Patient Details  Name: Barry Miranda MRN: [id number] Date of Birth: December 12, 1960  Today's Date: 04/11/2018 PT Individual Time: 0511-0211 PT Individual Time Calculation (min): 30 min   Short Term Goals: Week 1:  PT Short Term Goal 1 (Week 1): STG =LTG due to ELOS  Skilled Therapeutic Interventions/Progress Updates:    Pt received supine in bed, agreeable to PT. Pt reports having a headache, not rated and pain medication not available for another hour. Bed mobility and transfers Supervision level throughout therapy session. Pt attempts to stand and walk out the door of his room prior to donning yellow non-slip socks and helmet, v/c to remind him to perform these prior to standing up out of bed. Pt then attempts to sit in w/c in his room that is not locked and has leg rests in the way, needs v/c for safety and to avoid obstacles. Ambulation 500 ft throughout therapy session with focus on environment awareness to avoid obstacles and safety hazards as well as integrating cognitive tasks naming animals and colors. Pt exhibits decreased gait speed and decreased attention to obstacles when asked to perform cognitive dual-tasking. Sidesteps with no UE support with Supervision, side-steps with ball toss with Supervision, attempt to add in cognitive task but pt struggles to multi-task and needs constant v/c to remind him of tasks he is to be performing. Standing ball kick with focus on dynamic standing balance. Pt left seated in w/c in room with family present to take him to dayroom for socialization.  Therapy Documentation Precautions:  Precautions Precautions: Fall, Other (comment) Precaution Comments: R craniectomy with bone flap removed Restrictions Weight Bearing Restrictions: No  See Function Navigator for Current Functional Status.   Therapy/Group: Individual Therapy  Excell Seltzer, PT, DPT  04/11/2018, 4:56 PM

## 2018-04-12 ENCOUNTER — Inpatient Hospital Stay (HOSPITAL_COMMUNITY): Payer: 59 | Admitting: Speech Pathology

## 2018-04-12 ENCOUNTER — Inpatient Hospital Stay (HOSPITAL_COMMUNITY): Payer: 59

## 2018-04-12 ENCOUNTER — Encounter (HOSPITAL_COMMUNITY): Payer: Self-pay

## 2018-04-12 ENCOUNTER — Inpatient Hospital Stay (HOSPITAL_COMMUNITY): Payer: 59 | Admitting: Physical Therapy

## 2018-04-12 DIAGNOSIS — G8918 Other acute postprocedural pain: Secondary | ICD-10-CM

## 2018-04-12 LAB — BASIC METABOLIC PANEL
ANION GAP: 7 (ref 5–15)
BUN: 18 mg/dL (ref 6–20)
CHLORIDE: 108 mmol/L (ref 98–111)
CO2: 25 mmol/L (ref 22–32)
CREATININE: 0.72 mg/dL (ref 0.61–1.24)
Calcium: 8.4 mg/dL — ABNORMAL LOW (ref 8.9–10.3)
GFR calc non Af Amer: >60 mL/min (ref >60)
Glucose, Bld: 113 mg/dL — ABNORMAL HIGH (ref 70–99)
Potassium: 3.7 mmol/L (ref 3.5–5.1)
SODIUM: 140 mmol/L (ref 135–145)

## 2018-04-12 LAB — CBC WITH DIFFERENTIAL/PLATELET
ABS IMMATURE GRANULOCYTES: 0.1 10*3/uL (ref 0.0–0.1)
BASOS ABS: 0.1 10*3/uL (ref 0.0–0.1)
Basophils Relative: 1 %
Eosinophils Absolute: 0.3 10*3/uL (ref 0.0–0.7)
Eosinophils Relative: 6 %
HEMATOCRIT: 32.9 % — AB (ref 39.0–52.0)
HEMOGLOBIN: 10.4 g/dL — AB (ref 13.0–17.0)
IMMATURE GRANULOCYTES: 1 %
LYMPHS ABS: 1.5 10*3/uL (ref 0.7–4.0)
LYMPHS PCT: 25 %
MCH: 30.9 pg (ref 26.0–34.0)
MCHC: 31.6 g/dL (ref 30.0–36.0)
MCV: 97.6 fL (ref 78.0–100.0)
Monocytes Absolute: 0.7 10*3/uL (ref 0.1–1.0)
Monocytes Relative: 11 %
NEUTROS ABS: 3.3 10*3/uL (ref 1.7–7.7)
NEUTROS PCT: 56 %
Platelets: 403 10*3/uL — ABNORMAL HIGH (ref 150–400)
RBC: 3.37 MIL/uL — AB (ref 4.22–5.81)
RDW: 15.3 % (ref 11.5–15.5)
WBC: 5.9 10*3/uL (ref 4.0–10.5)

## 2018-04-12 MED ORDER — SIMVASTATIN 20 MG PO TABS: 20.0000 mg | ORAL_TABLET | Freq: Every evening | ORAL | 0 refills | Status: AC

## 2018-04-12 MED ORDER — PROPRANOLOL HCL 10 MG PO TABS: 30.0000 mg | ORAL_TABLET | Freq: Three times a day (TID) | ORAL | 1 refills | Status: DC

## 2018-04-12 MED ORDER — TOPIRAMATE 50 MG PO TABS: 50.0000 mg | ORAL_TABLET | Freq: Every day | ORAL | 1 refills | Status: DC

## 2018-04-12 MED ORDER — PROPRANOLOL HCL 20 MG PO TABS
30.0000 mg | ORAL_TABLET | Freq: Three times a day (TID) | ORAL | Status: DC
  Administered 2018-04-12 – 2018-04-13 (×3): 30 mg via ORAL
  Filled 2018-04-12 (×3): qty 1

## 2018-04-12 MED ORDER — BUTALBITAL-APAP-CAFFEINE 50-325-40 MG PO TABS: 1.0000 | ORAL_TABLET | ORAL | 0 refills | Status: AC | PRN

## 2018-04-12 MED ORDER — MELATONIN 3 MG PO TABS
3.0000 mg | ORAL_TABLET | Freq: Every day | ORAL | Status: DC
  Administered 2018-04-12: 3 mg via ORAL
  Filled 2018-04-12: qty 1

## 2018-04-12 MED ORDER — TOPIRAMATE 25 MG PO TABS: 50.0000 mg | ORAL_TABLET | Freq: Every day | ORAL | Status: DC

## 2018-04-12 MED ORDER — MELATONIN 3 MG PO TABS: 3.0000 mg | ORAL_TABLET | Freq: Every day | ORAL | 0 refills | Status: DC

## 2018-04-12 MED ORDER — FAMOTIDINE 20 MG PO TABS: 20.0000 mg | ORAL_TABLET | Freq: Two times a day (BID) | ORAL | 1 refills | Status: DC

## 2018-04-12 MED ORDER — CHOLECALCIFEROL 50 MCG (2000 UT) PO TABS: 2000.0000 [IU] | ORAL_TABLET | Freq: Every day | ORAL | 0 refills | Status: AC

## 2018-04-12 MED ORDER — TOPIRAMATE 25 MG PO TABS: 25.0000 mg | ORAL_TABLET | Freq: Two times a day (BID) | ORAL | 1 refills | Status: DC

## 2018-04-12 MED ORDER — MELATONIN 3 MG PO TABS: 1.5000 mg | ORAL_TABLET | Freq: Every day | ORAL | 0 refills | Status: DC

## 2018-04-12 MED ORDER — LEVETIRACETAM 1000 MG PO TABS: 1000.0000 mg | ORAL_TABLET | Freq: Two times a day (BID) | ORAL | 2 refills | Status: DC

## 2018-04-12 MED ORDER — PROPRANOLOL HCL 20 MG PO TABS: 20.0000 mg | ORAL_TABLET | Freq: Three times a day (TID) | ORAL | 1 refills | Status: DC

## 2018-04-12 NOTE — Progress Notes (Signed)
Occupational Therapy Session Note  Patient Details  Name: Barry Miranda MRN: [id number] Date of Birth: 09/11/1961  Today's Date: 04/12/2018 OT Individual Time: 1050-1203 OT Individual Time Calculation (min): 73 min    Short Term Goals: Week 1:  OT Short Term Goal 1 (Week 1): STG=LTG d/t ELOS  Skilled Therapeutic Interventions/Progress Updates:    pt supine in bed agreeable to therapy with c/o headache as described below. Session focused on b/d tasks, dynamic standing balance, B UE strengthening, and safety awareness. Vc provided for donning of helmet prior to standing. Pt completed shower at mod I level in standing, washing UB/LB. Edu provided re safety and fall prevention during standing level dressing to encourage pt to dress in sitting. Vc provided for breathing technique. Pt experienced 1 LOB during distal reaching and standing on 1 foot. Pt resistant to correction from this OT re dynamic balance and safety awareness. Pt completed ~400 ft of functional mobility with no AE at (S)- mod I level overall throughout session. Pt completed obstacle course, with a focus on activity pacing, stepping over 5 in obstacle, and maneuvering in small spaces. Pt occasionally didn't clear obstacles but experienced no LOB. Pt practiced walk in shower transfer with mod I. Pt completed throwing activity standing on foam board, to address bilateral coordination and dynamic standing balance. Pt then completed 3 sets of a B UE circuit with a 5 lb dowel, requiring instruction for technique. Extensive discussion with pt re d/c planning throughout session. Pt returned to his room and was left sitting in recliner.   Therapy Documentation Precautions:  Precautions Precautions: Fall, Other (comment) Precaution Comments: R craniectomy with bone flap removed, helmet when OOB Restrictions Weight Bearing Restrictions: No Pain: Pain Assessment Pain Scale: 0-10 Pain Score: 4  Pain Type: Acute pain Pain Location: Head Pain  Orientation: Right Pain Descriptors / Indicators: Headache Pain Onset: On-going Pain Intervention(s): Ambulation/increased activity ADL: ADL ADL Comments: See functional navigator Vision Baseline Vision/History: Wears glasses Wears Glasses: At all times Patient Visual Report: No change from baseline Vision Assessment?: Yes Eye Alignment: Within Functional Limits Ocular Range of Motion: Restricted on the left Alignment/Gaze Preference: Within Defined Limits Tracking/Visual Pursuits: Decreased smoothness of eye movement to LEFT superior field Saccades: Additional eye shifts occurred during testing;Decreased speed of saccadic movement Perception  Perception: Within Functional Limits Praxis Praxis: Intact  See Function Navigator for Current Functional Status.   Therapy/Group: Individual Therapy  Curtis Sites 04/12/2018, 12:07 PM

## 2018-04-12 NOTE — Progress Notes (Signed)
Speech Language Pathology Discharge Summary  Patient Details  Name: Barry Miranda MRN: [id number] Date of Birth: 02-Nov-1960  Today's Date: 04/12/2018 SLP Individual Time: 0820-0900 SLP Individual Time Calculation (min): 40 min   Skilled Therapeutic Interventions:  Pt was seen for skilled ST targeting cognitive goals.  Pt was awake in bed and agreeable to getting up for therapies.  Pt requested to use the bathroom and needed supervision verbal cues to remind him to put on his helmet before getting out of bed.  Pt ambulated to bathroom with supervision cues for safety due to impulsivity.  Shortly after getting out of bed, pt requested to remove helmet while ambulating and needed another reminder of rationale behind wearing helmet when out of bed.  Pt agreeable to removing chin strap and completing oral care while seated as a safety precaution.  SLP re-administered the MoCA to measure progress from initial evaluation.  Pt scored significantly better on assessment today in comparison to initial evaluation (10/22 on initial evaluation, 18/30 on exit assessment) but continues to exhibit deficits in executive functioning and recall.  Pt was returned to room and left in bed with bed alarm set and call bell left within reach.  Pt is ready for discharge tomorrow.      Patient has met 5 of 5 long term goals.  Patient to discharge at overall Mod level.  Reasons goals not met:     Clinical Impression/Discharge Summary:   Pt has made functional gains while inpatient and is discharging having met 5 out of 5 short term goals.  Pt is currently mod assist for tasks due to moderate cognitive deficits s/p right brain dysfunction.  Pt and family education is complete at this time.  Pt has demonstrated improved awareness of deficits, orientation, and attention to tasks.  Pt is discharging home with 24/7 supervision and recommendations for ST follow up at next level of care.    Care Partner:  Caregiver Able to Provide  Assistance: Yes  Type of Caregiver Assistance: Physical;Cognitive  Recommendation:  Outpatient SLP;24 hour supervision/assistance  Rationale for SLP Follow Up: Maximize cognitive function and independence;Reduce caregiver burden   Equipment: none recommended by SLP    Reasons for discharge: Discharged from hospital   Patient/Family Agrees with Progress Made and Goals Achieved: Yes   Function:  Eating Eating                 Cognition Comprehension Comprehension assist level: Follows basic conversation/direction with extra time/assistive device  Expression   Expression assist level: Expresses basic needs/ideas: With extra time/assistive device  Social Interaction Social Interaction assist level: Interacts appropriately 90% of the time - Needs monitoring or encouragement for participation or interaction.  Problem Solving Problem solving assist level: Solves basic 75 - 89% of the time/requires cueing 10 - 24% of the time  Memory Memory assist level: Recognizes or recalls 50 - 74% of the time/requires cueing 25 - 49% of the time   Windell Moulding L 04/12/2018, 1:21 PM

## 2018-04-12 NOTE — Discharge Instructions (Signed)
Inpatient Rehab Discharge Instructions  JIRO KIESTER Discharge date and time: No discharge date for patient encounter.   Activities/Precautions/ Functional Status: Activity: Safety helmet for protection Diet: regular diet Wound Care: keep wound clean and dry Functional status:  ___ No restrictions     ___ Walk up steps independently ___ 24/7 supervision/assistance   ___ Walk up steps with assistance ___ Intermittent supervision/assistance  ___ Bathe/dress independently ___ Walk with walker     _x__ Bathe/dress with assistance ___ Walk Independently    ___ Shower independently ___ Walk with assistance    ___ Shower with assistance ___ No alcohol     ___ Return to work/school ________   COMMUNITY REFERRALS UPON DISCHARGE:    Outpatient: PT     OT    ST                   Agency:  [facility]     Phone: [phone]                Appointment Date/Time:  7/8  @ 11:45 am for speech therapy - please arrive by 11:30 am for paperwork                                                                  7/11  @ 9:30 - 11:00 for physical and occupational therapy   GENERAL COMMUNITY RESOURCES FOR PATIENT/FAMILY:  Support Groups:  [place] [facility]  (see handout)     Special Instructions: NO DRIVING   My questions have been answered and I understand these instructions. I will adhere to these goals and the provided educational materials after my discharge from the hospital.  Patient/Caregiver Signature _______________________________ Date __________  Clinician Signature _______________________________________ Date __________  Please bring this form and your medication list with you to all your follow-up doctor's appointments.

## 2018-04-12 NOTE — Discharge Summary (Signed)
Discharge summary job [phone]

## 2018-04-12 NOTE — Progress Notes (Signed)
Occupational Therapy Session Note  Patient Details  Name: Barry Miranda MRN: [id number] Date of Birth: [birth year]  Today's Date: 04/12/2018 OT Individual Time: 1430-1530 OT Individual Time Calculation (min): 60 min    Short Term Goals: Week 1:  OT Short Term Goal 1 (Week 1): STG=LTG d/t ELOS  Skilled Therapeutic Interventions/Progress Updates:    1:1. Pt missed 15 min skilled OT d/t finishing up meeting with PA. Pt completes 9HPT for final assessment of FMC/dexterity RUE 28.7 and LUE 30.7. Pt completes mini golf activity putting golf balls while standing on standard tile, compliant surface and foam wedge. Pt demo no LOB and able to retrieve golf balls off floor with MOD I. Pt sweeps kitchen floor in minimally inefficient pattern re-sweeping sections, however pt able to clear all dirt from floor and throw away without A. Pt plays ping pong with daughter and supervision for dynamic balance. Pt needs 1 VC for awareness of head nearing edge of table when gathering ping pong ball off of floor. Exited session with pt seated in recliner, daughters present and all needs met  Therapy Documentation Precautions:  Precautions Precautions: Fall, Other (comment) Precaution Comments: R craniectomy with bone flap removed, helmet when OOB Restrictions Weight Bearing Restrictions: No General:    See Function Navigator for Current Functional Status.   Therapy/Group: Individual Therapy  [name] 04/12/2018, 3:30 PM

## 2018-04-12 NOTE — Progress Notes (Signed)
Physical Therapy Discharge Summary  Patient Details  Name: Barry Miranda MRN: [id number] Date of Birth: [birth year]  Today's Date: 04/12/2018 PT Individual Time: 0915-0955 PT Individual Time Calculation (min): 40 min   Pt in supine and agreeable to therapy, denies headache pain this morning, states he just got medicine. No verbal reminders needed to don helmet prior to getting OOB. Session focused on performing functional mobility including gait in community and household environments, gait on uneven surfaces, furniture and car transfers, and bed mobility. Functional status as detailed below in discharge summary. Additionally performed Berg Balance Scale, scored 51/56. Explained significance of results to pt and reinforced need to have a caregiver present during upright, functional tasks for safety from both a physical and cognitive perspective. All family members are aware of this and have been trained on how to provide appropriate cognitive and physical assist. Pt was unable to describe why he needed supervision from family members despite max cues from therapist. Returned to room and ended session in supine, call bell within reach and all needs met.   Patient has met 9 of 9 long term goals due to improved activity tolerance, improved balance, improved postural control, increased strength, decreased pain, ability to compensate for deficits, improved attention, improved awareness and improved coordination.  Patient to discharge at an ambulatory level Supervision.   Patient's care partner is independent to provide the necessary physical and cognitive assistance at discharge.  Reasons goals not met: n/a  Recommendation:  Patient will benefit from ongoing skilled PT services in outpatient setting to continue to advance safe functional mobility, address ongoing impairments in functional balance, endurance, quality of gait, safety awareness, memory, and sequencing of functional tasks, and minimize fall  risk.  Equipment: No equipment provided  Reasons for discharge: treatment goals met and discharge from hospital  Patient/family agrees with progress made and goals achieved: Yes  PT Discharge Precautions/Restrictions Precautions Precautions: Fall;Other (comment) Precaution Comments: R craniectomy with bone flap removed, helmet when OOB Vital Signs Therapy Vitals Temp: 97.9 F (36.6 C) Temp Source: Oral Pulse Rate: 79 Resp: 18 BP: 121/69 Patient Position (if appropriate): Lying Oxygen Therapy SpO2: 99 % O2 Device: Room Air Vision/Perception  Perception Perception: Within Functional Limits Praxis Praxis: Intact  Cognition Overall Cognitive Status: Impaired/Different from baseline Arousal/Alertness: Awake/alert Orientation Level: Oriented X4 Attention: Sustained Sustained Attention: Appears intact Selective Attention: Impaired Memory: Impaired Memory Impairment: Decreased recall of new information Awareness: Impaired Problem Solving: Impaired Behaviors: Impulsive Safety/Judgment: Impaired Comments: Decreased awareness of deficits, needs frequent reminders of helmet use Sensation Sensation Light Touch: Appears Intact Proprioception: Appears Intact Coordination Gross Motor Movements are Fluid and Coordinated: Yes Fine Motor Movements are Fluid and Coordinated: No Motor  Motor Motor: Within Functional Limits Motor - Discharge Observations: generalized weakness  Mobility Bed Mobility Bed Mobility: Rolling Right;Rolling Left;Supine to Sit;Sit to Supine Rolling Right: Independent Rolling Left: Independent Supine to Sit: Independent Sit to Supine: Independent Transfers Transfers: Sit to Stand;Stand to Sit;Stand Pivot Transfers Sit to Stand: Independent with assistive device Stand to Sit: Independent with assistive device Stand Pivot Transfers: Independent with assistive device Transfer (Assistive device): None Locomotion  Gait Ambulation: Yes Gait  Assistance: Supervision/Verbal cueing Gait Distance (Feet): 150 Feet Assistive device: None Gait Gait: Yes Gait velocity: decreased Stairs / Additional Locomotion Stairs: Yes Stairs Assistance: Supervision/Verbal cueing Stair Management Technique: One rail Right Number of Stairs: 12 Height of Stairs: 8 Ramp: Supervision/Verbal cueing Curb: Supervision/Verbal cueing Wheelchair Mobility Wheelchair Mobility: No  Trunk/Postural Assessment  Cervical Assessment  Cervical Assessment: Within Functional Limits Thoracic Assessment Thoracic Assessment: Within Functional Limits Lumbar Assessment Lumbar Assessment: Within Functional Limits Postural Control Postural Control: Deficits on evaluation(delayed righting reactions)  Balance Balance Balance Assessed: Yes Standardized Balance Assessment Standardized Balance Assessment: Berg Balance Test Berg Balance Test Sit to Stand: Able to stand without using hands and stabilize independently Standing Unsupported: Able to stand safely 2 minutes Sitting with Back Unsupported but Feet Supported on Floor or Stool: Able to sit safely and securely 2 minutes Stand to Sit: Sits safely with minimal use of hands Transfers: Able to transfer safely, minor use of hands Standing Unsupported with Eyes Closed: Able to stand 10 seconds safely Standing Ubsupported with Feet Together: Able to place feet together independently and stand 1 minute safely From Standing, Reach Forward with Outstretched Arm: Can reach confidently >25 cm (10") From Standing Position, Pick up Object from Floor: Able to pick up shoe safely and easily From Standing Position, Turn to Look Behind Over each Shoulder: Looks behind one side only/other side shows less weight shift Turn 360 Degrees: Able to turn 360 degrees safely in 4 seconds or less Standing Unsupported, Alternately Place Feet on Step/Stool: Able to complete 4 steps without aid or supervision Standing Unsupported, One Foot in  Front: Able to plae foot ahead of the other independently and hold 30 seconds Standing on One Leg: Able to lift leg independently and hold 5-10 seconds Total Score: 51 Dynamic Sitting Balance Dynamic Sitting - Balance Support: No upper extremity supported;Feet supported Dynamic Sitting - Level of Assistance: 6: Modified independent (Device/Increase time) Static Standing Balance Static Standing - Level of Assistance: 5: Stand by assistance Dynamic Standing Balance Dynamic Standing - Level of Assistance: 5: Stand by assistance Extremity Assessment  RLE Assessment RLE Assessment: Within Functional Limits LLE Assessment LLE Assessment: Within Functional Limits   See Function Navigator for Current Functional Status.  Maximum Reiland K Arnette 04/12/2018, 9:55 AM

## 2018-04-12 NOTE — Progress Notes (Signed)
Discontinued staples from right side of patient's scalp with no c/o pain or discomfort. Patient resting comfortably in bed with daughters at the bedside.

## 2018-04-12 NOTE — Progress Notes (Signed)
Social Work  Discharge Note  The overall goal for the admission was met for:   Discharge location: Yes - home with wife and daughters to provide 24/7 supervision  Length of Stay: Yes - 9 days (with discharge on 04/13/18)  Discharge activity level: Yes - supervision  Home/community participation: Yes  Services provided included: MD, RD, PT, OT, SLP, RN, TR, Pharmacy, Neuropsych and SW  Financial Services: Private Insurance: [employer]  Follow-up services arranged: Outpatient: PT, OT, ST via [facility] and Patient/Family has no preference for HH/DME agencies  Comments (or additional information):  Patient/Family verbalized understanding of follow-up arrangements: Yes  Individual responsible for coordination of the follow-up plan: wife  Confirmed correct DME delivered: family to pick up tub seat on their own    Barry Miranda, Barry Miranda 

## 2018-04-12 NOTE — Progress Notes (Addendum)
[place] PHYSICAL MEDICINE & REHABILITATION     PROGRESS NOTE    Subjective/Complaints: Patient seen lying in bed this morning. He states he slept well overnight. He states his headaches are about the same. He has questions about discharge medications.  ROS: +headaches. Denies CP, SOB, N/V/D  Objective:  No results found. Recent Labs    04/12/18 0632  WBC 5.9  HGB 10.4*  HCT 32.9*  PLT 403*   Recent Labs    04/12/18 0632  NA 140  K 3.7  CL 108  GLUCOSE 113*  BUN 18  CREATININE 0.72  CALCIUM 8.4*   CBG (last 3)  No results for input(s): GLUCAP in the last 72 hours.  Wt Readings from Last 3 Encounters:  04/04/18 69.5 kg (153 lb 3.5 oz)  03/24/18 69.5 kg (153 lb 3.5 oz)  10/17/11 71.5 kg (157 lb 9.6 oz)     Intake/Output Summary (Last 24 hours) at 04/12/2018 0936 Last data filed at 04/11/2018 1700 Gross per 24 hour  Intake 480 ml  Output -  Net 480 ml    Vital Signs: Blood pressure 121/69, pulse 79, temperature 97.9 F (36.6 C), temperature source Oral, resp. rate 18, height 5\' 4"  (1.626 m), weight 69.5 kg (153 lb 3.5 oz), SpO2 99 %. Physical Exam:  Constitutional: No distress . Vital signs reviewed. HENT: Normocephalic.  Atraumatic. Poor dentition Eyes: EOMI. No discharge. Cardiovascular: RRR. No JVD.Marland Kitchen Respiratory: CTA bilaterally. Normal effort. GI: BS +. Non-distended. Musc: No edema or tenderness in extremities. Neurological: He isalert. Decreased insight and awareness.  Motor: LUE/LLE: 4/5 proximal to distal (stable) Skin: Skin iswarmand dry. 2 small blisters on right neck improving. Psychiatric:Flat   Assessment/Plan: 1. Functional deficits secondary to right SDH which require 3+ hours per day of interdisciplinary therapy in a comprehensive inpatient rehab setting. Physiatrist is providing close team supervision and 24 hour management of active medical problems listed below. Physiatrist and rehab team continue to assess barriers to  discharge/monitor patient progress toward functional and medical goals.  Function:  Bathing Bathing position Bathing activity did not occur: Refused Position: Shower  Bathing parts Body parts bathed by patient: Right arm, Left arm, Chest, Abdomen, Front perineal area, Buttocks, Right upper leg, Left upper leg, Left lower leg, Right lower leg    Bathing assist Assist Level: Supervision or verbal cues   Set up : To obtain items  Upper Body Dressing/Undressing Upper body dressing Upper body dressing/undressing activity did not occur: Refused What is the patient wearing?: Pull over shirt/dress     Pull over shirt/dress - Perfomed by patient: Thread/unthread right sleeve, Thread/unthread left sleeve, Put head through opening, Pull shirt over trunk          Upper body assist Assist Level: Set up      Lower Body Dressing/Undressing Lower body dressing Lower body dressing/undressing activity did not occur: Refused What is the patient wearing?: Underwear, Pants, Ted Hose, Non-skid slipper socks Underwear - Performed by patient: Thread/unthread right underwear leg, Thread/unthread left underwear leg, Pull underwear up/down   Pants- Performed by patient: Thread/unthread right pants leg, Thread/unthread left pants leg, Pull pants up/down   Non-skid slipper socks- Performed by patient: Don/doff right sock, Don/doff left sock               TED Hose - Performed by patient: Don/doff right TED hose, Don/doff left TED hose    Lower body assist Assist for lower body dressing: Supervision or verbal cues   Set up :  To obtain clothing/put away  Toileting Toileting   Toileting steps completed by patient: Adjust clothing prior to toileting, Performs perineal hygiene, Adjust clothing after toileting Toileting steps completed by helper: Adjust clothing prior to toileting, Performs perineal hygiene, Adjust clothing after toileting(per Berkley Harvey, NT)    Toileting assist Assist level:  Supervision or verbal cues   Transfers Chair/bed transfer   Chair/bed transfer method: Ambulatory Chair/bed transfer assist level: Supervision or verbal cues Chair/bed transfer assistive device: Armrests     Locomotion Ambulation     Max distance: >150' Assist level: Supervision or verbal cues   Wheelchair     Max wheelchair distance: 172ft  Assist Level: Touching or steadying assistance (Pt > 75%)  Cognition Comprehension Comprehension assist level: Follows basic conversation/direction with extra time/assistive device  Expression Expression assist level: Expresses basic needs/ideas: With extra time/assistive device  Social Interaction Social Interaction assist level: Interacts appropriately 75 - 89% of the time - Needs redirection for appropriate language or to initiate interaction.  Problem Solving Problem solving assist level: Solves basic 75 - 89% of the time/requires cueing 10 - 24% of the time  Memory Memory assist level: Recognizes or recalls 75 - 89% of the time/requires cueing 10 - 24% of the time   Medical Problem List and Plan: 1.Decreased functional mobility with apraxiasecondary to right SDH/EDHwith initial craniotomy evacuation hematoma 5/80/9983 complicated by epidural hematoma with evacuation 03/25/2018 and follow-up decompressive right frontal parietal craniectomy implantation of bone flap 03/28/2018.   Continue CIR, plan for DC tomorrow  Patient follow-up for transitional care management in 1-2 weeks post discharge  Will inquire about staple removal  helmet for cranial protection  2. DVT Prophylaxis/Anticoagulation: SDH. Monitor for any signs of DVT 3. Pain Management:Hydrocodone and Fioricet as needed.  Added low dose topamax 25mg  qhs, increased to twice a day on 7/1, increased to 50 at bedtime and 25 in the AM on 7/5  Propranolol 10 mg 3 times a day started on 7/3, increased to 20 3 times a day on 7/4, increased to 30 3 times a day on 7/5 4. Mood:Provide  emotional support 5. Neuropsych: This patientisnot fully capable of making decisions on hisown behalf. 6. Skin/Wound Care:Routine skin checks 7. Fluids/Electrolytes/Nutrition:encourage PO 8.Seizure prophylaxis. Keppra 1000 mg every 12 hours as his seizure risk is high 9.Hyperlipidemia. Zocor 10.Constipation. Laxative assistance 11.? Reactive hypertension  Controlled on 7/4 12. Hypokalemia  Potassium 3.7 on 7/5  Supplement at 2 days on 6/29  Will need ambulatory follow-up 13.Hypoalbuminemia  Supplement initiated on 6/29 14. Leukocytosis: Resolved   WBCs 5.9 on 7/5  Continue to monitor  Afebrile 15. Acute blood loss anemia  Hb 10.4 on 7/5  Continue to monitor 16. Dental Carries  Oral gel ordered on 6/29  Improved 17. Hyperglycemia  Slightly elevated on 7/5  Likely stress-induced 18. Sleep disturbance  Melatonin started on 7/4, increased on 7/5  LOS (Days) 8 A FACE TO FACE EVALUATION WAS PERFORMED  Analleli Gierke Lorie Phenix, MD 04/12/2018 9:36 AM

## 2018-04-12 NOTE — Discharge Summary (Signed)
Barry Miranda, [place] MEDICAL RECORD IN:[id number] ACCOUNT [id number] DATE OF BIRTH:05-Nov-1960 FACILITY: [facility] LOCATION: [facility] PHYSICIAN:ZACHARY SWARTZ, MD  DISCHARGE SUMMARY  DATE OF DISCHARGE:  04/13/2018  ADMIT DATE:  04/05/2018  DISCHARGE DATE:  04/13/2018  DISCHARGE DIAGNOSES: 1.  Right subdural hemorrhage -- extradural hemorrhage with initial craniotomy, evacuation 47/06/6282 complicated by epidural hematoma with evacuation with decompressive right frontoparietal craniectomy and implantation of bone flap 03/28/2018. 2.  Sequential compression devices for deep venous thrombosis prophylaxis. 3.  Pain management.   4.  Seizure prophylaxis.   5.  Hyperlipidemia. 6.  Constipation.   7.  Acute blood loss anemia.  HISTORY OF PRESENT ILLNESS:  This is a 57 year old right-handed male with history of hyperlipidemia, tobacco abuse who lives with his wife.  Independent prior to admission.  Presented 03/24/2018 with headache times several days, nausea and vomiting as  well as photophobia.  Denied any fall or trauma.  Cranial CT scan showed right subdural hematoma.  Per report, acute 1 cm right holohemispheric subdural hematoma resulting in right to left herniation with left lateral ventricle entrapment.  Underwent  craniotomy and evacuation 03/24/2018.  Postoperative lethargy.  CT demonstrated presence of acute epidural hematoma.  Underwent right frontotemporal craniotomy, evacuation 03/25/2018 per Dr. Ellene Route.  Remained intubated.  Did well until approximately 36  hours after latest surgery, progressive decline, repeat scan.  Continued approximately 12 mm right subdural hematoma with midline shift.  Again, returned to the operating room for decompressive right frontoparietal craniectomy, implantation of bone flap  and the subcutaneous pocket 03/28/2018 per Dr. Kathyrn Sheriff.  Maintained on Keppra for seizure prophylaxis.  EEG negative for seizure.  Ultimately extubated, completed course of 3% normal  saline and was admitted for comprehensive rehabilitation program.  PAST MEDICAL HISTORY:  See discharge diagnoses.  SOCIAL HISTORY:  Lives with wife.  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  FUNCTIONAL STATUS:  Upon admission to rehab services was minimal assist 2 feet, 2 person handheld assist, 2 person handheld assist equipment use, min mod assist with activities of daily living.  PHYSICAL EXAMINATION: VITAL SIGNS:  Blood pressure 163/77, pulse 98, temperature 99, respirations 16. GENERAL:  Alert male in no acute distress.  Craniotomy site clean and dry.  Staples intact.  Pupils are round and reactive to light. CARDIOVASCULAR:  Rate controlled. ABDOMEN:  Soft, nontender, good bowel sounds. LUNGS:  Clear to auscultation without wheeze. NEURO:  He provides the name of the hospital, his name and date of birth.  Mood was flat, but appropriate.  REHABILITATION HOSPITAL COURSE:  The patient was admitted to inpatient rehabilitation services.  Therapies initiated on a 3-hour daily basis, consisting of physical therapy, occupational therapy, speech therapy and rehabilitation nursing.  The following  issues were addressed during patient's rehabilitation stay.  Pertaining to the patient's craniectomy for subdural hematoma, epidural hematoma, he had undergone implantation of bone flap.  He would follow up with neurosurgery.  A helmet was in place for  cranial protection.  SCDs for DVT prophylaxis.  He was ambulatory.  Pain management with the use of hydrocodone and Fioricet.  He also continues on low dose propranolol as well as Topamax.  Blood pressure is controlled.  He was tolerating a regular diet.   Keppra for seizure prophylaxis.  EEG negative.  Acute blood loss anemia 10.2, stable and asymptomatic.  The patient received weekly collaborative interdisciplinary team conferences to discuss estimated length of stay, family teaching, any barriers to  discharge.  He was ambulating  500  feet throughout sessions focused on environment awareness, avoiding obstacles, safety hazards.  Can side step with no upper extremity support.  Up and down stairs with supervision.  Gathered belongings for activities of  daily living and homemaking, again discussing safety concerns and need for supervision for safety.  Speech therapy followup again for safety and awareness.  Compensatory strategy instructions for improving awareness.  I again reiterated the need for  24-hour supervision for safety.  The patient could communicate his full needs.  He was discharged to home.  DISCHARGE MEDICATIONS:  Included vitamin D 2000 units p.o. daily, Colace 100 mg p.o. b.i.d., Pepcid 20 mg p.o. b.i.d., Keppra 1000 mg p.o. b.i.d., melatonin 1.5 mg p.o. at bedtime, Inderal 20 mg p.o. t.i.d., Topamax 25 mg p.o. b.i.d., Fioricet 1 tablet  every 4 hours as needed headache.    His diet was regular.  The patient would follow up with Dr. Alger Simons at the outpatient rehab service office as directed; Dr. Kristeen Miss, call for appointment; Dr. Lennette Bihari Via, medical management.  SPECIAL INSTRUCTIONS:  No driving.  Continue helmet for protection.  TN/NUANCE D:04/12/2018 T:04/12/2018 JOB:[id number]

## 2018-04-12 NOTE — Progress Notes (Signed)
Occupational Therapy Discharge Summary  Patient Details  Name: Barry Miranda MRN: [id number] Date of Birth: [birth year]    Patient has met 12 of 12 long term goals due to improved activity tolerance, improved balance, postural control, ability to compensate for deficits, functional use of  LEFT upper and LEFT lower extremity, improved attention, improved awareness and improved coordination.  Patient to discharge at overall Modified Independent level.  Patient's care partner is independent to provide the necessary cognitive assistance at discharge.    Reasons goals not met: N/A  Recommendation:  Patient would benefit from OPOT to further improve deficits in IADLs  Equipment: Pt's family plans to privately purchase shower chair  Reasons for discharge: treatment goals met and discharge from hospital  Patient/family agrees with progress made and goals achieved: Yes   OT Discharge Precautions/Restrictions  Precautions Precautions: Fall;Other (comment) Precaution Comments: R craniectomy with bone flap removed, helmet when OOB Restrictions Weight Bearing Restrictions: No  Pain Pain Assessment Pain Scale: 0-10 Pain Score: 4  Pain Type: Acute pain Pain Location: Head Pain Orientation: Right Pain Descriptors / Indicators: Headache Pain Onset: On-going Pain Intervention(s): Ambulation/increased activity ADL ADL ADL Comments: See functional navigator Vision Baseline Vision/History: Wears glasses Wears Glasses: At all times Patient Visual Report: No change from baseline Vision Assessment?: Yes Eye Alignment: Within Functional Limits Ocular Range of Motion: Restricted on the left Alignment/Gaze Preference: Within Defined Limits Tracking/Visual Pursuits: Decreased smoothness of eye movement to LEFT superior field Saccades: Additional eye shifts occurred during testing;Decreased speed of saccadic movement Perception  Perception: Within Functional Limits Praxis Praxis:  Intact Cognition Overall Cognitive Status: Impaired/Different from baseline Arousal/Alertness: Awake/alert Orientation Level: Oriented X4 Attention: Sustained Sustained Attention: Appears intact Selective Attention: Impaired Selective Attention Impairment: Verbal basic;Functional basic Memory: Impaired Memory Impairment: Decreased recall of new information Awareness: Impaired Problem Solving: Impaired Problem Solving Impairment: Verbal basic;Functional basic Behaviors: Impulsive Safety/Judgment: Impaired Comments: Decreased awareness of deficits, needs frequent reminders of helmet use Sensation Sensation Light Touch: Impaired Detail Light Touch Impaired Details: Impaired RUE;Impaired LUE(pt reports numbness/tingling in B hands but sensation intact on assessment) Proprioception: Appears Intact Coordination Gross Motor Movements are Fluid and Coordinated: Yes Fine Motor Movements are Fluid and Coordinated: No(decreased coordination) Finger Nose Finger Test: decreased speed but no under/over shooting Motor  Motor Motor: Within Functional Limits Motor - Discharge Observations: generalized weakness Mobility  Bed Mobility Bed Mobility: Rolling Right;Rolling Left;Supine to Sit;Sit to Supine Rolling Right: Independent Rolling Left: Independent Supine to Sit: Independent Sit to Supine: Independent Transfers Sit to Stand: Independent with assistive device Stand to Sit: Independent with assistive device  Trunk/Postural Assessment  Cervical Assessment Cervical Assessment: Within Functional Limits Thoracic Assessment Thoracic Assessment: Within Functional Limits Lumbar Assessment Lumbar Assessment: Within Functional Limits Postural Control Postural Control: Within Functional Limits  Balance Balance Balance Assessed: Yes Standardized Balance Assessment Standardized Balance Assessment: Berg Balance Test Berg Balance Test Sit to Stand: Able to stand without using hands and  stabilize independently Standing Unsupported: Able to stand safely 2 minutes Sitting with Back Unsupported but Feet Supported on Floor or Stool: Able to sit safely and securely 2 minutes Stand to Sit: Sits safely with minimal use of hands Transfers: Able to transfer safely, minor use of hands Standing Unsupported with Eyes Closed: Able to stand 10 seconds safely Standing Ubsupported with Feet Together: Able to place feet together independently and stand 1 minute safely From Standing, Reach Forward with Outstretched Arm: Can reach confidently >25 cm (10") From Standing Position, Pick up Object  from Floor: Able to pick up shoe safely and easily From Standing Position, Turn to Look Behind Over each Shoulder: Looks behind one side only/other side shows less weight shift Turn 360 Degrees: Able to turn 360 degrees safely in 4 seconds or less Standing Unsupported, Alternately Place Feet on Step/Stool: Able to complete 4 steps without aid or supervision Standing Unsupported, One Foot in Front: Able to plae foot ahead of the other independently and hold 30 seconds Standing on One Leg: Able to lift leg independently and hold 5-10 seconds Total Score: 51 Dynamic Sitting Balance Dynamic Sitting - Balance Support: No upper extremity supported;Feet supported Dynamic Sitting - Level of Assistance: 6: Modified independent (Device/Increase time) Dynamic Sitting - Balance Activities: Reaching for objects Static Standing Balance Static Standing - Level of Assistance: 5: Stand by assistance Dynamic Standing Balance Dynamic Standing - Level of Assistance: 5: Stand by assistance Dynamic Standing - Balance Activities: Reaching across midline;Lateral lean/weight shifting Extremity/Trunk Assessment RUE Assessment RUE Assessment: Exceptions to [facility] General Strength Comments: overall 4/5 LUE Assessment LUE Assessment: Exceptions to [facility] General Strength Comments: overall 4/5   See Function Navigator for Current  Functional Status.  Curtis Sites 04/12/2018, 11:23 AM

## 2018-04-13 NOTE — Progress Notes (Signed)
Barry Miranda is a 57 y.o. male Jun 29, 1961 [id number]  Subjective: No new complaints. No new problems. Slept well. Feeling OK.  Objective: Vital signs in last 24 hours: Temp:  [97.6 F (36.4 C)-98.9 F (37.2 C)] 97.6 F (36.4 C) (07/06 0509) Pulse Rate:  [63-81] 63 (07/06 0509) Resp:  [17-18] 17 (07/06 0509) BP: (120-125)/(69-78) 125/78 (07/06 0509) SpO2:  [98 %-100 %] 98 % (07/06 0509) Weight change:  Last BM Date: 04/11/18  Intake/Output from previous day: 07/05 0701 - 07/06 0700 In: 960 [P.O.:960] Out: -  Last cbgs: CBG (last 3)  No results for input(s): GLUCAP in the last 72 hours.   Physical Exam General: No apparent distress   HEENT: not dry Lungs: Normal effort. Lungs clear to auscultation, no crackles or wheezes. Cardiovascular: Regular rate and rhythm, no edema Abdomen: S/NT/ND; BS(+) Musculoskeletal:  unchanged Neurological: No new neurological deficits Wounds: N/A    Skin: clear   Mental state: Alert, oriented, cooperative    Lab Results: BMET    Component Value Date/Time   NA 140 04/12/2018 0632   K 3.7 04/12/2018 0632   CL 108 04/12/2018 0632   CO2 25 04/12/2018 0632   GLUCOSE 113 (H) 04/12/2018 0632   BUN 18 04/12/2018 0632   CREATININE 0.72 04/12/2018 0632   CALCIUM 8.4 (L) 04/12/2018 0632   GFRNONAA >60 04/12/2018 0632   GFRAA >60 04/12/2018 0632   CBC    Component Value Date/Time   WBC 5.9 04/12/2018 0632   RBC 3.37 (L) 04/12/2018 0632   HGB 10.4 (L) 04/12/2018 0632   HCT 32.9 (L) 04/12/2018 0632   PLT 403 (H) 04/12/2018 0632   MCV 97.6 04/12/2018 0632   MCH 30.9 04/12/2018 0632   MCHC 31.6 04/12/2018 0632   RDW 15.3 04/12/2018 0632   LYMPHSABS 1.5 04/12/2018 0632   MONOABS 0.7 04/12/2018 0632   EOSABS 0.3 04/12/2018 0632   BASOSABS 0.1 04/12/2018 0632    Studies/Results: No results found.  Medications: I have reviewed the patient's current medications.  Assessment/Plan:   1. Right SDH/EDHwith initial craniotomy  evacuation hematoma 2/68/3419 complicated by epidural hematoma with evacuation 03/25/2018 and follow-up decompressive right frontal parietal craniectomy implantation of bone flap 03/28/2018. Completed CIR. D/c home 2. Pain - Norco, Fioricet prn 3. Seizure prophylaxix - Keppra 4. Constipation - LOC 5. Hypokalemia - corrected 6. Caries - OP treatment 7. Anemia - monitor CBC OP   Length of stay, days: 9  Walker Kehr , MD 04/13/2018, 8:20 AM

## 2018-04-13 NOTE — Progress Notes (Signed)
Patient discharged about 1030. Bone flap incision to abdomen discussed with MD Posey Pronto and MD Burnice Logan. Order received to d/c staples. Staples removed and incision clean and dry. Advised family about signs of infection and when to call MD. Patient and family verbalized understanding. All discharge instructions given yesterday.

## 2018-04-15 ENCOUNTER — Telehealth: Payer: Self-pay | Admitting: Registered Nurse

## 2018-04-15 ENCOUNTER — Other Ambulatory Visit: Payer: Self-pay

## 2018-04-15 ENCOUNTER — Ambulatory Visit: Payer: 59 | Attending: Physical Medicine & Rehabilitation | Admitting: Speech Pathology

## 2018-04-15 DIAGNOSIS — I69018 Other symptoms and signs involving cognitive functions following nontraumatic subarachnoid hemorrhage: Secondary | ICD-10-CM | POA: Insufficient documentation

## 2018-04-15 DIAGNOSIS — R41841 Cognitive communication deficit: Secondary | ICD-10-CM | POA: Diagnosis present

## 2018-04-15 DIAGNOSIS — R29818 Other symptoms and signs involving the nervous system: Secondary | ICD-10-CM | POA: Diagnosis not present

## 2018-04-15 NOTE — Telephone Encounter (Signed)
Transitional Care call  Transitional Care Call Questions Answered by Ms. Lowell Guitar  Patient name: Barry Miranda DOB: 02/04/1961 1. Are you/is patient experiencing any problems since coming home? No a. Are there any questions regarding any aspect of care? No 2. Are there any questions regarding medications administration/dosing? No a. Are meds being taken as prescribed? Yes b. "Patient should review meds with caller to confirm" Medication List Reviewed 3. Have there been any falls? No 4. Has Home Health been to the house and/or have they contacted you? [facility] a. If not, have you tried to contact them? No b. Can we help you contact them? NA 5. Are bowels and bladder emptying properly? Yes a. Are there any unexpected incontinence issues? No b. If applicable, is patient following bowel/bladder programs? NA 6. Any fevers, problems with breathing, unexpected pain? [name] still having headaches she reports, he's taking medication as prescribed and the medications are helping the headache per Mrs. Alfonse Spruce. 7. Are there any skin problems or new areas of breakdown? No 8. Has the patient/family member arranged specialty MD follow up (ie cardiology/neurology/renal/surgical/etc.)?  Yes a. Can we help arrange? NA 9. Does the patient need any other services or support that we can help arrange? No 10. Are caregivers following through as expected in assisting the patient? Yes 11. Has the patient quit smoking, drinking alcohol, or using drugs as recommended? Mrs. Pelissier reports [name] doesn't smoke, drink alcohol or use illicit drugs.  Appointment date/time 04/24/2018, arrival time 2:20 for 2:40 appointment with Danella Sensing ANP. At [place]

## 2018-04-16 NOTE — Therapy (Signed)
[facility] [address] [place], [place], [address] Phone: [phone]   Fax:  [phone]  Speech Language Pathology Evaluation  Patient Details  Name: Barry Miranda MRN: [id number] Date of Birth: 1961/06/07 Referring Provider: Dr. Naaman Plummer    Encounter Date: 04/15/2018  End of Session - 04/15/18 1145    Visit Number  1    Number of Visits  17    Date for SLP Re-Evaluation  06/14/18    SLP Start Time  0923    SLP Stop Time   1230    SLP Time Calculation (min)  45 min    Activity Tolerance  Patient tolerated treatment well       Past Medical History:  Diagnosis Date  . Hyperlipidemia     Past Surgical History:  Procedure Laterality Date  . CHOLECYSTECTOMY  2012  . CRANIECTOMY FOR DEPRESSED SKULL FRACTURE Right 03/28/2018   Procedure: CRANIECTOMY for cerebral edema with Placement of Skull Flap in Abdomen;  Surgeon: Consuella Lose, MD;  Location: [facility];  Service: Neurosurgery;  Laterality: Right;  . CRANIOTOMY Right 03/24/2018   Procedure: CRANIOTOMY, HEMATOMA EVACUATION SUBDURAL, RIGHT SIDE;  Surgeon: Erline Levine, MD;  Location: [facility];  Service: Neurosurgery;  Laterality: Right;  . CRANIOTOMY Right 03/24/2018   Procedure: CRANIOTOMY HEMATOMA EVACUATION EPIDURAL;  Surgeon: Kristeen Miss, MD;  Location: [facility];  Service: Neurosurgery;  Laterality: Right;  . LIPOMA EXCISION     lipoma on forehead   . WISDOM TOOTH EXTRACTION      There were no vitals filed for this visit.  Subjective Assessment - 04/15/18 1154    Subjective  "I don't think it's different." re: thinking/memory    Currently in Pain?  No/denies         SLP Evaluation [facility] - 04/15/18 1154      SLP Visit Information   SLP Received On  04/15/18    Referring Provider  Dr. Naaman Plummer     Onset Date  03/24/18    Medical Diagnosis  R SDH      General Information   HPI  57 year old male with history of hyperlipidemia, presented to the ED with  headache on 03/24/18.  CT showing large R SDH in much of the frontotemporoparietal regions.  Pt s/p crani and drain placement. Pt with increased lethargy on 6/19; repeat CT on 6/20 with 1.70mm midline shift increase. He additionally underwent decompressive right frontoparietal craniectomy and implantation of bone flap 03/28/18.     Behavioral/Cognition  flat affect    Mobility Status  ambulated to session      Balance Screen   Has the patient fallen in the past 6 months  -- PT eval on Thurs      Prior Functional Status   Cognitive/Linguistic Baseline  Within functional limits    Baseline deficit details  Skilled ST in CIR found moderate cognitive deficits in problem solving, selective attention, impulsivity, left attention, intellectual awareness, delayed processing and recall of new information. Cues required for engagement. MOCA 18/30 on exit assessment from CIR.    Type of Home  House     Lives With  Spouse;Daughter    Available Support  Family    Education  vocational school    Vocation  Full time employment      Pain Assessment   Pain Assessment  No/denies pain      Cognition   Overall Cognitive Status  Impaired/Different from baseline    Area of Impairment  Attention;Memory;Safety/judgement;Awareness;Problem  solving    Current Attention Level  Sustained    Memory  Decreased recall of precautions;Decreased short-term memory    Memory Comments  forgot to strap helmet when getting up to walk to ST room    Safety/Judgement  Decreased awareness of safety;Decreased awareness of deficits    Problem Solving  Slow processing;Difficulty sequencing    Attention  Selective;Alternating    Selective Attention  Impaired    Selective Attention Impairment  Verbal basic;Functional basic    Alternating Attention  Impaired    Alternating Attention Impairment  Verbal basic;Functional basic    Memory  Impaired    Memory Impairment  Storage deficit;Decreased recall of new information;Decreased short  term memory impacted by impaired attention    Decreased Short Term Memory  Verbal basic    Awareness  Impaired    Awareness Impairment  Intellectual impairment;Emergent impairment;Anticipatory impairment    Problem Solving  Impaired    Problem Solving Impairment  Verbal basic;Functional basic    Scientist, water quality Comprehension   Overall Auditory Comprehension  Appears within functional limits for tasks assessed slow processing, attention impacts comprehension;    Yes/No Questions  Within Functional Limits    Commands  Within Functional Limits    Conversation  Simple    Interfering Components  Attention;Processing speed    EffectiveTechniques  Extra processing time;Repetition      Visual Recognition/Discrimination   Discrimination  Not tested      Reading Comprehension   Reading Status  Not tested      Expression   Primary Mode of Expression  Verbal      Verbal Expression   Overall Verbal Expression  Appears within functional limits for tasks assessed    Automatic Speech  Name;Social Response    Level of Generative/Spontaneous Verbalization  Conversation      Written Expression   Dominant Hand  Right    Written Expression  Not tested      Oral Motor/Sensory Function   Overall Oral Motor/Sensory Function  Appears within functional limits for tasks assessed      Motor Speech   Overall Motor Speech  Appears within functional limits for tasks assessed    Respiration  Within functional limits    Phonation  Normal    Resonance  Within functional limits    Articulation  Within functional limitis    Intelligibility  Intelligible    Motor Planning  Witnin functional limits    Motor Speech Errors  Not applicable      Standardized Assessments   Standardized Assessments   Cognitive Linguistic Quick Test      Cognitive Linguistic Quick Test (Ages 18-69)   Attention  Mild    Memory  Moderate    Executive Function  Severe     Language  Mild    Visuospatial Skills  Mild    Severity Rating Total  12    Composite Severity Rating  9.6                        SLP Short Term Goals - 04/15/18 1145      SLP SHORT TERM GOAL #1   Title  pt will tell two cognitive-linguistic deficits to SLP, over two sessions    Time  4    Period  Weeks    Status  New      SLP SHORT TERM GOAL #2   Title  pt will demo  sustained attention for 10 minutes of therapy tasks in quiet environment x3 sessions     Time  4    Period  Weeks    Status  New      SLP SHORT TERM GOAL #3   Title  Pt will perform min complex organization/planning/sequencing/finance tasks with 85% accuracy over 3 sessions     Time  4    Period  Weeks    Status  New      SLP SHORT TERM GOAL #4   Title  pt will initiate functional use of a memory enhancing system/device     Time  4    Period  Weeks    Status  New       SLP Long Term Goals - 04/15/18 1145      SLP LONG TERM GOAL #1   Title  pt will demo knowledge of deficits by self-correcting 90% of errors in ST over 4 sessions    Time  8    Period  Weeks    Status  New      SLP LONG TERM GOAL #2   Title  pt will demo 15 minutes selective attention to ST tasks in mod noisy environment    Time  8    Period  Weeks    Status  New      SLP LONG TERM GOAL #3   Title  Pt will complete mod complex organization/planning/sequencing/finance tasks with 90% success over three sessions     Time  8    Period  Weeks    Status  New      SLP LONG TERM GOAL #4   Title  pt will use memory strategies/system in or between 5 sessions     Time  8    Period  Weeks    Status  New       Plan - 04/15/18 1314    Clinical Impression Statement  Patient presents with moderate cognitive communication impairment, with deficits in sustained and higher level attention, executive function, memory, problem solving and awareness. Affect flat, pt impulsive, attempting to take pen from SLP to begin a task while  instruction was still being provided. Slow processing, decreased task persistence also noted. When confronted with errors on CLQT, pt stated, "My brain isn't working right today, I'm tired." No family present for today's evaluation. I recommend skilled ST to maximize cognition for safety, to decrease caregiver burden and increase independence.     Speech Therapy Frequency  2x / week    Duration  -- 8 weeks or 16 additional visits    Treatment/Interventions  Environmental controls;Cognitive reorganization;Compensatory strategies;Compensatory techniques;Cueing hierarchy;Internal/external aids;Functional tasks;SLP instruction and feedback;Patient/family education    Potential to Achieve Goals  Good    Consulted and Agree with Plan of Care  Patient       Patient will benefit from skilled therapeutic intervention in order to improve the following deficits and impairments:   Cognitive communication deficit    Problem List Patient Active Problem List   Diagnosis Date Noted  . Postoperative pain   . Sleep disturbance   . Hyperglycemia   . Vascular headache   . Executive function deficit   . Labile blood pressure   . Hypokalemia   . Hypoalbuminemia due to protein-calorie malnutrition ([facility])   . Leukocytosis   . Dental caries   . Midline shift of brain   . Subdural bleeding ([place])   . Hyperlipidemia   . Tobacco abuse   .  Sinus tachycardia   . Reactive hypertension   . Hypernatremia   . Acute blood loss anemia   . Encounter for intubation   . Epidural hematoma ([facility]) 03/25/2018  . Acute respiratory failure ([place] [facility])   . Non-traumatic acute subdural hematoma ([place]) 03/24/2018  . Subdural hematoma ([place]) 03/24/2018  . Chronic calculus cholecystitis 09/11/2011   Deneise Lever, [place], [place] Speech-Language Pathologist  Aliene Altes 04/16/2018, 8:22 AM  [facility] [address] [place], [place], [address] Phone: [phone]    Fax:  [phone]  Name: Barry Miranda MRN: [id number] Date of Birth: [birth year]

## 2018-04-18 ENCOUNTER — Ambulatory Visit: Payer: 59 | Admitting: Occupational Therapy

## 2018-04-18 ENCOUNTER — Encounter: Payer: Self-pay | Admitting: Physical Therapy

## 2018-04-18 ENCOUNTER — Ambulatory Visit: Payer: 59 | Admitting: Physical Therapy

## 2018-04-18 ENCOUNTER — Encounter: Payer: Self-pay | Admitting: Occupational Therapy

## 2018-04-18 ENCOUNTER — Other Ambulatory Visit (HOSPITAL_COMMUNITY): Payer: Self-pay | Admitting: Neurosurgery

## 2018-04-18 VITALS — BP 106/61 | HR 70

## 2018-04-18 DIAGNOSIS — I69018 Other symptoms and signs involving cognitive functions following nontraumatic subarachnoid hemorrhage: Secondary | ICD-10-CM

## 2018-04-18 DIAGNOSIS — R29818 Other symptoms and signs involving the nervous system: Secondary | ICD-10-CM

## 2018-04-18 DIAGNOSIS — S065X9A Traumatic subdural hemorrhage with loss of consciousness of unspecified duration, initial encounter: Secondary | ICD-10-CM

## 2018-04-18 DIAGNOSIS — R41841 Cognitive communication deficit: Secondary | ICD-10-CM | POA: Diagnosis not present

## 2018-04-18 DIAGNOSIS — S065XAA Traumatic subdural hemorrhage with loss of consciousness status unknown, initial encounter: Secondary | ICD-10-CM

## 2018-04-18 NOTE — Therapy (Signed)
[facility] [address] [place], [place], [address] Phone: [phone]   Fax:  [phone]  Occupational Therapy Evaluation  Patient Details  Name: Barry Miranda MRN: [id number] Date of Birth: 1960/11/04 Referring Provider: Dr. Alger Miranda   Encounter Date: 04/18/2018  OT End of Session - 04/18/18 1059    Visit Number  1    Number of Visits  1    Date for OT Re-Evaluation  -- n/a    OT Start Time  0933    OT Stop Time  1010    OT Time Calculation (min)  37 min    Activity Tolerance  Patient tolerated treatment well       Past Medical History:  Diagnosis Date  . Hyperlipidemia     Past Surgical History:  Procedure Laterality Date  . CHOLECYSTECTOMY  2012  . CRANIECTOMY FOR DEPRESSED SKULL FRACTURE Right 03/28/2018   Procedure: CRANIECTOMY for cerebral edema with Placement of Skull Flap in Abdomen;  Surgeon: Consuella Lose, MD;  Location: [facility];  Service: Neurosurgery;  Laterality: Right;  . CRANIOTOMY Right 03/24/2018   Procedure: CRANIOTOMY, HEMATOMA EVACUATION SUBDURAL, RIGHT SIDE;  Surgeon: Erline Levine, MD;  Location: [facility];  Service: Neurosurgery;  Laterality: Right;  . CRANIOTOMY Right 03/24/2018   Procedure: CRANIOTOMY HEMATOMA EVACUATION EPIDURAL;  Surgeon: Kristeen Miss, MD;  Location: [facility];  Service: Neurosurgery;  Laterality: Right;  . LIPOMA EXCISION     lipoma on forehead   . WISDOM TOOTH EXTRACTION      There were no vitals filed for this visit.  Subjective Assessment - 04/18/18 0935    Subjective   I don't have a headache right now - I took some medication    Patient is accompained by:  Family member daughter Barry Miranda    Pertinent History  R SDH with craniotomy 03/24/2018    Patient Stated Goals  I don't see any problems - just my finger on my left hand is a little senstitive    Currently in Pain?  No/denies        [facility] OT Assessment - 04/18/18 0001      Assessment   Medical  Diagnosis  R SDH, s/p craniotomy    Referring Provider  Dr.Swartz    Onset Date/Surgical Date  03/24/18    Hand Dominance  Right    Prior Therapy  inpt rehab for PT, OT and ST      Precautions   Precautions  Fall    Precaution Comments  Helmet at all times due craniotomy with skull flap placement.       Restrictions   Weight Bearing Restrictions  No      Balance Screen   Has the patient fallen in the past 6 months  No      Home  Environment   Family/patient expects to be discharged to:  Private residence    Living Arrangements  Spouse/significant other wife and 2 dtrs - 14, 67    Available Help at Discharge  Available 24 hours/day    Type of [place]  Two level    Bathroom Shower/Tub  Public librarian    Additional Comments  Pt has no equipment in bathroom inpt suggested shower seat but pt/family refused      Prior Function   Level of Independence  Independent    Vocation  Full time employment    Biomedical scientist  Dealer    Leisure  mow the yard, cooking      ADL   Eating/Feeding  Independent    Grooming  Supervision/safety    Upper Body Bathing  Supervision/safety    Lower Body Bathing  Supervision/safety    Radiation protection practitioner - Astronomer -  Control and instrumentation engineer  Supervision/safety    ADL comments  Pt with craniotomy and skull flap in abdomen - supervision for self care primarily because pt has helmet off to bathe and dress - needs one cue for initiation and then dtr reports pt is able to complete.        IADL   Shopping  Assistance for transportation;Needs to be accompanied on any shopping trip    Light Housekeeping  -- pt did outside work before not doing currently    Meal Prep  -- pt can get drink; supervision to warm meal    Community Mobility   Relies on family or friends for transportation    Medication Management  Is not capable of dispensing or managing own medication    Financial Management  Dependent      Mobility   Mobility Status  Needs assist    Mobility Status Comments  supervision when walking in community;       Written Expression   Dominant Hand  Right      Vision - History   Baseline Vision  Wears glasses all the time    Additional Comments  Pt denies any visual changes      Activity Tolerance   Activity Tolerance  Tolerate 30+ min activity without fatigue      Cognition   Overall Cognitive Status  Impaired/Different from baseline    Area of Impairment  Attention;Memory;Safety/judgement;Awareness;Problem solving    Current Attention Level  Sustained    Safety/Judgement  Decreased awareness of deficits Pt could state today that he needs his helmet     Problem Solving  Slow processing    Attention  Selective;Alternating    Selective Attention  Impaired    Selective Attention Impairment  Verbal basic dtr reports pt can attend for self care     Alternating Attention  Impaired    Alternating Attention Impairment  Verbal basic;Functional basic    Memory  Impaired    Memory Impairment  Storage deficit;Decreased recall of new information;Decreased short term memory impacted by attention    Decreased Short Term Memory  Verbal basic    Awareness  Impaired    Awareness Impairment  Intellectual impairment;Emergent impairment;Anticipatory impairment all impaired    Problem Solving  Impaired    Executive Function  Initiating    Cognition Comments  Dtr reports that family has to give pt cue to initiate self care activities and then pt can sequence and complete.  Dtr also states pt is slow to process info and that this is also then impacted by language barrier (pt reports he does not speak English as well as his daughters and did ask for clarification of certain words several times during eval).  Dtr reports that now pt  will "forget at first but when we remind him then he can recall things."       Sensation   Light Touch  Appears Intact    Hot/Cold  Appears Intact    Proprioception  Appears Intact      Coordination   Gross Motor Movements are Fluid and Coordinated  Yes pt states it feels a little tight "I am cold"    Fine Motor Movements are Fluid and Coordinated  Yes slightly slowed likely due more to cognition      Tone   Assessment Location  Right Upper Extremity;Left Upper Extremity      ROM / Strength   AROM / PROM / Strength  AROM;Strength      AROM   Overall AROM   Within functional limits for tasks performed    Overall AROM Comments  BUE's      Strength   Overall Strength  Within functional limits for tasks performed    Overall Strength Comments  BUE's      Hand Function   Right Hand Gross Grasp  Functional    Right Hand Grip (lbs)  55    Left Hand Gross Grasp  Functional    Left Hand Grip (lbs)  52      RUE Tone   RUE Tone  Within Functional Limits      LUE Tone   LUE Tone  Within Functional Limits                           OT Long Term Goals - 04/18/18 1052      OT LONG TERM GOAL #1   Title  n/a            Plan - 04/18/18 1052    Clinical Impression Statement  Pt is a 57 year old male s/p R SDH with craniotomy on 11/02/5807 with complicated medical course.  Pt was discharged home with 24 hour supervison on 04/12/2018 after inpt rehab stay.  Pt today presents with the following impairments that impact pt's independence:  impaired cognition.  PT to evaluate higher level balance today. Pt requires initial prompt for basic self care however then can carry out per pt and family. Pt has limited benefits and ST is currently addressing cognition and pt has PT eval today. In light of this do not recommend OT follow up at this time.  ST and PT aware and pt and daughter in agreement with plan.    Occupational Profile and client history currently impacting  functional performance  husband, father, employee, friend. PMH:  HLD    Occupational performance deficits (Please refer to evaluation for details):  ADL's;IADL's;Work;Leisure;Social Participation    Rehab Potential  -- n/a    Plan  fo further follow up for OT - see note abovve    Consulted and Agree with Plan of Care  Patient;Family member/caregiver    Family Member Consulted  daughter       Patient will benefit from skilled therapeutic intervention in order to improve the following deficits and impairments:  Decreased balance, Decreased cognition  Visit Diagnosis: Other symptoms and signs involving cognitive functions following nontraumatic subarachnoid hemorrhage    Problem List Patient Active Problem List   Diagnosis Date Noted  . Postoperative pain   . Sleep disturbance   . Hyperglycemia   . Vascular headache   . Executive function deficit   . Labile blood pressure   . Hypokalemia   . Hypoalbuminemia due to protein-calorie malnutrition ([facility])   . Leukocytosis   . Dental caries   . Midline shift of brain   . Subdural bleeding ([place])   . Hyperlipidemia   . Tobacco abuse   .  Sinus tachycardia   . Reactive hypertension   . Hypernatremia   . Acute blood loss anemia   . Encounter for intubation   . Epidural hematoma ([place]) 03/25/2018  . Acute respiratory failure ([place])   . Non-traumatic acute subdural hematoma ([place]) 03/24/2018  . Subdural hematoma ([place]) 03/24/2018  . Chronic calculus cholecystitis 09/11/2011    Quay Burow, OTR/L 04/18/2018, 1:07 PM  [facility] [address] [place], [place], [address] Phone: [phone]   Fax:  [phone]  Name: Barry Miranda MRN: [id number] Date of Birth: 1961-07-11

## 2018-04-19 NOTE — Therapy (Signed)
[facility] [address] [place], [place], [address] Phone: [phone]   Fax:  [phone]  Physical Therapy Evaluation  Patient Details  Name: Barry Miranda MRN: [id number] Date of Birth: April 17, 1961 Referring Provider: Dr. Alger Simons   Encounter Date: 04/18/2018  PT End of Session - 04/19/18 1055    Visit Number  1    Number of Visits  1    Authorization Type  [employer]     Authorization Time Period  VL combined PT/OT/ST: 5    Authorization - Visit Number  2    Authorization - Number of Visits  60    PT Start Time  1016    PT Stop Time  1056    PT Time Calculation (min)  40 min    Activity Tolerance  Patient tolerated treatment well    Behavior During Therapy  [facility] for tasks assessed/performed       Past Medical History:  Diagnosis Date  . Hyperlipidemia     Past Surgical History:  Procedure Laterality Date  . CHOLECYSTECTOMY  2012  . CRANIECTOMY FOR DEPRESSED SKULL FRACTURE Right 03/28/2018   Procedure: CRANIECTOMY for cerebral edema with Placement of Skull Flap in Abdomen;  Surgeon: Consuella Lose, MD;  Location: [facility];  Service: Neurosurgery;  Laterality: Right;  . CRANIOTOMY Right 03/24/2018   Procedure: CRANIOTOMY, HEMATOMA EVACUATION SUBDURAL, RIGHT SIDE;  Surgeon: Erline Levine, MD;  Location: [facility];  Service: Neurosurgery;  Laterality: Right;  . CRANIOTOMY Right 03/24/2018   Procedure: CRANIOTOMY HEMATOMA EVACUATION EPIDURAL;  Surgeon: Kristeen Miss, MD;  Location: [facility];  Service: Neurosurgery;  Laterality: Right;  . LIPOMA EXCISION     lipoma on forehead   . WISDOM TOOTH EXTRACTION      Vitals:   04/18/18 1020  BP: 106/61  Pulse: 70     Subjective Assessment - 04/18/18 1022    Subjective  Pt began to have multiple headaches, went to hospital and was diagnosed with SDH requiring evacuation and craniotomy.  Pt went to inpatient rehab and then home, no HH therapies.  Pt "It's good to be at  home".  Pt denies any issues with walking or balance.  Pt also denies changes in strength.       Patient is accompained by:  Family member    Pertinent History  hyperlipidemia, tobacco abuse    Patient Stated Goals  "I don't know"    Currently in Pain?  No/denies         [facility] PT Assessment - 04/18/18 1028      Assessment   Medical Diagnosis  R SDH, s/p craniotomy    Referring Provider  Dr. Alger Simons    Onset Date/Surgical Date  03/24/18    Hand Dominance  Right    Prior Therapy  inpt rehab for PT, OT and ST      Precautions   Precautions  Fall    Precaution Comments  Helmet at all times due craniotomy with skull flap placement; hyperlipidemia, tobacco abuse      Balance Screen   Has the patient fallen in the past 6 months  No      [place] residence    Living Arrangements  Spouse/significant other    Type of [place] Access  Level entry    [place]  Two level;Bed/bath upstairs    Alternate Level Stairs-Number of Steps  12  Alternate Level Stairs-Rails  Right;Left    Home Equipment  None      Prior Function   Level of Independence  Independent    Vocation  Full time employment    Network engineer    Leisure  mow the yard, cooking      Sensation   Light Touch  Appears Intact    Hot/Cold  Appears Intact    Proprioception  Appears Intact      Coordination   Gross Motor Movements are Fluid and Coordinated  Yes    Heel Shin Test  Mildly delayed RLE      Tone   Assessment Location  Right Lower Extremity;Left Lower Extremity      ROM / Strength   AROM / PROM / Strength  Strength      Strength   Overall Strength  Within functional limits for tasks performed      Transfers   Transfers  Floor to Transfer    Floor to Transfer  7: Independent      Ambulation/Gait   Ambulation/Gait  Yes    Ambulation/Gait Assistance  6: Modified independent (Device/Increase time)    Ambulation Distance  (Feet)  500 Feet    Assistive device  None    Gait Pattern  Step-through pattern;Poor foot clearance - left;Poor foot clearance - right slide on sandals    Ambulation Surface  Unlevel;Outdoor;Paved    Stairs  Yes    Stairs Assistance  6: Modified independent (Device/Increase time)    Stair Management Technique  No rails;Alternating pattern;Forwards    Number of Stairs  12    Height of Stairs  6    Gait Comments  During gait with dual task: naming objects pt demonstrated slightly decreased gait speed      Standardized Balance Assessment   Standardized Balance Assessment  Berg Balance Test;Dynamic Gait Index;Five Times Sit to Stand    Five times sit to stand comments   8 seconds without UE      Berg Balance Test   Sit to Stand  Able to stand without using hands and stabilize independently    Standing Unsupported  Able to stand safely 2 minutes    Sitting with Back Unsupported but Feet Supported on Floor or Stool  Able to sit safely and securely 2 minutes    Stand to Sit  Sits safely with minimal use of hands    Transfers  Able to transfer safely, minor use of hands    Standing Unsupported with Eyes Closed  Able to stand 10 seconds safely    Standing Ubsupported with Feet Together  Able to place feet together independently and stand 1 minute safely    From Standing, Reach Forward with Outstretched Arm  Can reach confidently >25 cm (10")    From Standing Position, Pick up Object from Floor  Able to pick up shoe safely and easily    From Standing Position, Turn to Look Behind Over each Shoulder  Looks behind from both sides and weight shifts well    Turn 360 Degrees  Able to turn 360 degrees safely in 4 seconds or less    Standing Unsupported, Alternately Place Feet on Step/Stool  Able to stand independently and safely and complete 8 steps in 20 seconds    Standing Unsupported, One Foot in Front  Able to place foot tandem independently and hold 30 seconds    Standing on One Leg  Able to lift  leg independently and hold >  10 seconds    Total Score  56      Dynamic Gait Index   Level Surface  Normal    Change in Gait Speed  Normal    Gait with Horizontal Head Turns  Normal    Gait with Vertical Head Turns  Normal    Gait and Pivot Turn  Normal    Step Over Obstacle  Normal    Step Around Obstacles  Normal    Steps  Normal    Total Score  24    DGI comment:  24/24      RLE Tone   RLE Tone  Mild      LLE Tone   LLE Tone  Within Functional Limits                Objective measurements completed on examination: See above findings.              PT Education - 04/19/18 1053    Education Details  clinical findings: difficulty with dual tasking and safety recommendations.  No impairments found requiring outpatient PT at this time.  Pt agreeable    Person(s) Educated  Patient;Child(ren)    Methods  Explanation    Comprehension  Verbalized understanding       PT Short Term Goals - 04/19/18 1058      PT SHORT TERM GOAL #1   Title  N/A eval only        PT Long Term Goals - 04/19/18 1058      PT LONG TERM GOAL #1   Title  N/A - eval only             Plan - 04/19/18 1058    Clinical Impression Statement  Pt is a 57 year old male referred to Neuro OPPT for evaluation s/p subdural hematoma with inpatient rehabilitation.  Pt's PMH is significant for the following: tobacco abuse and hyperlipidemia.  At this time no significant impairments or physical limitations were found that could be addressed with outpatient PT. Patient's BERG score and FGA were WFL and did not indicate increased risk for falls.  Pt's main deficit is related to higher level cognition and will continue with speech therapy.  Pt seen for one time visit/eval only.    History and Personal Factors relevant to plan of care:  independent, worked as Dealer prior to CVA.  Tobacco abuse, hyperlipidemia.  Pt's main impairments are cognition    Clinical Presentation  Stable    Clinical  Presentation due to:  independent, worked as Dealer prior to CVA.  Tobacco abuse, hyperlipidemia.  Pt's main impairments are cognition    Clinical Decision Making  Low    PT Frequency  One time visit    PT Next Visit Plan  No PT needed at this time - pt to continue with Speech    Consulted and Agree with Plan of Care  Patient       Patient will benefit from skilled therapeutic intervention in order to improve the following deficits and impairments:  Decreased cognition  Visit Diagnosis: Other symptoms and signs involving the nervous system     Problem List Patient Active Problem List   Diagnosis Date Noted  . Postoperative pain   . Sleep disturbance   . Hyperglycemia   . Vascular headache   . Executive function deficit   . Labile blood pressure   . Hypokalemia   . Hypoalbuminemia due to protein-calorie malnutrition ([place])   . Leukocytosis   .  Dental caries   . Midline shift of brain   . Subdural bleeding ([place])   . Hyperlipidemia   . Tobacco abuse   . Sinus tachycardia   . Reactive hypertension   . Hypernatremia   . Acute blood loss anemia   . Encounter for intubation   . Epidural hematoma ([place]) 03/25/2018  . Acute respiratory failure ([place])   . Non-traumatic acute subdural hematoma ([place]) 03/24/2018  . Subdural hematoma ([place]) 03/24/2018  . Chronic calculus cholecystitis 09/11/2011    Rico Junker, PT, DPT 04/19/18    12:48 PM    [facility] [address] [place], [place], [address] Phone: [phone]   Fax:  [phone]  Name: Barry Miranda MRN: [id number] Date of Birth: 1961/02/13

## 2018-04-22 ENCOUNTER — Ambulatory Visit: Payer: 59 | Admitting: Speech Pathology

## 2018-04-22 DIAGNOSIS — R41841 Cognitive communication deficit: Secondary | ICD-10-CM | POA: Diagnosis not present

## 2018-04-22 NOTE — Therapy (Signed)
[facility] [address] [place], [place], [address] Phone: [phone]   Fax:  [phone]  Speech Language Pathology Treatment  Patient Details  Name: Barry Miranda MRN: [id number] Date of Birth: 09/04/1961 Referring Provider: Dr. Naaman Plummer    Encounter Date: 04/22/2018  End of Session - 04/22/18 1124    Visit Number  2    Number of Visits  17    Date for SLP Re-Evaluation  06/14/18    SLP Start Time  1105    SLP Stop Time   1145    SLP Time Calculation (min)  40 min    Activity Tolerance  Patient tolerated treatment well       Past Medical History:  Diagnosis Date  . Hyperlipidemia     Past Surgical History:  Procedure Laterality Date  . CHOLECYSTECTOMY  2012  . CRANIECTOMY FOR DEPRESSED SKULL FRACTURE Right 03/28/2018   Procedure: CRANIECTOMY for cerebral edema with Placement of Skull Flap in Abdomen;  Surgeon: Consuella Lose, MD;  Location: [facility];  Service: Neurosurgery;  Laterality: Right;  . CRANIOTOMY Right 03/24/2018   Procedure: CRANIOTOMY, HEMATOMA EVACUATION SUBDURAL, RIGHT SIDE;  Surgeon: Erline Levine, MD;  Location: [facility];  Service: Neurosurgery;  Laterality: Right;  . CRANIOTOMY Right 03/24/2018   Procedure: CRANIOTOMY HEMATOMA EVACUATION EPIDURAL;  Surgeon: Kristeen Miss, MD;  Location: [facility];  Service: Neurosurgery;  Laterality: Right;  . LIPOMA EXCISION     lipoma on forehead   . WISDOM TOOTH EXTRACTION      There were no vitals filed for this visit.  Subjective Assessment - 04/22/18 1108    Subjective  "My language is a little bit off but I can do math no problem."    Currently in Pain?  No/denies            ADULT SLP TREATMENT - 04/22/18 1105      General Information   Behavior/Cognition  Alert;Cooperative      Treatment Provided   Treatment provided  Cognitive-Linquistic      Cognitive-Linquistic Treatment   Treatment focused on  Cognition    Skilled Treatment  SLP  targeted problem solving and error awareness with simple cognitive linguistic tasks (adding 4 coin combinations). Pt with slow processing (average 3 minutes per problem) and decreased error awareness (66% accuracy initially; no awareness of errors). When pt confronted with errors/slow processing, pt initially explained away deficits. SLP told pt that he would likely have been able to add coin combinations in his head in less than 30 seconds prior to his brain injury, and pt agreed. Usual mod A required to ID errors. Pt stated, "I guess I am having trouble focusing." While leaving ST room pt asked if SLP had any plumbing blueprints to work on, stating he felt he would be able to read blueprints and perform his work better than he did with therapy tasks today. SLP educated that pt's cognitive deficits would also impact him due to alternating attention skills required to shift between reading blueprints and performing his work.        Assessment / Recommendations / Plan   Plan  Continue with current plan of care      Progression Toward Goals   Progression toward goals  Progressing toward goals       SLP Education - 04/22/18 1123    Education Details  deficits: decreased attention, slow processing, error awareness    Person(s) Educated  Patient    Methods  Explanation;Demonstration  Comprehension  Need further instruction;Verbal cues required       SLP Short Term Goals - 04/22/18 1126      SLP SHORT TERM GOAL #1   Title  pt will tell two cognitive-linguistic deficits to SLP, over two sessions    Period  Weeks    Status  On-going      SLP SHORT TERM GOAL #2   Title  pt will demo sustained attention for 10 minutes of therapy tasks in quiet environment x3 sessions     Time  4    Period  Weeks    Status  On-going      SLP SHORT TERM GOAL #3   Title  Pt will perform min complex organization/planning/sequencing/finance tasks with 85% accuracy over 3 sessions     Time  4    Period  Weeks     Status  On-going      SLP SHORT TERM GOAL #4   Title  pt will initiate functional use of a memory enhancing system/device     Time  4    Period  Weeks    Status  On-going       SLP Long Term Goals - 04/22/18 1126      SLP LONG TERM GOAL #1   Title  pt will demo knowledge of deficits by self-correcting 90% of errors in ST over 4 sessions    Time  8    Period  Weeks    Status  On-going      SLP LONG TERM GOAL #2   Title  pt will demo 15 minutes selective attention to ST tasks in mod noisy environment    Time  8    Period  Weeks    Status  On-going      SLP LONG TERM GOAL #3   Title  Pt will complete mod complex organization/planning/sequencing/finance tasks with 90% success over three sessions     Time  8    Period  Weeks    Status  On-going      SLP LONG TERM GOAL #4   Title  pt will use memory strategies/system in or between 5 sessions     Time  8    Period  Weeks    Status  On-going       Plan - 04/22/18 1124    Clinical Impression Statement  Patient presents with moderate cognitive communication impairment, with deficits in sustained and higher level attention, executive function, memory, problem solving and awareness. Slow processing and decreased insight also seen today. Pt explaining away deficits, verbal and demonstration cues required for error awareness; pt ultimately did acknowledge he probably would have had an easier time completing session tasks premorbidly. I recommend skilled ST to maximize cognition for safety, to decrease caregiver burden and increase independence.     Speech Therapy Frequency  2x / week    Treatment/Interventions  Environmental controls;Cognitive reorganization;Compensatory strategies;Compensatory techniques;Cueing hierarchy;Internal/external aids;Functional tasks;SLP instruction and feedback;Patient/family education    Potential to Achieve Goals  Good    Consulted and Agree with Plan of Care  Patient       Patient will benefit from  skilled therapeutic intervention in order to improve the following deficits and impairments:   Cognitive communication deficit    Problem List Patient Active Problem List   Diagnosis Date Noted  . Postoperative pain   . Sleep disturbance   . Hyperglycemia   . Vascular headache   . Executive function deficit   .  Labile blood pressure   . Hypokalemia   . Hypoalbuminemia due to protein-calorie malnutrition ([place])   . Leukocytosis   . Dental caries   . Midline shift of brain   . Subdural bleeding ([place])   . Hyperlipidemia   . Tobacco abuse   . Sinus tachycardia   . Reactive hypertension   . Hypernatremia   . Acute blood loss anemia   . Encounter for intubation   . Epidural hematoma ([place]) 03/25/2018  . Acute respiratory failure ([place])   . Non-traumatic acute subdural hematoma ([place]) 03/24/2018  . Subdural hematoma ([place]) 03/24/2018  . Chronic calculus cholecystitis 09/11/2011   Deneise Lever, [place], [place] 04/22/2018, 12:58 PM  [facility] [address] [place], [place], [address] Phone: [phone]   Fax:  [phone]   Name: Barry Miranda MRN: [id number] Date of Birth: Jun 07, 1961

## 2018-04-22 NOTE — Patient Instructions (Signed)
    Get a binder for therapy to organize your papers and therapy schedule and bring it with you to each session.   Memory Compensation Strategies  1. Use "WARM" strategy. W= write it down A=  associate it R=  repeat it M=  make a mental picture  2. You can keep a Social worker. Use a 3-ring notebook with sections for the following:  calendar, important names and phone numbers, medications, doctors' names/phone numbers, "to do list"/reminders, and a section to journal what you did each day  3. Use a calendar to write appointments down.  4. Write yourself a schedule for the day.  This can be placed on the calendar or in a separate section of the Memory Notebook.  Keeping a regular schedule can help memory.  5. Use medication organizer with sections for each day or morning/evening pills  You may need help loading it  6. Keep a basket, or pegboard by the door.   Place items that you need to take out with you in the basket or on the pegboard.  You may also want to include a message board for reminders.  7. Use sticky notes. Place sticky notes with reminders in a place where the task is performed.  For example:  "turn off the stove" placed by the stove, "lock the door" placed on the door at eye level, "take your medications" on the bathroom mirror or by the place where you normally take your medications  8. Use alarms/timers.  Use while cooking to remind yourself to check on food or as a reminder to take your medicine, or as a reminder to make a call, or as a reminder to perform another task, etc.  9. Use a small tape recorder to record important information and notes for yourself.

## 2018-04-24 ENCOUNTER — Encounter: Payer: Self-pay | Admitting: Registered Nurse

## 2018-04-24 ENCOUNTER — Encounter: Payer: 59 | Attending: Registered Nurse | Admitting: Registered Nurse

## 2018-04-24 VITALS — BP 106/70 | HR 68 | Resp 14 | Ht 64.5 in | Wt 147.0 lb

## 2018-04-24 DIAGNOSIS — F17210 Nicotine dependence, cigarettes, uncomplicated: Secondary | ICD-10-CM | POA: Insufficient documentation

## 2018-04-24 DIAGNOSIS — I6200 Nontraumatic subdural hemorrhage, unspecified: Secondary | ICD-10-CM | POA: Diagnosis not present

## 2018-04-24 DIAGNOSIS — S065X9A Traumatic subdural hemorrhage with loss of consciousness of unspecified duration, initial encounter: Secondary | ICD-10-CM | POA: Insufficient documentation

## 2018-04-24 DIAGNOSIS — X58XXXA Exposure to other specified factors, initial encounter: Secondary | ICD-10-CM | POA: Diagnosis not present

## 2018-04-24 DIAGNOSIS — G479 Sleep disorder, unspecified: Secondary | ICD-10-CM | POA: Insufficient documentation

## 2018-04-24 DIAGNOSIS — G441 Vascular headache, not elsewhere classified: Secondary | ICD-10-CM

## 2018-04-24 DIAGNOSIS — Z298 Encounter for other specified prophylactic measures: Secondary | ICD-10-CM

## 2018-04-24 DIAGNOSIS — E785 Hyperlipidemia, unspecified: Secondary | ICD-10-CM | POA: Insufficient documentation

## 2018-04-24 MED ORDER — PROPRANOLOL HCL 10 MG PO TABS: 30.0000 mg | ORAL_TABLET | Freq: Three times a day (TID) | ORAL | 1 refills | Status: DC

## 2018-04-24 NOTE — Progress Notes (Signed)
Subjective:    Patient ID: Barry Miranda, male    DOB: [birth year], 57 y.o.   MRN: [id number]  HPI: [name] is a 56 year old male who is here for transitional care visit in follow up on his subdural bleeding, vascular headache, sleep disturbance and seizure prophylaxis. He denies any pain. Also reports he has a fair appetite.  Crani incision clean dry and intact, wearing helmet.   Daughter in room, all questions answered.    Pain Inventory Average Pain 2 Pain Right Now 2 My pain is dull  In the last 24 hours, has pain interfered with the following? General activity 2 Relation with others 2 Enjoyment of life 2 What TIME of day is your pain at its worst? night Sleep (in general) Fair  Pain is worse with: unsure Pain improves with: rest and medication Relief from Meds: 10  Mobility walk without assistance how many minutes can you walk? 30 ability to climb steps?  yes do you drive?  yes transfers alone Do you have any goals in this area?  yes  Function disabled: date disabled . I need assistance with the following:  bathing  Neuro/Psych No problems in this area  Prior Studies transitional care  Physicians involved in your care transitional care   Family History  Problem Relation Age of Onset  . Hypertension Mother   . Diabetes Mother    Social History   Socioeconomic History  . Marital status: Married    Spouse name: Not on file  . Number of children: Not on file  . Years of education: Not on file  . Highest education level: Not on file  Occupational History  . Not on file  Social Needs  . Financial resource strain: Not on file  . Food insecurity:    Worry: Not on file    Inability: Not on file  . Transportation needs:    Medical: Not on file    Non-medical: Not on file  Tobacco Use  . Smoking status: Current Every Day Smoker    Packs/day: 0.15  . Smokeless tobacco: Never Used  Substance and Sexual Activity  . Alcohol use: Yes   Alcohol/week: 4.2 oz    Types: 7 Cans of beer per week  . Drug use: No  . Sexual activity: Yes  Lifestyle  . Physical activity:    Days per week: Not on file    Minutes per session: Not on file  . Stress: Not on file  Relationships  . Social connections:    Talks on phone: Not on file    Gets together: Not on file    Attends religious service: Not on file    Active member of club or organization: Not on file    Attends meetings of clubs or organizations: Not on file    Relationship status: Not on file  Other Topics Concern  . Not on file  Social History Narrative  . Not on file   Past Surgical History:  Procedure Laterality Date  . CHOLECYSTECTOMY  2012  . CRANIECTOMY FOR DEPRESSED SKULL FRACTURE Right 03/28/2018   Procedure: CRANIECTOMY for cerebral edema with Placement of Skull Flap in Abdomen;  Surgeon: Consuella Lose, MD;  Location: [facility];  Service: Neurosurgery;  Laterality: Right;  . CRANIOTOMY Right 03/24/2018   Procedure: CRANIOTOMY, HEMATOMA EVACUATION SUBDURAL, RIGHT SIDE;  Surgeon: Erline Levine, MD;  Location: [facility];  Service: Neurosurgery;  Laterality: Right;  . CRANIOTOMY Right 03/24/2018   Procedure:  CRANIOTOMY HEMATOMA EVACUATION EPIDURAL;  Surgeon: Kristeen Miss, MD;  Location: [facility];  Service: Neurosurgery;  Laterality: Right;  . LIPOMA EXCISION     lipoma on forehead   . WISDOM TOOTH EXTRACTION     Past Medical History:  Diagnosis Date  . Hyperlipidemia    BP 106/70 (BP Location: Left Arm, Patient Position: Sitting, Cuff Size: Normal)   Pulse 68   Resp 14   Ht 5' 4.5" (1.638 m)   Wt 147 lb (66.7 kg)   SpO2 97%   BMI 24.84 kg/m   Opioid Risk Score:   Fall Risk Score:  `1  Depression screen PHQ 2/9  Depression screen PHQ 2/9 04/24/2018  Decreased Interest 0  Down, Depressed, Hopeless 0  PHQ - 2 Score 0  Altered sleeping 2  Tired, decreased energy 1  Change in appetite 2  Feeling bad or failure about yourself  0  Trouble concentrating 0    Moving slowly or fidgety/restless 0  Suicidal thoughts 0  PHQ-9 Score 5  Difficult doing work/chores Not difficult at all    Review of Systems  Constitutional: Negative.   HENT: Negative.   Eyes: Negative.   Respiratory: Negative.   Cardiovascular: Negative.   Gastrointestinal: Negative.   Endocrine: Negative.   Genitourinary: Negative.   Musculoskeletal: Negative.   Skin: Negative.   Neurological: Positive for headaches.  Hematological: Negative.   Psychiatric/Behavioral: Negative.        Objective:   Physical Exam  Constitutional: He is oriented to person, place, and time. He appears well-developed and well-nourished.  HENT:  Head: Normocephalic and atraumatic.  Neck: Normal range of motion. Neck supple.  Cardiovascular: Normal rate and regular rhythm.  Pulmonary/Chest: Effort normal and breath sounds normal.  Musculoskeletal:  Normal Muscle Bulk and Muscle Testing Reveals:  Upper Extremities: Full ROM and Muscle Strength 4/5 Lower Extremities: Full ROM and Muscle Strength 5/5 Arises from Table with Ease Gait is Steady with Normal Steps  Neurological: He is alert and oriented to person, place, and time.  Skin: Skin is warm and dry.  Nursing note and vitals reviewed.         Assessment & Plan:  1. Subdural Bleeding: Continue with outpatient Neuro Rehabilitation. Repeat CT scheduled for 05/14/2018. Has a follow up appointment scheduled with Dr. Vertell Limber. Wearing Helmet for cranial protection. 2. Vascular Headache: Continue Propranolol and Topamax. 3. Seizure Prophylaxis: Continue Keppra 4. Sleep Disturbance: Continue Melatonin.   30 minutes of face to face patient care time was spent during this visit. All questions were encouraged and answered.  F/U with Dr. Naaman Plummer in 4-6 weeks

## 2018-04-26 ENCOUNTER — Ambulatory Visit: Payer: 59

## 2018-04-26 DIAGNOSIS — R41841 Cognitive communication deficit: Secondary | ICD-10-CM | POA: Diagnosis not present

## 2018-04-26 NOTE — Therapy (Signed)
[facility] [address] [place], [place], [address] Phone: [phone]   Fax:  [phone]  Speech Language Pathology Treatment  Patient Details  Name: Barry Miranda MRN: [id number] Date of Birth: [birth year] Referring Provider: Dr. Naaman Plummer    Encounter Date: 04/26/2018  End of Session - 04/26/18 1544    Visit Number  3    Number of Visits  17    Date for SLP Re-Evaluation  06/14/18    SLP Start Time  1451 pt 5 mintues late    SLP Stop Time   1532    SLP Time Calculation (min)  41 min    Activity Tolerance  Patient tolerated treatment well       Past Medical History:  Diagnosis Date  . Hyperlipidemia     Past Surgical History:  Procedure Laterality Date  . CHOLECYSTECTOMY  2012  . CRANIECTOMY FOR DEPRESSED SKULL FRACTURE Right 03/28/2018   Procedure: CRANIECTOMY for cerebral edema with Placement of Skull Flap in Abdomen;  Surgeon: Consuella Lose, MD;  Location: [facility];  Service: Neurosurgery;  Laterality: Right;  . CRANIOTOMY Right 03/24/2018   Procedure: CRANIOTOMY, HEMATOMA EVACUATION SUBDURAL, RIGHT SIDE;  Surgeon: Erline Levine, MD;  Location: [facility];  Service: Neurosurgery;  Laterality: Right;  . CRANIOTOMY Right 03/24/2018   Procedure: CRANIOTOMY HEMATOMA EVACUATION EPIDURAL;  Surgeon: Kristeen Miss, MD;  Location: [facility];  Service: Neurosurgery;  Laterality: Right;  . LIPOMA EXCISION     lipoma on forehead   . WISDOM TOOTH EXTRACTION      There were no vitals filed for this visit.  Subjective Assessment - 04/26/18 1501    Subjective  "I don't have to figure out the hours at all at my job."            ADULT SLP TREATMENT - 04/26/18 1501      General Information   Behavior/Cognition  Alert;Pleasant mood;Cooperative      Treatment Provided   Treatment provided  Cognitive-Linquistic      Cognitive-Linquistic Treatment   Treatment focused on  Cognition    Skilled Treatment  "We do whatever we  need to do to get me back on the right track." SLP targeted pt's awareness and attention today with a simple alternating attention task. Initial min-mod cues for directions for task (matching letters to numbers) and pt req'd extra time to match number to corresponding letter and write the letter down in appropriate place. Pt req'd extra time for this task, especially when reaching right edge needing to return to left side on the next line. Pt pace slowed when SLP added distractor (radio). With distractor, pt filled in half of the number of letters he did without a distractor. SLP used this example to educate pt that his ability to concentrate amidst a little extraneous noise significantly impeded ability to work on a simple task (25 responses (silent) compared to 11 responses (radio on). In aother simple divided attention task pt req'd cues for error awareness and total A in problem solving how to ensure correct procedure from point of error onward.       Assessment / Recommendations / Plan   Plan  Continue with current plan of care      Progression Toward Goals   Progression toward goals  Progressing toward goals       SLP Education - 04/26/18 1542    Education Details  deficit areas and how they may affect him in life outside therapy, working  on SKILLS necessary to complete the task and not the task itself    Person(s) Educated  Patient    Methods  Explanation;Demonstration    Comprehension  Verbalized understanding;Need further instruction;Verbal cues required       SLP Short Term Goals - 04/26/18 1546      SLP SHORT TERM GOAL #1   Title  pt will tell two cognitive-linguistic deficits to SLP, over two sessions    Time  4    Period  Weeks    Status  On-going      SLP SHORT TERM GOAL #2   Title  pt will demo sustained attention for 10 minutes of therapy tasks in quiet environment x3 sessions     Time  4    Period  Weeks    Status  On-going      SLP SHORT TERM GOAL #3   Title  Pt  will perform min complex organization/planning/sequencing/finance tasks with 85% accuracy over 3 sessions     Time  4    Period  Weeks    Status  On-going      SLP SHORT TERM GOAL #4   Title  pt will initiate functional use of a memory enhancing system/device     Time  4    Period  Weeks    Status  On-going       SLP Long Term Goals - 04/26/18 1546      SLP LONG TERM GOAL #1   Title  pt will demo knowledge of deficits by self-correcting 90% of errors in ST over 4 sessions    Time  8    Period  Weeks    Status  On-going      SLP LONG TERM GOAL #2   Title  pt will demo 15 minutes selective attention to ST tasks in mod noisy environment    Time  8    Period  Weeks    Status  On-going      SLP LONG TERM GOAL #3   Title  Pt will complete mod complex organization/planning/sequencing/finance tasks with 90% success over three sessions     Time  8    Period  Weeks    Status  On-going      SLP LONG TERM GOAL #4   Title  pt will use memory strategies/system in or between 5 sessions     Time  8    Period  Weeks    Status  On-going       Plan - 04/26/18 1544    Clinical Impression Statement  Patient presents with moderate cognitive communication impairment, with deficits in sustained and higher level attention, executive function, memory, problem solving and awareness. Slow processing seen today in reading simple directions. I cont to recommend skilled ST to maximize cognition for safety, to decrease caregiver burden and increase independence.     Speech Therapy Frequency  2x / week    Treatment/Interventions  Environmental controls;Cognitive reorganization;Compensatory strategies;Compensatory techniques;Cueing hierarchy;Internal/external aids;Functional tasks;SLP instruction and feedback;Patient/family education    Potential to Achieve Goals  Good    Consulted and Agree with Plan of Care  Patient       Patient will benefit from skilled therapeutic intervention in order to  improve the following deficits and impairments:   Cognitive communication deficit    Problem List Patient Active Problem List   Diagnosis Date Noted  . Postoperative pain   . Sleep disturbance   . Hyperglycemia   . Vascular  headache   . Executive function deficit   . Labile blood pressure   . Hypokalemia   . Hypoalbuminemia due to protein-calorie malnutrition ([place] [facility])   . Leukocytosis   . Dental caries   . Midline shift of brain   . Subdural bleeding ([place])   . Hyperlipidemia   . Tobacco abuse   . Sinus tachycardia   . Reactive hypertension   . Hypernatremia   . Acute blood loss anemia   . Encounter for intubation   . Epidural hematoma ([facility]) 03/25/2018  . Acute respiratory failure ([facility])   . Non-traumatic acute subdural hematoma ([facility]) 03/24/2018  . Subdural hematoma ([facility]) 03/24/2018  . Chronic calculus cholecystitis 09/11/2011    [facility] ,MS, CCC-SLP  04/26/2018, 3:47 PM  [facility] [address] [place], [place], [address] Phone: [phone]   Fax:  [phone]   Name: SAL SPRATLEY MRN: [id number] Date of Birth: [birth year]

## 2018-04-26 NOTE — Patient Instructions (Signed)
  Please complete the assigned speech therapy homework prior to your next session and return it to the speech therapist at your next visit.  

## 2018-04-29 ENCOUNTER — Ambulatory Visit: Payer: 59

## 2018-04-29 DIAGNOSIS — R41841 Cognitive communication deficit: Secondary | ICD-10-CM | POA: Diagnosis not present

## 2018-04-29 NOTE — Patient Instructions (Signed)
  Please complete the assigned speech therapy homework prior to your next session and return it to the speech therapist at your next visit.  

## 2018-04-29 NOTE — Therapy (Signed)
[facility] [address] [place], [place], [address] Phone: [phone]   Fax:  [phone]  Speech Language Pathology Treatment  Patient Details  Name: Barry Miranda MRN: [id number] Date of Birth: 1961/03/10 Referring Provider: Dr. Naaman Plummer    Encounter Date: 04/29/2018  End of Session - 04/29/18 1626    Visit Number  4    Number of Visits  17    Date for SLP Re-Evaluation  06/14/18    SLP Start Time  1533    SLP Stop Time   1617    SLP Time Calculation (min)  44 min    Activity Tolerance  Patient tolerated treatment well       Past Medical History:  Diagnosis Date  . Hyperlipidemia     Past Surgical History:  Procedure Laterality Date  . CHOLECYSTECTOMY  2012  . CRANIECTOMY FOR DEPRESSED SKULL FRACTURE Right 03/28/2018   Procedure: CRANIECTOMY for cerebral edema with Placement of Skull Flap in Abdomen;  Surgeon: Consuella Lose, MD;  Location: [facility];  Service: Neurosurgery;  Laterality: Right;  . CRANIOTOMY Right 03/24/2018   Procedure: CRANIOTOMY, HEMATOMA EVACUATION SUBDURAL, RIGHT SIDE;  Surgeon: Erline Levine, MD;  Location: [facility];  Service: Neurosurgery;  Laterality: Right;  . CRANIOTOMY Right 03/24/2018   Procedure: CRANIOTOMY HEMATOMA EVACUATION EPIDURAL;  Surgeon: Kristeen Miss, MD;  Location: [facility];  Service: Neurosurgery;  Laterality: Right;  . LIPOMA EXCISION     lipoma on forehead   . WISDOM TOOTH EXTRACTION      There were no vitals filed for this visit.  Subjective Assessment - 04/29/18 1544    Subjective  "well I did my homework."    Currently in Pain?  No/denies            ADULT SLP TREATMENT - 04/29/18 1545      General Information   Behavior/Cognition  Alert;Pleasant mood;Cooperative;Confused      Treatment Provided   Treatment provided  Cognitive-Linquistic      Cognitive-Linquistic Treatment   Treatment focused on  Cognition    Skilled Treatment  Pt asked SLP if he had  more appointments next week and looked in his folder for his schedule. Pt req'd mod A from SLP for organization of his schedule to see if he did indeed have appointmetns next week. Pt did not do second half of homework (folloiwng directions) so SLP assisted pt with this. This detailed task req'd usual mod A initially, faded to usual min A. Pt became more independent at organizing his search pattern during the task and said, "I look more organized like you say, and I do better." In 6-step sequence, pt req'd written cues for expedient completion. Without written cues pt req'd extra time and min SLP verbal cues. Some splinter skills in emergent awareness seen in simpler tasks today.       Assessment / Recommendations / Plan   Plan  Continue with current plan of care      Progression Toward Goals   Progression toward goals  Progressing toward goals         SLP Short Term Goals - 04/29/18 1627      SLP SHORT TERM GOAL #1   Title  pt will tell two cognitive-linguistic deficits to SLP, over two sessions    Time  3    Period  Weeks    Status  On-going      SLP SHORT TERM GOAL #2   Title  pt will demo  sustained attention for 10 minutes of therapy tasks in quiet environment x3 sessions     Baseline  04-29-18    Time  3    Period  Weeks    Status  On-going      SLP SHORT TERM GOAL #3   Title  Pt will perform min complex organization/planning/sequencing/finance tasks with 85% accuracy over 3 sessions     Time  3    Period  Weeks    Status  On-going      SLP SHORT TERM GOAL #4   Title  pt will initiate functional use of a memory enhancing system/device     Time  3    Period  Weeks    Status  On-going       SLP Long Term Goals - 04/29/18 1628      SLP LONG TERM GOAL #1   Title  pt will demo knowledge of deficits by self-correcting 90% of errors in ST over 4 sessions    Time  7    Period  Weeks    Status  On-going      SLP LONG TERM GOAL #2   Title  pt will demo 15 minutes selective  attention to ST tasks in mod noisy environment    Time  7    Period  Weeks    Status  On-going      SLP LONG TERM GOAL #3   Title  Pt will complete mod complex organization/planning/sequencing/finance tasks with 90% success over three sessions     Time  7    Period  Weeks    Status  On-going      SLP LONG TERM GOAL #4   Title  pt will use memory strategies/system in or between 5 sessions     Time  7    Period  Weeks    Status  On-going       Plan - 04/29/18 1626    Clinical Impression Statement  Patient presents with cont'd moderate cognitive communication impairment, with deficits in sustained and higher level attention, executive function, memory, problem solving and awareness. Some splinter skills observed today with emergent awareness in simpler tasks. Slow processing seen today in reading simple directions. I cont to recommend skilled ST to maximize cognition for safety, to decrease caregiver burden and increase independence.     Speech Therapy Frequency  2x / week    Treatment/Interventions  Environmental controls;Cognitive reorganization;Compensatory strategies;Compensatory techniques;Cueing hierarchy;Internal/external aids;Functional tasks;SLP instruction and feedback;Patient/family education    Potential to Achieve Goals  Good    Consulted and Agree with Plan of Care  Patient       Patient will benefit from skilled therapeutic intervention in order to improve the following deficits and impairments:   Cognitive communication deficit    Problem List Patient Active Problem List   Diagnosis Date Noted  . Postoperative pain   . Sleep disturbance   . Hyperglycemia   . Vascular headache   . Executive function deficit   . Labile blood pressure   . Hypokalemia   . Hypoalbuminemia due to protein-calorie malnutrition ([place])   . Leukocytosis   . Dental caries   . Midline shift of brain   . Subdural bleeding ([facility])   . Hyperlipidemia   . Tobacco abuse   . Sinus tachycardia    . Reactive hypertension   . Hypernatremia   . Acute blood loss anemia   . Encounter for intubation   . Epidural hematoma ([place]) 03/25/2018  .  Acute respiratory failure ([facility])   . Non-traumatic acute subdural hematoma ([facility]) 03/24/2018  . Subdural hematoma ([place]) 03/24/2018  . Chronic calculus cholecystitis 09/11/2011    [facility] ,[place], CCC-SLP  04/29/2018, 4:28 PM  [facility] [address] [place], [place], [address] Phone: [phone]   Fax:  [phone]   Name: Barry Miranda MRN: [id number] Date of Birth: 1961/04/25

## 2018-05-07 ENCOUNTER — Encounter: Payer: 59 | Admitting: Speech Pathology

## 2018-05-07 ENCOUNTER — Ambulatory Visit: Payer: 59 | Admitting: Speech Pathology

## 2018-05-07 DIAGNOSIS — R41841 Cognitive communication deficit: Secondary | ICD-10-CM | POA: Diagnosis not present

## 2018-05-07 NOTE — Therapy (Signed)
[facility] [address] [place], [place], [address] Phone: [phone]   Fax:  [phone]  Speech Language Pathology Treatment  Patient Details  Name: Barry Miranda MRN: [id number] Date of Birth: April 11, 1961 Referring Provider: Dr. Naaman Plummer    Encounter Date: 05/07/2018  End of Session - 05/07/18 0835    Visit Number  5    Number of Visits  17    Date for SLP Re-Evaluation  06/14/18    SLP Start Time  0813 pt arrived 8:12    SLP Stop Time   0845    SLP Time Calculation (min)  32 min    Activity Tolerance  Patient tolerated treatment well       Past Medical History:  Diagnosis Date  . Hyperlipidemia     Past Surgical History:  Procedure Laterality Date  . CHOLECYSTECTOMY  2012  . CRANIECTOMY FOR DEPRESSED SKULL FRACTURE Right 03/28/2018   Procedure: CRANIECTOMY for cerebral edema with Placement of Skull Flap in Abdomen;  Surgeon: Consuella Lose, MD;  Location: [facility];  Service: Neurosurgery;  Laterality: Right;  . CRANIOTOMY Right 03/24/2018   Procedure: CRANIOTOMY, HEMATOMA EVACUATION SUBDURAL, RIGHT SIDE;  Surgeon: Erline Levine, MD;  Location: [facility];  Service: Neurosurgery;  Laterality: Right;  . CRANIOTOMY Right 03/24/2018   Procedure: CRANIOTOMY HEMATOMA EVACUATION EPIDURAL;  Surgeon: Kristeen Miss, MD;  Location: [facility];  Service: Neurosurgery;  Laterality: Right;  . LIPOMA EXCISION     lipoma on forehead   . WISDOM TOOTH EXTRACTION      There were no vitals filed for this visit.  Subjective Assessment - 05/07/18 0813    Subjective  "Sorry we late." 8:12 arrival    Currently in Pain?  No/denies            ADULT SLP TREATMENT - 05/07/18 0814      General Information   Behavior/Cognition  Alert;Pleasant mood;Cooperative;Confused      Treatment Provided   Treatment provided  Cognitive-Linquistic      Pain Assessment   Pain Assessment  No/denies pain      Cognitive-Linquistic Treatment   Treatment focused on  Cognition    Skilled Treatment  Pt arrived 12 minutes late for appointment. Verbal cues to retrieve homework, which was completed. Time problem solving was 80% accurate, min cues for double checking and extended time required to 2/2 ID errors. SLP targeted sustained attention using min complex time problem solving task; pt maintained sustained attention for 11 minutes. Pt retrieved his phone from his pocket twice during the session (between tasks), returned with subtle non-verbal cue.      Assessment / Recommendations / Plan   Plan  Continue with current plan of care      Progression Toward Goals   Progression toward goals  Progressing toward goals       SLP Education - 05/07/18 0836    Education Details  double check work for errors    [employer]) Educated  Patient    Methods  Explanation    Comprehension  Verbalized understanding;Need further instruction       SLP Short Term Goals - 05/07/18 0820      SLP SHORT TERM GOAL #1   Title  pt will tell two cognitive-linguistic deficits to SLP, over two sessions    Time  2    Period  Weeks    Status  On-going      SLP SHORT TERM GOAL #2   Title  pt will  demo sustained attention for 10 minutes of therapy tasks in quiet environment x3 sessions     Baseline  04-29-18, 05/07/18    Time  2    Period  Weeks    Status  On-going      SLP SHORT TERM GOAL #3   Title  Pt will perform min complex organization/planning/sequencing/finance tasks with 85% accuracy over 3 sessions     Time  2    Period  Weeks    Status  On-going      SLP SHORT TERM GOAL #4   Title  pt will initiate functional use of a memory enhancing system/device     Time  2    Period  Weeks    Status  On-going       SLP Long Term Goals - 05/07/18 3244      SLP LONG TERM GOAL #1   Title  pt will demo knowledge of deficits by self-correcting 90% of errors in ST over 4 sessions    Time  6    Period  Weeks    Status  On-going      SLP LONG TERM GOAL  #2   Title  pt will demo 15 minutes selective attention to ST tasks in mod noisy environment    Time  6    Period  Weeks    Status  On-going      SLP LONG TERM GOAL #3   Title  Pt will complete mod complex organization/planning/sequencing/finance tasks with 90% success over three sessions     Time  6    Period  Weeks    Status  On-going      SLP LONG TERM GOAL #4   Title  pt will use memory strategies/system in or between 5 sessions     Time  6    Period  Weeks    Status  On-going       Plan - 05/07/18 0835    Clinical Impression Statement  Patient presents with cont'd moderate cognitive communication impairment, with deficits in sustained and higher level attention, executive function, memory, problem solving and awareness. Some splinter skills observed today with emergent awareness in simpler tasks. Slow processing seen today in reading simple directions. I cont to recommend skilled ST to maximize cognition for safety, to decrease caregiver burden and increase independence.     Speech Therapy Frequency  2x / week    Treatment/Interventions  Environmental controls;Cognitive reorganization;Compensatory strategies;Compensatory techniques;Cueing hierarchy;Internal/external aids;Functional tasks;SLP instruction and feedback;Patient/family education    Potential to Achieve Goals  Good    Consulted and Agree with Plan of Care  Patient       Patient will benefit from skilled therapeutic intervention in order to improve the following deficits and impairments:   Cognitive communication deficit    Problem List Patient Active Problem List   Diagnosis Date Noted  . Postoperative pain   . Sleep disturbance   . Hyperglycemia   . Vascular headache   . Executive function deficit   . Labile blood pressure   . Hypokalemia   . Hypoalbuminemia due to protein-calorie malnutrition ([facility])   . Leukocytosis   . Dental caries   . Midline shift of brain   . Subdural bleeding ([facility])   .  Hyperlipidemia   . Tobacco abuse   . Sinus tachycardia   . Reactive hypertension   . Hypernatremia   . Acute blood loss anemia   . Encounter for intubation   . Epidural hematoma (  [place]) 03/25/2018  . Acute respiratory failure ([facility])   . Non-traumatic acute subdural hematoma ([facility]) 03/24/2018  . Subdural hematoma ([facility]) 03/24/2018  . Chronic calculus cholecystitis 09/11/2011   Deneise Lever, [place], [place] 05/07/2018, 8:43 AM  [facility] [address] [place], [place], [address] Phone: [phone]   Fax:  [phone]   Name: YOSMAR RYKER MRN: [id number] Date of Birth: [birth year]

## 2018-05-08 ENCOUNTER — Ambulatory Visit (HOSPITAL_COMMUNITY): Payer: 59

## 2018-05-09 ENCOUNTER — Ambulatory Visit: Payer: 59 | Attending: Physical Medicine & Rehabilitation | Admitting: Speech Pathology

## 2018-05-09 ENCOUNTER — Ambulatory Visit: Payer: 59 | Admitting: Physical Therapy

## 2018-05-09 DIAGNOSIS — R41841 Cognitive communication deficit: Secondary | ICD-10-CM | POA: Insufficient documentation

## 2018-05-09 NOTE — Therapy (Signed)
[facility] [address] [place], [place], [address] Phone: [phone]   Fax:  [phone]  Speech Language Pathology Treatment  Patient Details  Name: Barry Miranda MRN: [id number] Date of Birth: 1961-06-27 Referring Provider: Dr. Naaman Plummer    Encounter Date: 05/09/2018  End of Session - 05/09/18 0934    Visit Number  6    Number of Visits  17    Date for SLP Re-Evaluation  06/14/18    SLP Start Time  0848    SLP Stop Time   0930    SLP Time Calculation (min)  42 min    Activity Tolerance  Patient tolerated treatment well       Past Medical History:  Diagnosis Date  . Hyperlipidemia     Past Surgical History:  Procedure Laterality Date  . CHOLECYSTECTOMY  2012  . CRANIECTOMY FOR DEPRESSED SKULL FRACTURE Right 03/28/2018   Procedure: CRANIECTOMY for cerebral edema with Placement of Skull Flap in Abdomen;  Surgeon: Consuella Lose, MD;  Location: [facility];  Service: Neurosurgery;  Laterality: Right;  . CRANIOTOMY Right 03/24/2018   Procedure: CRANIOTOMY, HEMATOMA EVACUATION SUBDURAL, RIGHT SIDE;  Surgeon: Erline Levine, MD;  Location: [facility];  Service: Neurosurgery;  Laterality: Right;  . CRANIOTOMY Right 03/24/2018   Procedure: CRANIOTOMY HEMATOMA EVACUATION EPIDURAL;  Surgeon: Kristeen Miss, MD;  Location: [facility];  Service: Neurosurgery;  Laterality: Right;  . LIPOMA EXCISION     lipoma on forehead   . WISDOM TOOTH EXTRACTION      There were no vitals filed for this visit.  Subjective Assessment - 05/09/18 0850    Subjective  "I feel comfortable with them this time."    Currently in Pain?  No/denies            ADULT SLP TREATMENT - 05/09/18 0848      General Information   Behavior/Cognition  Alert;Pleasant mood;Cooperative;Confused    Patient Positioning  Upright in chair      Treatment Provided   Treatment provided  Cognitive-Linquistic      Pain Assessment   Pain Assessment  No/denies pain       Cognitive-Linquistic Treatment   Treatment focused on  Cognition    Skilled Treatment  Homework (time comparisons) ~90% accurate. With min cues pt ID'd 1/3 errors; SLP told pt to double check at home to find remaining 2 errors. SLP targeted sustained attention in simple-mod task (organizing information/following directions). 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," SLP educated that cognitive skills vs individual tasks are targets of therapy, and that these skills are also necessary to complete [employer] work."      Assessment / Recommendations / Plan   Plan  Continue with current plan of care      Progression Toward Goals   Progression toward goals  Progressing toward goals       SLP Education - 05/09/18 0933    Education Details  deficit areas, SLP is targeting cognitive skills vs individual tasks    Person(s) Educated  Patient    Methods  Explanation    Comprehension  Verbalized understanding;Need further instruction       SLP Short Term Goals - 05/09/18 0901      SLP SHORT TERM GOAL #1   Title  pt will tell two cognitive-linguistic deficits to SLP, over two sessions    Time  2    Period  Weeks    Status  On-going  SLP SHORT TERM GOAL #2   Title  pt will demo sustained attention for 10 minutes of therapy tasks in quiet environment x3 sessions     Baseline  04-29-18, 05/07/18, 05/09/18    Time  2    Period  Weeks    Status  Achieved      SLP SHORT TERM GOAL #3   Title  Pt will perform min complex organization/planning/sequencing/finance tasks with 85% accuracy over 3 sessions     Baseline  05/09/18    Time  2    Period  Weeks    Status  On-going      SLP SHORT TERM GOAL #4   Title  pt will initiate functional use of a memory enhancing system/device     Time  2    Period  Weeks    Status  On-going       SLP Long Term Goals - 05/09/18 0935      SLP LONG TERM GOAL #1   Title  pt will demo knowledge of deficits by self-correcting 90% of errors in  ST over 4 sessions    Time  6    Period  Weeks    Status  On-going      SLP LONG TERM GOAL #2   Title  pt will demo 15 minutes selective attention to ST tasks in mod noisy environment    Time  6    Period  Weeks    Status  On-going      SLP LONG TERM GOAL #3   Title  Pt will complete mod complex organization/planning/sequencing/finance tasks with 90% success over three sessions     Time  6    Period  Weeks    Status  On-going      SLP LONG TERM GOAL #4   Title  pt will use memory strategies/system in or between 5 sessions     Time  6    Period  Weeks    Status  On-going       Plan - 05/09/18 0934    Clinical Impression Statement  Patient presents with cont'd moderate cognitive communication impairment, with deficits in sustained and higher level attention, executive function, memory, problem solving and awareness. Some splinter skills observed today with emergent awareness in simpler tasks. Slow processing seen today in reading simple directions. I cont to recommend skilled ST to maximize cognition for safety, to decrease caregiver burden and increase independence.     Speech Therapy Frequency  2x / week    Treatment/Interventions  Environmental controls;Cognitive reorganization;Compensatory strategies;Compensatory techniques;Cueing hierarchy;Internal/external aids;Functional tasks;SLP instruction and feedback;Patient/family education    Potential to Achieve Goals  Good    Consulted and Agree with Plan of Care  Patient       Patient will benefit from skilled therapeutic intervention in order to improve the following deficits and impairments:   Cognitive communication deficit    Problem List Patient Active Problem List   Diagnosis Date Noted  . Postoperative pain   . Sleep disturbance   . Hyperglycemia   . Vascular headache   . Executive function deficit   . Labile blood pressure   . Hypokalemia   . Hypoalbuminemia due to protein-calorie malnutrition ([place])   .  Leukocytosis   . Dental caries   . Midline shift of brain   . Subdural bleeding ([place])   . Hyperlipidemia   . Tobacco abuse   . Sinus tachycardia   . Reactive hypertension   . Hypernatremia   .  Acute blood loss anemia   . Encounter for intubation   . Epidural hematoma ([place]) 03/25/2018  . Acute respiratory failure ([facility])   . Non-traumatic acute subdural hematoma ([place]) 03/24/2018  . Subdural hematoma ([place]) 03/24/2018  . Chronic calculus cholecystitis 09/11/2011   Deneise Lever, [place], [place] Speech-Language Pathologist  Aliene Altes 05/09/2018, 9:36 AM  [facility] [address] [place], [place], [address] Phone: [phone]   Fax:  [phone]   Name: Barry Miranda MRN: [id number] Date of Birth: 1961-06-30

## 2018-05-14 ENCOUNTER — Ambulatory Visit (HOSPITAL_COMMUNITY)
Admission: RE | Admit: 2018-05-14 | Discharge: 2018-05-14 | Disposition: A | Discharge status: Home / Self Care | Payer: 59 | Source: Ambulatory Visit | Attending: Neurosurgery | Admitting: Neurosurgery

## 2018-05-14 DIAGNOSIS — I6200 Nontraumatic subdural hemorrhage, unspecified: Secondary | ICD-10-CM | POA: Diagnosis not present

## 2018-05-14 DIAGNOSIS — S065X9A Traumatic subdural hemorrhage with loss of consciousness of unspecified duration, initial encounter: Secondary | ICD-10-CM | POA: Insufficient documentation

## 2018-05-14 DIAGNOSIS — S065XAA Traumatic subdural hemorrhage with loss of consciousness status unknown, initial encounter: Secondary | ICD-10-CM

## 2018-05-14 DIAGNOSIS — X58XXXA Exposure to other specified factors, initial encounter: Secondary | ICD-10-CM | POA: Diagnosis not present

## 2018-05-16 ENCOUNTER — Other Ambulatory Visit: Payer: Self-pay | Admitting: Neurosurgery

## 2018-05-16 ENCOUNTER — Ambulatory Visit: Payer: 59 | Admitting: Speech Pathology

## 2018-05-16 ENCOUNTER — Encounter: Payer: 59 | Admitting: Occupational Therapy

## 2018-05-16 DIAGNOSIS — R41841 Cognitive communication deficit: Secondary | ICD-10-CM | POA: Diagnosis not present

## 2018-05-16 NOTE — Therapy (Signed)
[facility] [address] [place], [place], [address] Phone: [phone]   Fax:  [phone]  Speech Language Pathology Treatment  Patient Details  Name: Barry Miranda MRN: [id number] Date of Birth: 06/19/1961 Referring Provider: Dr. Naaman Plummer    Encounter Date: 05/16/2018  End of Session - 05/16/18 1733    Visit Number  7    Number of Visits  17    Date for SLP Re-Evaluation  06/14/18    SLP Start Time  8882    SLP Stop Time   1614    SLP Time Calculation (min)  44 min    Activity Tolerance  Patient tolerated treatment well       Past Medical History:  Diagnosis Date  . Hyperlipidemia     Past Surgical History:  Procedure Laterality Date  . CHOLECYSTECTOMY  2012  . CRANIECTOMY FOR DEPRESSED SKULL FRACTURE Right 03/28/2018   Procedure: CRANIECTOMY for cerebral edema with Placement of Skull Flap in Abdomen;  Surgeon: Consuella Lose, MD;  Location: [facility];  Service: Neurosurgery;  Laterality: Right;  . CRANIOTOMY Right 03/24/2018   Procedure: CRANIOTOMY, HEMATOMA EVACUATION SUBDURAL, RIGHT SIDE;  Surgeon: Erline Levine, MD;  Location: [facility];  Service: Neurosurgery;  Laterality: Right;  . CRANIOTOMY Right 03/24/2018   Procedure: CRANIOTOMY HEMATOMA EVACUATION EPIDURAL;  Surgeon: Kristeen Miss, MD;  Location: [facility];  Service: Neurosurgery;  Laterality: Right;  . LIPOMA EXCISION     lipoma on forehead   . WISDOM TOOTH EXTRACTION      There were no vitals filed for this visit.  Subjective Assessment - 05/16/18 1534    Subjective  "I stay out of work too long."    Currently in Pain?  No/denies            ADULT SLP TREATMENT - 05/16/18 1530      General Information   Behavior/Cognition  Alert;Pleasant mood;Cooperative;Confused      Treatment Provided   Treatment provided  Cognitive-Linquistic      Pain Assessment   Pain Assessment  No/denies pain      Cognitive-Linquistic Treatment   Treatment focused  on  Cognition    Skilled Treatment  Pt arrived with homework completed; he reports his daughter checked and pointed out an error, which he corrected. Pt errors in word problems attributable to decreased attention to/processing of written instructions, occasional min cues to correct errors. Pt independently used his calendar today to locate time/date of next appointment when questioned. He told SLP he is ready to return to work and is disappointed that he will have to wait for additional time post-surgery, which is scheduled in Sept. SLP educated that even if surgery were to occur sooner, pt would have difficulty returning to work due to cognitive deficits. SLP used planned failure to demonstrate pt's attention deficits in task targeting alternating attention. Pt recognized he made errors in the task but required mod A to ID location of errors. SLP educated pt that his decreased attention likely to impact him at work.       Assessment / Recommendations / Plan   Plan  Continue with current plan of care      Progression Toward Goals   Progression toward goals  Progressing toward goals         SLP Short Term Goals - 05/16/18 1555      SLP SHORT TERM GOAL #1   Title  pt will tell two cognitive-linguistic deficits to SLP, over two sessions  Time  1    Period  Weeks    Status  On-going      SLP SHORT TERM GOAL #2   Title  pt will demo sustained attention for 10 minutes of therapy tasks in quiet environment x3 sessions     Baseline  04-29-18, 05/07/18, 05/09/18    Status  Achieved      SLP SHORT TERM GOAL #3   Title  Pt will perform min complex organization/planning/sequencing/finance tasks with 85% accuracy over 3 sessions     Baseline  05/09/18, 05/16/18    Time  1    Period  Weeks    Status  On-going      SLP SHORT TERM GOAL #4   Title  pt will initiate functional use of a memory enhancing system/device     Baseline  05/16/18    Time  1    Period  Weeks    Status  Achieved       SLP Long  Term Goals - 05/16/18 1556      SLP LONG TERM GOAL #1   Title  pt will demo knowledge of deficits by self-correcting 90% of errors in ST over 4 sessions    Time  5    Period  Weeks    Status  On-going      SLP LONG TERM GOAL #2   Title  pt will demo 15 minutes selective attention to ST tasks in mod noisy environment    Time  5    Period  Weeks    Status  On-going      SLP LONG TERM GOAL #3   Title  Pt will complete mod complex organization/planning/sequencing/finance tasks with 90% success over three sessions     Time  5    Period  Weeks    Status  On-going      SLP LONG TERM GOAL #4   Title  pt will use memory strategies/system in or between 5 sessions     Time  5    Period  Weeks    Status  On-going       Plan - 05/16/18 1733    Clinical Impression Statement  Patient presents with cont'd moderate cognitive communication impairments in higher level attention, executive function, memory, problem solving and awareness. Sustained/selective attention improving, as is emergent awareness in simpler tasks. Slow processing seen today in reading simple directions. I cont to recommend skilled ST to maximize cognition for safety, to decrease caregiver burden and increase independence.     Speech Therapy Frequency  2x / week    Treatment/Interventions  Environmental controls;Cognitive reorganization;Compensatory strategies;Compensatory techniques;Cueing hierarchy;Internal/external aids;Functional tasks;SLP instruction and feedback;Patient/family education    Potential to Achieve Goals  Good    Consulted and Agree with Plan of Care  Patient       Patient will benefit from skilled therapeutic intervention in order to improve the following deficits and impairments:   Cognitive communication deficit    Problem List Patient Active Problem List   Diagnosis Date Noted  . Postoperative pain   . Sleep disturbance   . Hyperglycemia   . Vascular headache   . Executive function deficit   .  Labile blood pressure   . Hypokalemia   . Hypoalbuminemia due to protein-calorie malnutrition ([place])   . Leukocytosis   . Dental caries   . Midline shift of brain   . Subdural bleeding ([place])   . Hyperlipidemia   . Tobacco abuse   . Sinus  tachycardia   . Reactive hypertension   . Hypernatremia   . Acute blood loss anemia   . Encounter for intubation   . Epidural hematoma ([place]) 03/25/2018  . Acute respiratory failure ([place])   . Non-traumatic acute subdural hematoma ([place]) 03/24/2018  . Subdural hematoma ([place]) 03/24/2018  . Chronic calculus cholecystitis 09/11/2011   Deneise Lever, [place], [place] 05/16/2018, 5:40 PM  [facility] [address] [place], [place], [address] Phone: [phone]   Fax:  [phone]   Name: Barry Miranda MRN: [id number] Date of Birth: [birth year]

## 2018-05-22 DIAGNOSIS — I6200 Nontraumatic subdural hemorrhage, unspecified: Secondary | ICD-10-CM | POA: Diagnosis not present

## 2018-05-22 DIAGNOSIS — Z1159 Encounter for screening for other viral diseases: Secondary | ICD-10-CM | POA: Diagnosis not present

## 2018-05-22 DIAGNOSIS — E785 Hyperlipidemia, unspecified: Secondary | ICD-10-CM | POA: Diagnosis not present

## 2018-05-22 DIAGNOSIS — Z9889 Other specified postprocedural states: Secondary | ICD-10-CM | POA: Diagnosis not present

## 2018-05-22 DIAGNOSIS — E559 Vitamin D deficiency, unspecified: Secondary | ICD-10-CM | POA: Diagnosis not present

## 2018-05-22 DIAGNOSIS — Z298 Encounter for other specified prophylactic measures: Secondary | ICD-10-CM | POA: Diagnosis not present

## 2018-05-23 ENCOUNTER — Ambulatory Visit: Payer: 59 | Admitting: Physical Therapy

## 2018-05-23 ENCOUNTER — Ambulatory Visit: Payer: 59 | Admitting: Speech Pathology

## 2018-05-23 ENCOUNTER — Encounter: Payer: 59 | Admitting: Occupational Therapy

## 2018-05-23 DIAGNOSIS — R41841 Cognitive communication deficit: Secondary | ICD-10-CM

## 2018-05-23 NOTE — Therapy (Signed)
[facility] [address] [place], [place], [address] Phone: [phone]   Fax:  [phone]  Speech Language Pathology Treatment  Patient Details  Name: Barry Miranda MRN: [id number] Date of Birth: 07/12/1961 Referring Provider: Dr. Naaman Plummer    Encounter Date: 05/23/2018  End of Session - 05/23/18 0945    Visit Number  8    Number of Visits  17    Date for SLP Re-Evaluation  06/14/18    SLP Start Time  0939   pt arrived 9 min late   SLP Stop Time   1020    SLP Time Calculation (min)  41 min    Activity Tolerance  Patient tolerated treatment well       Past Medical History:  Diagnosis Date  . Hyperlipidemia     Past Surgical History:  Procedure Laterality Date  . CHOLECYSTECTOMY  2012  . CRANIECTOMY FOR DEPRESSED SKULL FRACTURE Right 03/28/2018   Procedure: CRANIECTOMY for cerebral edema with Placement of Skull Flap in Abdomen;  Surgeon: Consuella Lose, MD;  Location: [facility];  Service: Neurosurgery;  Laterality: Right;  . CRANIOTOMY Right 03/24/2018   Procedure: CRANIOTOMY, HEMATOMA EVACUATION SUBDURAL, RIGHT SIDE;  Surgeon: Erline Levine, MD;  Location: [facility];  Service: Neurosurgery;  Laterality: Right;  . CRANIOTOMY Right 03/24/2018   Procedure: CRANIOTOMY HEMATOMA EVACUATION EPIDURAL;  Surgeon: Kristeen Miss, MD;  Location: [facility];  Service: Neurosurgery;  Laterality: Right;  . LIPOMA EXCISION     lipoma on forehead   . WISDOM TOOTH EXTRACTION      There were no vitals filed for this visit.  Subjective Assessment - 05/23/18 0939    Subjective  "After this I gotta help her moving."     Currently in Pain?  No/denies            ADULT SLP TREATMENT - 05/23/18 0940      General Information   Behavior/Cognition  Alert;Pleasant mood;Cooperative;Confused    Patient Positioning  Upright in chair      Treatment Provided   Treatment provided  Cognitive-Linquistic      Pain Assessment   Pain Assessment   No/denies pain      Cognitive-Linquistic Treatment   Treatment focused on  Cognition    Skilled Treatment  Pt arrived 9 min late. SLP targeted selective attention in mod noisy environment (door open to noisy hallway) with mod complex reasoning task. Pt with slow processing, reading and re-reading task instructions several times before beginning. Pt required extended time and min-mod cues for using an appropriate strategy to approach the task. Occasional min question cues for reasoning. Pt referenced his therapy calendar independently when discussing upcoming ST schedule.      Assessment / Recommendations / Plan   Plan  Goals updated      Progression Toward Goals   Progression toward goals  Progressing toward goals         SLP Short Term Goals - 05/23/18 0944      SLP SHORT TERM GOAL #1   Title  pt will tell two cognitive-linguistic deficits to SLP, over two sessions    Status  Not Met      SLP SHORT TERM GOAL #2   Title  pt will demo sustained attention for 10 minutes of therapy tasks in quiet environment x3 sessions     Status  Achieved      SLP SHORT TERM GOAL #3   Title  Pt will perform min complex organization/planning/sequencing/finance tasks  with 85% accuracy over 3 sessions     Status  Partially Met      SLP SHORT TERM GOAL #4   Title  pt will initiate functional use of a memory enhancing system/device     Status  Achieved       SLP Long Term Goals - 05/23/18 0944      SLP LONG TERM GOAL #1   Title  pt will demo knowledge of deficits by self-correcting 90% of errors in ST over 4 sessions    Time  4    Status  On-going      SLP LONG TERM GOAL #2   Title  pt will demo 15 minutes alternating attention between mod complex tasks with >95% accuracy over 3 sessions    Time  4    Period  Weeks    Status  Revised      SLP LONG TERM GOAL #3   Title  Pt will complete mod complex organization/planning/sequencing/finance tasks with 90% success over three sessions      Time  4    Period  Weeks    Status  On-going      SLP LONG TERM GOAL #4   Title  pt will use memory strategies/system in or between 5 sessions     Baseline  05/16/18, 05/23/18    Time  4    Period  Weeks    Status  On-going       Plan - 05/23/18 1025    Clinical Impression Statement  Patient presents with cont'd moderate cognitive communication impairments in higher level attention, executive function, memory, problem solving and awareness. Sustained/selective attention improving, as is emergent awareness in simpler tasks; LTG goal upgraded from selective to alternating attention. SLP suspects at least some of slower processing in linguistic tasks is attributable to the fact that [place] is pt's second language. I cont to recommend skilled ST to maximize cognition for safety, to decrease caregiver burden and increase independence.     Speech Therapy Frequency  2x / week    Treatment/Interventions  Environmental controls;Cognitive reorganization;Compensatory strategies;Compensatory techniques;Cueing hierarchy;Internal/external aids;Functional tasks;SLP instruction and feedback;Patient/family education    Potential to Achieve Goals  Good    Consulted and Agree with Plan of Care  Patient       Patient will benefit from skilled therapeutic intervention in order to improve the following deficits and impairments:   Cognitive communication deficit    Problem List Patient Active Problem List   Diagnosis Date Noted  . Postoperative pain   . Sleep disturbance   . Hyperglycemia   . Vascular headache   . Executive function deficit   . Labile blood pressure   . Hypokalemia   . Hypoalbuminemia due to protein-calorie malnutrition ([place])   . Leukocytosis   . Dental caries   . Midline shift of brain   . Subdural bleeding ([place])   . Hyperlipidemia   . Tobacco abuse   . Sinus tachycardia   . Reactive hypertension   . Hypernatremia   . Acute blood loss anemia   . Encounter for intubation   .  Epidural hematoma ([place]) 03/25/2018  . Acute respiratory failure ([place])   . Non-traumatic acute subdural hematoma ([place]) 03/24/2018  . Subdural hematoma ([place]) 03/24/2018  . Chronic calculus cholecystitis 09/11/2011   Deneise Lever, [place], [place] 05/23/2018, 10:30 AM  [facility] [address] [place], [place], [address] Phone: [phone]  Fax:  [phone]   Name: Barry Miranda MRN: [id number] Date of Birth: May 21, 1961

## 2018-05-24 ENCOUNTER — Other Ambulatory Visit: Payer: Self-pay | Admitting: Registered Nurse

## 2018-05-27 ENCOUNTER — Other Ambulatory Visit: Payer: Self-pay

## 2018-05-27 ENCOUNTER — Encounter: Payer: Self-pay | Admitting: Physical Medicine & Rehabilitation

## 2018-05-27 ENCOUNTER — Encounter: Payer: 59 | Attending: Registered Nurse | Admitting: Physical Medicine & Rehabilitation

## 2018-05-27 VITALS — BP 112/72 | HR 79 | Ht 64.0 in | Wt 150.4 lb

## 2018-05-27 DIAGNOSIS — G479 Sleep disorder, unspecified: Secondary | ICD-10-CM | POA: Diagnosis not present

## 2018-05-27 DIAGNOSIS — G441 Vascular headache, not elsewhere classified: Secondary | ICD-10-CM | POA: Insufficient documentation

## 2018-05-27 DIAGNOSIS — S065X9A Traumatic subdural hemorrhage with loss of consciousness of unspecified duration, initial encounter: Secondary | ICD-10-CM | POA: Insufficient documentation

## 2018-05-27 DIAGNOSIS — F17210 Nicotine dependence, cigarettes, uncomplicated: Secondary | ICD-10-CM | POA: Insufficient documentation

## 2018-05-27 DIAGNOSIS — I6200 Nontraumatic subdural hemorrhage, unspecified: Secondary | ICD-10-CM

## 2018-05-27 DIAGNOSIS — E785 Hyperlipidemia, unspecified: Secondary | ICD-10-CM | POA: Diagnosis not present

## 2018-05-27 MED ORDER — LEVETIRACETAM 1000 MG PO TABS: 1000.0000 mg | ORAL_TABLET | Freq: Two times a day (BID) | ORAL | 2 refills | Status: AC

## 2018-05-27 MED ORDER — TOPIRAMATE 50 MG PO TABS: 50.0000 mg | ORAL_TABLET | Freq: Every day | ORAL | 1 refills | Status: AC

## 2018-05-27 NOTE — Patient Instructions (Signed)
I can help with work let me know.   You can probably return to work after your bone flap.   I would like to see you after the flap to discuss return to work, medications, and other things.

## 2018-05-27 NOTE — Progress Notes (Signed)
Subjective:    Patient ID: Barry Miranda, male    DOB: Mar 03, 1961, 57 y.o.   MRN: [id number]  HPI   Gatt is ere in follow up of his SDH. He has been home. Since the beginning of July. He continues to wear his helmet. He has his cranioplasty scheduled for next month. He has minimal pain.   He states that cognitively he has improved quite a bit. He is cooking and functioning normal at home. His balance is stable.   He is sleeping about 7-8 hours per night. He naps a little bit during the day, perhaps a half an hour. Emotionally he feels pretty much like his old self.    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     Pain Inventory Average Pain 0 Pain Right Now 0 My pain is no pain  In the last 24 hours, has pain interfered with the following? General activity 0 Relation with others 0 Enjoyment of life 0 What TIME of day is your pain at its worst? no pain Sleep (in general) Fair  Pain is worse with: no pain Pain improves with: no pain Relief from Meds: no pain  Mobility walk without assistance ability to climb steps?  yes do you drive?  yes  Function not employed: date last employed n/a  Neuro/Psych No problems in this area  Prior Studies Any changes since last visit?  yes CT/MRI  Physicians involved in your care Any changes since last visit?  yes Neurosurgeon Dr. Vertell Limber   Family History  Problem Relation Age of Onset  . Hypertension Mother   . Diabetes Mother    Social History   Socioeconomic History  . Marital status: Married    Spouse name: Not on file  . Number of children: Not on file  . Years of education: Not on file  . Highest education level: Not on file  Occupational History  . Not on file  Social Needs  . Financial resource strain: Not on file  . Food insecurity:    Worry: Not on file    Inability: Not on file  . Transportation needs:    Medical: Not on file    Non-medical: Not on file  Tobacco Use  .  Smoking status: Current Every Day Smoker    Packs/day: 0.15  . Smokeless tobacco: Never Used  Substance and Sexual Activity  . Alcohol use: Yes    Alcohol/week: 7.0 standard drinks    Types: 7 Cans of beer per week  . Drug use: No  . Sexual activity: Yes  Lifestyle  . Physical activity:    Days per week: Not on file    Minutes per session: Not on file  . Stress: Not on file  Relationships  . Social connections:    Talks on phone: Not on file    Gets together: Not on file    Attends religious service: Not on file    Active member of club or organization: Not on file    Attends meetings of clubs or organizations: Not on file    Relationship status: Not on file  Other Topics Concern  . Not on file  Social History Narrative  . Not on file   Past Surgical History:  Procedure Laterality Date  . CHOLECYSTECTOMY  2012  . CRANIECTOMY FOR DEPRESSED SKULL FRACTURE Right 03/28/2018   Procedure: CRANIECTOMY for cerebral edema with Placement of Skull Flap in Abdomen;  Surgeon: Consuella Lose, MD;  Location: [facility];  Service: Neurosurgery;  Laterality: Right;  . CRANIOTOMY Right 03/24/2018   Procedure: CRANIOTOMY, HEMATOMA EVACUATION SUBDURAL, RIGHT SIDE;  Surgeon: Erline Levine, MD;  Location: [facility];  Service: Neurosurgery;  Laterality: Right;  . CRANIOTOMY Right 03/24/2018   Procedure: CRANIOTOMY HEMATOMA EVACUATION EPIDURAL;  Surgeon: Kristeen Miss, MD;  Location: [facility];  Service: Neurosurgery;  Laterality: Right;  . LIPOMA EXCISION     lipoma on forehead   . WISDOM TOOTH EXTRACTION     Past Medical History:  Diagnosis Date  . Hyperlipidemia    BP 112/72   Pulse 79   Ht 5\' 4"  (1.626 m)   Wt 150 lb 6.4 oz (68.2 kg)   SpO2 97%   BMI 25.82 kg/m   Opioid Risk Score:   Fall Risk Score:  `1  Depression screen PHQ 2/9  Depression screen [facility] 2/9 05/27/2018 04/24/2018  Decreased Interest 0 0  Down, Depressed, Hopeless 0 0  PHQ - 2 Score 0 0  Altered sleeping - 2  Tired,  decreased energy - 1  Change in appetite - 2  Feeling bad or failure about yourself  - 0  Trouble concentrating - 0  Moving slowly or fidgety/restless - 0  Suicidal thoughts - 0  PHQ-9 Score - 5  Difficult doing work/chores - Not difficult at all    Review of Systems  Constitutional: Negative.   HENT: Negative.   Eyes: Negative.   Respiratory: Negative.   Cardiovascular: Negative.   Gastrointestinal: Negative.   Endocrine: Negative.   Genitourinary: Negative.   Musculoskeletal: Negative.   Skin: Negative.   Allergic/Immunologic: Negative.   Neurological: Negative.   Hematological: Negative.   Psychiatric/Behavioral: Negative.   All other systems reviewed and are negative.      Objective:   Physical Exam   General: Alert and oriented x 3, No apparent distress HEENT: Head is normocephalic, atraumatic, PERRLA, EOMI, sclera anicteric, oral mucosa pink and moist, dentition intact, ext ear canals clear,  Neck: Supple without JVD or lymphadenopathy Heart: Reg rate and rhythm. No murmurs rubs or gallops Chest: CTA bilaterally without wheezes, rales, or rhonchi; no distress Abdomen: Soft, non-tender, non-distended, bowel sounds positive. Extremities: No clubbing, cyanosis, or edema. Pulses are 2+ Skin: Clean and intact without signs of breakdown. Flap site clean.  Neuro: Pt is cognitively appropriate with normal insight, memory, and awareness. Cranial nerves 2-12 are intact. Sensory exam is normal. Reflexes are 2+ in all 4's. Fine motor coordination is intact. No tremors. Motor function is grossly 5/5. No processing delays, some difficulties with language due culture. Remembers 3 words after 5 minutes Musculoskeletal: Full ROM, No pain with AROM or PROM in the neck, trunk, or extremities. Posture appropriate Psych: Pt's affect is appropriate. Pt is cooperative        Assessment & Plan:  1.Decreased functional mobility with apraxiasecondary to right SDH/EDHwith initial  craniotomy evacuation hematoma 7/56/4332 complicated by epidural hematoma with evacuation 03/25/2018 and follow-up decompressive right frontal parietal craniectomy implantation of bone flap 03/28/2018.             -may conclude outpt therapies  -There are some cultural barriers  -I think he's not too far from baseline  -probably can return to work after flap 2.   Pain Management:continue topamax  -will not resume propranolol 3. .Seizure prophylaxis. Keppra 1000 mg every 12 hours        Fifteen minutes of face to face patient care time were spent during this visit. All questions  were encouraged and answered.  Follow up in a month

## 2018-05-28 ENCOUNTER — Ambulatory Visit: Payer: 59

## 2018-05-28 ENCOUNTER — Encounter: Payer: 59 | Admitting: Occupational Therapy

## 2018-05-28 DIAGNOSIS — R41841 Cognitive communication deficit: Secondary | ICD-10-CM | POA: Diagnosis not present

## 2018-05-28 NOTE — Patient Instructions (Signed)
   When you return to work, suggest you to have a "shadow" that makes sure you are doing everything correctly , for the first 5-7 days you are back to work.

## 2018-05-28 NOTE — Therapy (Signed)
[facility] [address] [place], [place], [address] Phone: [phone]   Fax:  [phone]  Speech Language Pathology Treatment/Discharge  Patient Details  Name: Barry Miranda MRN: [id number] Date of Birth: 20-Feb-1961 Referring Provider: Dr. Naaman Plummer    Encounter Date: 05/28/2018  End of Session - 05/28/18 1707    Visit Number  9    Number of Visits  17    Date for SLP Re-Evaluation  06/14/18    SLP Start Time  1406   5 minutes late   SLP Stop Time   1445    SLP Time Calculation (min)  39 min    Activity Tolerance  Patient tolerated treatment well       Past Medical History:  Diagnosis Date  . Hyperlipidemia     Past Surgical History:  Procedure Laterality Date  . CHOLECYSTECTOMY  2012  . CRANIECTOMY FOR DEPRESSED SKULL FRACTURE Right 03/28/2018   Procedure: CRANIECTOMY for cerebral edema with Placement of Skull Flap in Abdomen;  Surgeon: Consuella Lose, MD;  Location: [facility];  Service: Neurosurgery;  Laterality: Right;  . CRANIOTOMY Right 03/24/2018   Procedure: CRANIOTOMY, HEMATOMA EVACUATION SUBDURAL, RIGHT SIDE;  Surgeon: Erline Levine, MD;  Location: [facility];  Service: Neurosurgery;  Laterality: Right;  . CRANIOTOMY Right 03/24/2018   Procedure: CRANIOTOMY HEMATOMA EVACUATION EPIDURAL;  Surgeon: Kristeen Miss, MD;  Location: [facility];  Service: Neurosurgery;  Laterality: Right;  . LIPOMA EXCISION     lipoma on forehead   . WISDOM TOOTH EXTRACTION      There were no vitals filed for this visit.  Subjective Assessment - 05/28/18 1410    Subjective  "I met the Dr. Wilburn Mylar. I feel like I have enough for the visit now. He said to tell you that what I have is good."    Currently in Pain?  No/denies            ADULT SLP TREATMENT - 05/28/18 1411      General Information   Behavior/Cognition  Cooperative;Alert;Pleasant mood      Treatment Provided   Treatment provided  Cognitive-Linquistic      Cognitive-Linquistic Treatment   Treatment focused on  Cognition    Skilled Treatment  Pt arrived with helmet on. Pt 5 minutes late for ST. He told SLP he would like today to be his last day in ST. "I had enough therapy - the Dr. told me to tell you that." SLP re-tested with Cognitive Linguistic Quick Test (CLQT) and pt scored overall in WNL range at 3.6 (WNL= 3.5-4.0). Biggest deficit area was memory. Non-linguistic cognition composite score was also calculated due to pt's English as second langauge status - mild deficit was ID'd. Pt had difficulty with generative naming (possible langauge component), and with figuring mazes -due to timing out (processing deficit suspected),  Pt's clock drawing was perfect - pt used pencil as straightedge for making the clock hands and placed measure marks at 90 degrees on the circle used as the clock face, in order to accurately place numbers. Pt attention severity rating was one point from mod impairment and pt demo'd some impulsive behavior (mild) during the test beginning one subtest prior to SLP giving pt all instructions. Attention to detail and emergent awareness were also seen as a possible cont'd deficit area as pt made 2 errors with trail making task (on different tasks) and req'd SLP A to note them.       Assessment / Recommendations / Plan  Plan  Discharge SLP treatment due to (comment)   pt request     Progression Toward Goals   Progression toward goals  --   d/c day - see goal update      SLP Education - 05/28/18 1707    Education Details  need a "shadow" when pt returns to work    [employer]) Educated  Patient    Methods  Explanation    Comprehension  Verbalized understanding       SLP Short Term Goals - 05/23/18 0944      SLP SHORT TERM GOAL #1   Title  pt will tell two cognitive-linguistic deficits to SLP, over two sessions    Status  Not Met      SLP SHORT TERM GOAL #2   Title  pt will demo sustained attention for 10 minutes of therapy  tasks in quiet environment x3 sessions     Status  Achieved      SLP [place] #3   Title  Pt will perform min complex organization/planning/sequencing/finance tasks with 85% accuracy over 3 sessions     Status  Partially Met      SLP SHORT TERM GOAL #4   Title  pt will initiate functional use of a memory enhancing system/device     Status  Achieved       SLP Long Term Goals - 05/28/18 1421      SLP LONG TERM GOAL #1   Title  pt will demo knowledge of deficits by self-correcting 90% of errors in ST over 4 sessions    Time  3    Status  Not Met   requires mod A for error awareness     SLP LONG TERM GOAL #2   Title  pt will demo 15 minutes alternating attention between mod complex tasks with >95% accuracy over 3 sessions    Time  3    Period  Weeks    Status  Deferred   not targetedat this level prior to d/c     SLP LONG TERM GOAL #3   Title  Pt will complete mod complex organization/planning/sequencing/finance tasks with 90% success over three sessions     Time  --    Period  --    Status  Not Met      SLP LONG TERM GOAL #4   Title  pt will use memory strategies/system in or between 5 sessions     Baseline  05/16/18, 05/23/18, 05-27-18    Time  3    Period  Weeks    Status  Partially Met   3/5 sessions      Plan - 05/28/18 1708    Clinical Impression Statement  Patient presents with cont'd cognitive communication impairments in higher level areas as observed/demonstrated on the CLQT. Non-linguistic cognition composite score places the patient in the mildly cognitively impaired range, and severity rating rate pt overall as WNL (one tenth of a point from cutoff of mild cognitive impairment severity). Processing deficits were also demonstrated today during testing. See "skilled treatment" for more details. Pt would like to be discharged today, telling SLP he thinks he has "had enough" therapy.     Speech Therapy Frequency  --    Treatment/Interventions  Environmental  controls;Cognitive reorganization;Compensatory strategies;Compensatory techniques;Cueing hierarchy;Internal/external aids;Functional tasks;SLP instruction and feedback;Patient/family education    Potential to Achieve Goals  Good    Consulted and Agree with Plan of Care  Patient       Patient  will benefit from skilled therapeutic intervention in order to improve the following deficits and impairments:   Cognitive communication deficit   SPEECH THERAPY DISCHARGE SUMMARY  Visits from Start of Care: 9  Current functional level related to goals / functional outcomes: Pt demonstrated deficits remaining in emergent awareness, higher level attention, memory, executive function, and in processing speed. See "skilled treatment" for more details. Pt stated to SLP today he thought he had enough therapy at this time.    Remaining deficits: Mod cognitive-linguistic impairments.    Education / Equipment: Deficit areas, pt would likely need "shadow" when/if he returns to work in order to ensure pt maintains proper procedures and completes his work duties in a safe manner.   Plan: Patient agrees to discharge.  Patient goals were partially met. Patient is being discharged due to the patient's request.  ?????       Problem List Patient Active Problem List   Diagnosis Date Noted  . Postoperative pain   . Sleep disturbance   . Hyperglycemia   . Vascular headache   . Executive function deficit   . Labile blood pressure   . Hypokalemia   . Hypoalbuminemia due to protein-calorie malnutrition ([place])   . Leukocytosis   . Dental caries   . Midline shift of brain   . Subdural bleeding ([place])   . Hyperlipidemia   . Tobacco abuse   . Sinus tachycardia   . Reactive hypertension   . Hypernatremia   . Acute blood loss anemia   . Encounter for intubation   . Epidural hematoma ([place]) 03/25/2018  . Acute respiratory failure ([place])   . Non-traumatic acute subdural hematoma ([place]) 03/24/2018  . Subdural  hematoma ([place]) 03/24/2018  . Chronic calculus cholecystitis 09/11/2011    [facility] ,MS, CCC-SLP  05/28/2018, 5:12 PM  [facility] [address] [place], [place], [address] Phone: [phone]   Fax:  [phone]   Name: JULEZ HUSEBY MRN: [id number] Date of Birth: 1961-05-07

## 2018-05-30 ENCOUNTER — Encounter: Payer: 59 | Admitting: Speech Pathology

## 2018-05-30 ENCOUNTER — Ambulatory Visit: Payer: 59 | Admitting: Physical Therapy

## 2018-05-30 ENCOUNTER — Encounter: Payer: 59 | Admitting: Occupational Therapy

## 2018-06-03 ENCOUNTER — Encounter: Payer: 59 | Admitting: Occupational Therapy

## 2018-06-03 ENCOUNTER — Ambulatory Visit: Payer: 59 | Admitting: Physical Therapy

## 2018-06-05 ENCOUNTER — Encounter: Payer: 59 | Admitting: Occupational Therapy

## 2018-06-05 ENCOUNTER — Ambulatory Visit: Payer: 59 | Admitting: Physical Therapy

## 2018-06-11 ENCOUNTER — Encounter: Payer: 59 | Admitting: Occupational Therapy

## 2018-06-12 ENCOUNTER — Encounter: Payer: 59 | Admitting: Occupational Therapy

## 2018-06-12 ENCOUNTER — Ambulatory Visit: Payer: 59 | Admitting: Physical Therapy

## 2018-06-13 ENCOUNTER — Ambulatory Visit: Payer: 59 | Admitting: Physical Therapy

## 2018-06-13 ENCOUNTER — Encounter: Payer: 59 | Admitting: Occupational Therapy

## 2018-06-13 ENCOUNTER — Encounter: Payer: 59 | Admitting: Speech Pathology

## 2018-06-14 ENCOUNTER — Encounter (HOSPITAL_COMMUNITY): Payer: Self-pay

## 2018-06-14 ENCOUNTER — Other Ambulatory Visit (HOSPITAL_COMMUNITY): Payer: 59

## 2018-06-14 ENCOUNTER — Other Ambulatory Visit: Payer: Self-pay

## 2018-06-14 ENCOUNTER — Encounter (HOSPITAL_COMMUNITY)
Admission: RE | Admit: 2018-06-14 | Discharge: 2018-06-14 | Disposition: A | Discharge status: Home / Self Care | Payer: 59 | Source: Ambulatory Visit | Attending: Neurosurgery | Admitting: Neurosurgery

## 2018-06-14 DIAGNOSIS — Z01812 Encounter for preprocedural laboratory examination: Secondary | ICD-10-CM | POA: Diagnosis present

## 2018-06-14 LAB — CBC
HEMATOCRIT: 47.9 % (ref 39.0–52.0)
Hemoglobin: 15.9 g/dL (ref 13.0–17.0)
MCH: 29.9 pg (ref 26.0–34.0)
MCHC: 33.2 g/dL (ref 30.0–36.0)
MCV: 90.2 fL (ref 78.0–100.0)
PLATELETS: 235 10*3/uL (ref 150–400)
RBC: 5.31 MIL/uL (ref 4.22–5.81)
RDW: 12.7 % (ref 11.5–15.5)
WBC: 6.1 10*3/uL (ref 4.0–10.5)

## 2018-06-14 NOTE — Progress Notes (Signed)
PCP - Dineen Kid, MD Cardiologist - denies  Chest x-ray - N/A EKG - N/A Stress Test - denies  ECHO - denies Cardiac Cath - denies  Sleep Study - denies  Blood Thinner Instructions: N/A Aspirin Instructions:N/A  Anesthesia review: No  Patient denies shortness of breath, fever, cough and chest pain at PAT appointment   Patient verbalized understanding of instructions that were given to them at the PAT appointment. Patient was also instructed that they will need to review over the PAT instructions again at home before surgery.

## 2018-06-14 NOTE — Pre-Procedure Instructions (Signed)
KEI MCELHINEY  06/14/2018      [id number] [address], [place] [address] Phone: [phone] Fax: [phone]    Your procedure is scheduled on Thursday, September 12th .  Report to [facility] Admitting at 9 A.M.  Call this number if you have problems the morning of surgery:  [phone]   Remember:  Do not eat or drink after midnight.    Take these medicines the morning of surgery with A SIP OF WATER   famotidine (PEPCID) 20 MG tablet levETIRAcetam (KEPPRA) 1000 MG tablet  7 days prior to surgery STOP taking any Aspirin(unless otherwise instructed by your surgeon), Aleve, Naproxen, Ibuprofen, Motrin, Advil, Goody's, BC's, all herbal medications, fish oil, and all vitamins   Do not wear jewelry, make-up or nail polish.  Do not wear lotions, powders, or perfumes, or deodorant.  Do not shave 48 hours prior to surgery.  Men may shave face and neck.  Do not bring valuables to the hospital.  [facility] is not responsible for any belongings or valuables.  Contacts, dentures or bridgework may not be worn into surgery.  Leave your suitcase in the car.  After surgery it may be brought to your room.  For patients admitted to the hospital, discharge time will be determined by your treatment team.  Patients discharged the day of surgery will not be allowed to drive home.   Special instructions:   [facility]- Preparing For Surgery  Before surgery, you can play an important role. Because skin is not sterile, your skin needs to be as free of germs as possible. You can reduce the number of germs on your skin by washing with CHG (chlorahexidine gluconate) Soap before surgery.  CHG is an antiseptic cleaner which kills germs and bonds with the skin to continue killing germs even after washing.    Oral Hygiene is also important to reduce your risk of infection.  Remember - BRUSH YOUR TEETH THE MORNING OF SURGERY WITH  YOUR REGULAR TOOTHPASTE  Please do not use if you have an allergy to CHG or antibacterial soaps. If your skin becomes reddened/irritated stop using the CHG.  Do not shave (including legs and underarms) for at least 48 hours prior to first CHG shower. It is OK to shave your face.  Please follow these instructions carefully.   1. Shower the NIGHT BEFORE SURGERY and the MORNING OF SURGERY with CHG.   2. If you chose to wash your hair, wash your hair first as usual with your normal shampoo.  3. After you shampoo, rinse your hair and body thoroughly to remove the shampoo.  4. Use CHG as you would any other liquid soap. You can apply CHG directly to the skin and wash gently with a scrungie or a clean washcloth.   5. Apply the CHG Soap to your body ONLY FROM THE NECK DOWN.  Do not use on open wounds or open sores. Avoid contact with your eyes, ears, mouth and genitals (private parts). Wash Face and genitals (private parts)  with your normal soap.  6. Wash thoroughly, paying special attention to the area where your surgery will be performed.  7. Thoroughly rinse your body with warm water from the neck down.  8. DO NOT shower/wash with your normal soap after using and rinsing off the CHG Soap.  9. Pat yourself dry with a CLEAN TOWEL.  10. Wear CLEAN PAJAMAS to bed the night before  surgery, wear comfortable clothes the morning of surgery  11. Place CLEAN SHEETS on your bed the night of your first shower and DO NOT SLEEP WITH PETS.    Day of Surgery:  Do not apply any deodorants/lotions.  Please wear clean clothes to the hospital/surgery center.   Remember to brush your teeth WITH YOUR REGULAR TOOTHPASTE.  Please read over the following fact sheets that you were given.

## 2018-06-19 ENCOUNTER — Ambulatory Visit: Payer: 59 | Admitting: Physical Therapy

## 2018-06-19 ENCOUNTER — Encounter: Payer: 59 | Admitting: Occupational Therapy

## 2018-06-20 ENCOUNTER — Inpatient Hospital Stay (HOSPITAL_COMMUNITY): Payer: 59 | Admitting: Certified Registered"

## 2018-06-20 ENCOUNTER — Inpatient Hospital Stay (HOSPITAL_COMMUNITY)
Admission: RE | Admit: 2018-06-20 | Discharge: 2018-06-22 | DRG: 027 | Disposition: A | Discharge status: Home / Self Care | Payer: 59 | Source: Ambulatory Visit | Attending: Neurosurgery | Admitting: Neurosurgery

## 2018-06-20 ENCOUNTER — Encounter (HOSPITAL_COMMUNITY)
Admission: RE | Disposition: A | Discharge status: Home / Self Care | Payer: Self-pay | Source: Ambulatory Visit | Attending: Neurosurgery

## 2018-06-20 ENCOUNTER — Encounter (HOSPITAL_COMMUNITY): Payer: Self-pay | Admitting: Anesthesiology

## 2018-06-20 ENCOUNTER — Other Ambulatory Visit: Payer: Self-pay

## 2018-06-20 DIAGNOSIS — X58XXXA Exposure to other specified factors, initial encounter: Secondary | ICD-10-CM | POA: Diagnosis present

## 2018-06-20 DIAGNOSIS — M952 Other acquired deformity of head: Secondary | ICD-10-CM | POA: Diagnosis present

## 2018-06-20 DIAGNOSIS — Z79899 Other long term (current) drug therapy: Secondary | ICD-10-CM | POA: Diagnosis not present

## 2018-06-20 DIAGNOSIS — S065X9A Traumatic subdural hemorrhage with loss of consciousness of unspecified duration, initial encounter: Principal | ICD-10-CM | POA: Diagnosis present

## 2018-06-20 DIAGNOSIS — Z87891 Personal history of nicotine dependence: Secondary | ICD-10-CM | POA: Diagnosis not present

## 2018-06-20 DIAGNOSIS — I6200 Nontraumatic subdural hemorrhage, unspecified: Secondary | ICD-10-CM | POA: Diagnosis not present

## 2018-06-20 DIAGNOSIS — Z9889 Other specified postprocedural states: Secondary | ICD-10-CM

## 2018-06-20 DIAGNOSIS — I6201 Nontraumatic acute subdural hemorrhage: Secondary | ICD-10-CM | POA: Diagnosis not present

## 2018-06-20 HISTORY — PX: CRANIOPLASTY: SHX1407

## 2018-06-20 HISTORY — PX: CRANIOTOMY: SHX93

## 2018-06-20 LAB — TYPE AND SCREEN
ABO/RH(D): O POS
ABO/RH(D): O POS
Antibody Screen: NEGATIVE
Antibody Screen: NEGATIVE

## 2018-06-20 LAB — BASIC METABOLIC PANEL
Anion gap: 10 (ref 5–15)
BUN: 11 mg/dL (ref 6–20)
CALCIUM: 9.2 mg/dL (ref 8.9–10.3)
CO2: 22 mmol/L (ref 22–32)
CREATININE: 0.84 mg/dL (ref 0.61–1.24)
Chloride: 108 mmol/L (ref 98–111)
GFR calc non Af Amer: >60 mL/min (ref >60)
Glucose, Bld: 91 mg/dL (ref 70–99)
Potassium: 4.1 mmol/L (ref 3.5–5.1)
SODIUM: 140 mmol/L (ref 135–145)

## 2018-06-20 LAB — MRSA PCR SCREENING: MRSA by PCR: NEGATIVE

## 2018-06-20 SURGERY — CRANIOPLASTY
Anesthesia: General | Laterality: Right

## 2018-06-20 MED ORDER — ONDANSETRON HCL 4 MG/2ML IJ SOLN
INTRAMUSCULAR | Status: DC | PRN
  Administered 2018-06-20: 4 mg via INTRAVENOUS

## 2018-06-20 MED ORDER — 0.9 % SODIUM CHLORIDE (POUR BTL) OPTIME
TOPICAL | Status: DC | PRN
  Administered 2018-06-20 (×2): 1000 mL

## 2018-06-20 MED ORDER — ACETAMINOPHEN 650 MG RE SUPP: 650.0000 mg | RECTAL | Status: DC | PRN

## 2018-06-20 MED ORDER — MIDAZOLAM HCL 5 MG/5ML IJ SOLN
INTRAMUSCULAR | Status: DC | PRN
  Administered 2018-06-20: 2 mg via INTRAVENOUS

## 2018-06-20 MED ORDER — ONDANSETRON HCL 4 MG PO TABS: 4.0000 mg | ORAL_TABLET | ORAL | Status: DC | PRN

## 2018-06-20 MED ORDER — LABETALOL HCL 5 MG/ML IV SOLN
INTRAVENOUS | Status: AC
  Filled 2018-06-20: qty 4

## 2018-06-20 MED ORDER — LEVETIRACETAM 500 MG PO TABS: 1000.0000 mg | ORAL_TABLET | Freq: Two times a day (BID) | ORAL | Status: DC

## 2018-06-20 MED ORDER — MICROFIBRILLAR COLL HEMOSTAT EX PADS
MEDICATED_PAD | CUTANEOUS | Status: DC | PRN
  Administered 2018-06-20: 1 via TOPICAL

## 2018-06-20 MED ORDER — LIDOCAINE 2% (20 MG/ML) 5 ML SYRINGE
INTRAMUSCULAR | Status: AC
  Filled 2018-06-20: qty 10

## 2018-06-20 MED ORDER — ROCURONIUM BROMIDE 50 MG/5ML IV SOSY
PREFILLED_SYRINGE | INTRAVENOUS | Status: DC | PRN
  Administered 2018-06-20: 50 mg via INTRAVENOUS
  Administered 2018-06-20: 10 mg via INTRAVENOUS

## 2018-06-20 MED ORDER — THROMBIN 5000 UNITS EX SOLR
CUTANEOUS | Status: AC
  Filled 2018-06-20: qty 5000

## 2018-06-20 MED ORDER — MELATONIN 3 MG PO TABS: 3.0000 mg | ORAL_TABLET | Freq: Every day | ORAL | Status: DC

## 2018-06-20 MED ORDER — BISACODYL 10 MG RE SUPP: 10.0000 mg | Freq: Every day | RECTAL | Status: DC | PRN

## 2018-06-20 MED ORDER — LIDOCAINE-EPINEPHRINE 1 %-1:100000 IJ SOLN
INTRAMUSCULAR | Status: DC | PRN
  Administered 2018-06-20: 15 mL via INTRADERMAL

## 2018-06-20 MED ORDER — CEFAZOLIN SODIUM-DEXTROSE 1-4 GM/50ML-% IV SOLN
1.0000 g | Freq: Three times a day (TID) | INTRAVENOUS | Status: AC
  Administered 2018-06-20 – 2018-06-21 (×2): 1 g via INTRAVENOUS
  Filled 2018-06-20 (×2): qty 50

## 2018-06-20 MED ORDER — FENTANYL CITRATE (PF) 100 MCG/2ML IJ SOLN
INTRAMUSCULAR | Status: AC
  Filled 2018-06-20: qty 2

## 2018-06-20 MED ORDER — PROPOFOL 10 MG/ML IV BOLUS
INTRAVENOUS | Status: AC
  Filled 2018-06-20: qty 20

## 2018-06-20 MED ORDER — SODIUM CHLORIDE 0.9 % IV SOLN
INTRAVENOUS | Status: DC | PRN
  Administered 2018-06-20 – 2018-06-22 (×2): 250 mL via INTRAVENOUS

## 2018-06-20 MED ORDER — PHENYLEPHRINE HCL 10 MG/ML IJ SOLN
INTRAMUSCULAR | Status: DC | PRN
  Administered 2018-06-20: 40 ug via INTRAVENOUS

## 2018-06-20 MED ORDER — OXYCODONE HCL 5 MG PO TABS
ORAL_TABLET | ORAL | Status: AC
  Filled 2018-06-20: qty 1

## 2018-06-20 MED ORDER — FENTANYL CITRATE (PF) 100 MCG/2ML IJ SOLN
INTRAMUSCULAR | Status: DC | PRN
  Administered 2018-06-20: 50 ug via INTRAVENOUS
  Administered 2018-06-20: 100 ug via INTRAVENOUS
  Administered 2018-06-20 (×2): 50 ug via INTRAVENOUS

## 2018-06-20 MED ORDER — LIDOCAINE-EPINEPHRINE 1 %-1:100000 IJ SOLN
INTRAMUSCULAR | Status: AC
  Filled 2018-06-20: qty 1

## 2018-06-20 MED ORDER — BACITRACIN ZINC 500 UNIT/GM EX OINT
TOPICAL_OINTMENT | CUTANEOUS | Status: DC | PRN
  Administered 2018-06-20: 1 via TOPICAL

## 2018-06-20 MED ORDER — DEXAMETHASONE SODIUM PHOSPHATE 10 MG/ML IJ SOLN
INTRAMUSCULAR | Status: DC | PRN
  Administered 2018-06-20: 10 mg via INTRAVENOUS

## 2018-06-20 MED ORDER — LEVETIRACETAM IN NACL 500 MG/100ML IV SOLN
500.0000 mg | Freq: Two times a day (BID) | INTRAVENOUS | Status: DC
  Administered 2018-06-20 – 2018-06-22 (×4): 500 mg via INTRAVENOUS
  Filled 2018-06-20 (×5): qty 100

## 2018-06-20 MED ORDER — LABETALOL HCL 5 MG/ML IV SOLN
10.0000 mg | INTRAVENOUS | Status: DC | PRN
  Administered 2018-06-20: 20 mg via INTRAVENOUS
  Administered 2018-06-20 (×3): 40 mg via INTRAVENOUS
  Filled 2018-06-20 (×2): qty 8

## 2018-06-20 MED ORDER — CHOLECALCIFEROL 50 MCG (2000 UT) PO TABS: 2000.0000 [IU] | ORAL_TABLET | Freq: Every evening | ORAL | Status: DC

## 2018-06-20 MED ORDER — MORPHINE SULFATE (PF) 2 MG/ML IV SOLN
2.0000 mg | INTRAVENOUS | Status: DC | PRN
  Administered 2018-06-20: 2 mg via INTRAVENOUS
  Filled 2018-06-20: qty 1

## 2018-06-20 MED ORDER — ONDANSETRON HCL 4 MG/2ML IJ SOLN: 4.0000 mg | INTRAMUSCULAR | Status: DC | PRN

## 2018-06-20 MED ORDER — PHENYLEPHRINE 40 MCG/ML (10ML) SYRINGE FOR IV PUSH (FOR BLOOD PRESSURE SUPPORT)
PREFILLED_SYRINGE | INTRAVENOUS | Status: AC
  Filled 2018-06-20: qty 20

## 2018-06-20 MED ORDER — ONDANSETRON HCL 4 MG/2ML IJ SOLN: 4.0000 mg | Freq: Once | INTRAMUSCULAR | Status: DC | PRN

## 2018-06-20 MED ORDER — FENTANYL CITRATE (PF) 100 MCG/2ML IJ SOLN
25.0000 ug | INTRAMUSCULAR | Status: DC | PRN
  Administered 2018-06-20 (×3): 50 ug via INTRAVENOUS

## 2018-06-20 MED ORDER — SUGAMMADEX SODIUM 200 MG/2ML IV SOLN
INTRAVENOUS | Status: DC | PRN
  Administered 2018-06-20: 200 mg via INTRAVENOUS

## 2018-06-20 MED ORDER — CEFAZOLIN SODIUM-DEXTROSE 2-4 GM/100ML-% IV SOLN
2.0000 g | INTRAVENOUS | Status: AC
  Administered 2018-06-20: 2 g via INTRAVENOUS

## 2018-06-20 MED ORDER — ACETAMINOPHEN 325 MG PO TABS: 650.0000 mg | ORAL_TABLET | ORAL | Status: DC | PRN

## 2018-06-20 MED ORDER — ROCURONIUM BROMIDE 50 MG/5ML IV SOSY
PREFILLED_SYRINGE | INTRAVENOUS | Status: AC
  Filled 2018-06-20: qty 15

## 2018-06-20 MED ORDER — SODIUM CHLORIDE 0.9 % IV SOLN
INTRAVENOUS | Status: DC | PRN
  Administered 2018-06-20: 50 ug/min via INTRAVENOUS

## 2018-06-20 MED ORDER — POTASSIUM CHLORIDE IN NACL 20-0.9 MEQ/L-% IV SOLN
INTRAVENOUS | Status: DC
  Administered 2018-06-20 – 2018-06-22 (×2): via INTRAVENOUS
  Filled 2018-06-20 (×3): qty 1000

## 2018-06-20 MED ORDER — ONDANSETRON HCL 4 MG/2ML IJ SOLN
INTRAMUSCULAR | Status: AC
  Filled 2018-06-20: qty 4

## 2018-06-20 MED ORDER — BUPIVACAINE HCL (PF) 0.5 % IJ SOLN
INTRAMUSCULAR | Status: AC
  Filled 2018-06-20: qty 30

## 2018-06-20 MED ORDER — FENTANYL CITRATE (PF) 250 MCG/5ML IJ SOLN
INTRAMUSCULAR | Status: AC
  Filled 2018-06-20: qty 5

## 2018-06-20 MED ORDER — LIDOCAINE 2% (20 MG/ML) 5 ML SYRINGE
INTRAMUSCULAR | Status: DC | PRN
  Administered 2018-06-20: 60 mg via INTRAVENOUS

## 2018-06-20 MED ORDER — LIDOCAINE-EPINEPHRINE 0.5 %-1:200000 IJ SOLN
INTRAMUSCULAR | Status: AC
  Filled 2018-06-20: qty 1

## 2018-06-20 MED ORDER — CHLORHEXIDINE GLUCONATE CLOTH 2 % EX PADS: 6.0000 | MEDICATED_PAD | Freq: Once | CUTANEOUS | Status: DC

## 2018-06-20 MED ORDER — DEXAMETHASONE SODIUM PHOSPHATE 10 MG/ML IJ SOLN
INTRAMUSCULAR | Status: AC
  Filled 2018-06-20: qty 2

## 2018-06-20 MED ORDER — DOCUSATE SODIUM 100 MG PO CAPS
100.0000 mg | ORAL_CAPSULE | Freq: Two times a day (BID) | ORAL | Status: DC
  Administered 2018-06-21 – 2018-06-22 (×3): 100 mg via ORAL
  Filled 2018-06-20 (×4): qty 1

## 2018-06-20 MED ORDER — SIMVASTATIN 20 MG PO TABS
20.0000 mg | ORAL_TABLET | Freq: Every day | ORAL | Status: DC
  Administered 2018-06-20 – 2018-06-21 (×2): 20 mg via ORAL
  Filled 2018-06-20 (×2): qty 1

## 2018-06-20 MED ORDER — MIDAZOLAM HCL 2 MG/2ML IJ SOLN
INTRAMUSCULAR | Status: AC
  Filled 2018-06-20: qty 2

## 2018-06-20 MED ORDER — SODIUM CHLORIDE 0.9 % IV SOLN
INTRAVENOUS | Status: DC
  Administered 2018-06-20 (×3): via INTRAVENOUS

## 2018-06-20 MED ORDER — PROMETHAZINE HCL 25 MG PO TABS: 12.5000 mg | ORAL_TABLET | ORAL | Status: DC | PRN

## 2018-06-20 MED ORDER — SENNOSIDES-DOCUSATE SODIUM 8.6-50 MG PO TABS: 1.0000 | ORAL_TABLET | Freq: Every evening | ORAL | Status: DC | PRN

## 2018-06-20 MED ORDER — FAMOTIDINE IN NACL 20-0.9 MG/50ML-% IV SOLN
20.0000 mg | Freq: Two times a day (BID) | INTRAVENOUS | Status: DC
  Administered 2018-06-20 – 2018-06-21 (×3): 20 mg via INTRAVENOUS
  Filled 2018-06-20 (×3): qty 50

## 2018-06-20 MED ORDER — POLYETHYLENE GLYCOL 3350 17 G PO PACK: 17.0000 g | PACK | Freq: Every day | ORAL | Status: DC | PRN

## 2018-06-20 MED ORDER — HYDROCODONE-ACETAMINOPHEN 5-325 MG PO TABS
1.0000 | ORAL_TABLET | ORAL | Status: DC | PRN
  Administered 2018-06-21 – 2018-06-22 (×7): 1 via ORAL
  Filled 2018-06-20 (×7): qty 1

## 2018-06-20 MED ORDER — BUTALBITAL-APAP-CAFFEINE 50-325-40 MG PO TABS: 1.0000 | ORAL_TABLET | ORAL | Status: DC | PRN

## 2018-06-20 MED ORDER — CEFAZOLIN SODIUM-DEXTROSE 2-4 GM/100ML-% IV SOLN
INTRAVENOUS | Status: AC
  Filled 2018-06-20: qty 100

## 2018-06-20 MED ORDER — TOPIRAMATE 25 MG PO TABS
50.0000 mg | ORAL_TABLET | Freq: Every day | ORAL | Status: DC
  Administered 2018-06-20 – 2018-06-21 (×2): 50 mg via ORAL
  Filled 2018-06-20 (×2): qty 2

## 2018-06-20 MED ORDER — FAMOTIDINE 20 MG PO TABS: 20.0000 mg | ORAL_TABLET | Freq: Two times a day (BID) | ORAL | Status: DC

## 2018-06-20 MED ORDER — BUPIVACAINE HCL (PF) 0.5 % IJ SOLN
INTRAMUSCULAR | Status: DC | PRN
  Administered 2018-06-20: 15 mL

## 2018-06-20 MED ORDER — OXYCODONE HCL 5 MG/5ML PO SOLN: 5.0000 mg | Freq: Once | ORAL | Status: AC | PRN

## 2018-06-20 MED ORDER — PROPOFOL 10 MG/ML IV BOLUS
INTRAVENOUS | Status: DC | PRN
  Administered 2018-06-20: 200 mg via INTRAVENOUS

## 2018-06-20 MED ORDER — THROMBIN 5000 UNITS EX SOLR
OROMUCOSAL | Status: DC | PRN
  Administered 2018-06-20: 14:00:00 via TOPICAL

## 2018-06-20 MED ORDER — BACITRACIN ZINC 500 UNIT/GM EX OINT
TOPICAL_OINTMENT | CUTANEOUS | Status: AC
  Filled 2018-06-20: qty 28.35

## 2018-06-20 MED ORDER — OXYCODONE HCL 5 MG PO TABS
5.0000 mg | ORAL_TABLET | Freq: Once | ORAL | Status: AC | PRN
  Administered 2018-06-20: 5 mg via ORAL

## 2018-06-20 SURGICAL SUPPLY — 102 items
ADH SKN CLS APL DERMABOND .7 (GAUZE/BANDAGES/DRESSINGS) ×1
BANDAGE GAUZE 4  KLING STR (GAUZE/BANDAGES/DRESSINGS) IMPLANT
BASKET BONE COLLECTION (BASKET) IMPLANT
BATTERY IQ STERILE (MISCELLANEOUS) ×2 IMPLANT
BIT DRILL WIRE PASS 1.3MM (BIT) IMPLANT
BLADE CLIPPER SURG (BLADE) ×2 IMPLANT
BNDG CMPR 75X41 PLY HI ABS (GAUZE/BANDAGES/DRESSINGS)
BNDG GAUZE ELAST 4 BULKY (GAUZE/BANDAGES/DRESSINGS) IMPLANT
BNDG STRETCH 4X75 STRL LF (GAUZE/BANDAGES/DRESSINGS) IMPLANT
BTRY SRG DRVR 1.5 IQ (MISCELLANEOUS) ×1
BUR ACORN 6.0 PRECISION (BURR) ×2 IMPLANT
BUR ACORN 6.0MM PRECISION (BURR) ×1
BUR SPIRAL ROUTER 2.3 (BUR) IMPLANT
BUR SPIRAL ROUTER 2.3MM (BUR)
CABLE BIPOLOR RESECTION CORD (MISCELLANEOUS) ×3 IMPLANT
CANISTER SUCT 3000ML PPV (MISCELLANEOUS) ×6 IMPLANT
CARTRIDGE OIL MAESTRO DRILL (MISCELLANEOUS) ×2 IMPLANT
CATH ROBINSON RED A/P 12FR (CATHETERS) IMPLANT
CLIP RANEY DISP (INSTRUMENTS) IMPLANT
CLIP VESOCCLUDE MED 6/CT (CLIP) IMPLANT
COVER BACK TABLE 60X90IN (DRAPES) IMPLANT
DECANTER SPIKE VIAL GLASS SM (MISCELLANEOUS) ×7 IMPLANT
DERMABOND ADVANCED (GAUZE/BANDAGES/DRESSINGS) ×2
DERMABOND ADVANCED .7 DNX12 (GAUZE/BANDAGES/DRESSINGS) IMPLANT
DIFFUSER DRILL AIR PNEUMATIC (MISCELLANEOUS) ×4 IMPLANT
DRAIN JACKSON PRATT 10MM FLAT (MISCELLANEOUS) ×2 IMPLANT
DRAIN PENROSE 1/2X12 LTX STRL (WOUND CARE) IMPLANT
DRAPE NEUROLOGICAL W/INCISE (DRAPES) ×4 IMPLANT
DRAPE WARM FLUID 44X44 (DRAPE) ×4 IMPLANT
DRESSING TELFA 8X10 (GAUZE/BANDAGES/DRESSINGS) ×2 IMPLANT
DRILL WIRE PASS 1.3MM (BIT)
DRSG PAD ABDOMINAL 8X10 ST (GAUZE/BANDAGES/DRESSINGS) IMPLANT
DRSG TEGADERM 4X4.75 (GAUZE/BANDAGES/DRESSINGS) ×14 IMPLANT
DURAPREP 26ML APPLICATOR (WOUND CARE) ×4 IMPLANT
DURAPREP 6ML APPLICATOR 50/CS (WOUND CARE) ×2 IMPLANT
ELECT CAUTERY BLADE 6.4 (BLADE) ×3 IMPLANT
ELECT REM PT RETURN 9FT ADLT (ELECTROSURGICAL) ×3
ELECTRODE REM PT RTRN 9FT ADLT (ELECTROSURGICAL) ×2 IMPLANT
EVACUATOR SILICONE 100CC (DRAIN) ×2 IMPLANT
GAUZE 4X4 16PLY RFD (DISPOSABLE) IMPLANT
GAUZE SPONGE 4X4 12PLY STRL (GAUZE/BANDAGES/DRESSINGS) ×3 IMPLANT
GLOVE BIO SURGEON STRL SZ 6.5 (GLOVE) IMPLANT
GLOVE BIO SURGEON STRL SZ7 (GLOVE) IMPLANT
GLOVE BIO SURGEON STRL SZ7.5 (GLOVE) IMPLANT
GLOVE BIO SURGEON STRL SZ8 (GLOVE) ×6 IMPLANT
GLOVE BIO SURGEON STRL SZ8.5 (GLOVE) IMPLANT
GLOVE BIO SURGEONS STRL SZ 6.5 (GLOVE)
GLOVE BIOGEL M 8.0 STRL (GLOVE) IMPLANT
GLOVE BIOGEL PI IND STRL 8 (GLOVE) ×2 IMPLANT
GLOVE BIOGEL PI IND STRL 8.5 (GLOVE) ×2 IMPLANT
GLOVE BIOGEL PI INDICATOR 8 (GLOVE) ×4
GLOVE BIOGEL PI INDICATOR 8.5 (GLOVE) ×4
GLOVE ECLIPSE 6.5 STRL STRAW (GLOVE) IMPLANT
GLOVE ECLIPSE 7.0 STRL STRAW (GLOVE) IMPLANT
GLOVE ECLIPSE 8.0 STRL XLNG CF (GLOVE) ×6 IMPLANT
GLOVE ECLIPSE 8.5 STRL (GLOVE) IMPLANT
GLOVE EXAM NITRILE LRG STRL (GLOVE) IMPLANT
GLOVE EXAM NITRILE XL STR (GLOVE) IMPLANT
GLOVE EXAM NITRILE XS STR PU (GLOVE) IMPLANT
GLOVE INDICATOR 6.5 STRL GRN (GLOVE) IMPLANT
GLOVE INDICATOR 7.0 STRL GRN (GLOVE) IMPLANT
GLOVE INDICATOR 7.5 STRL GRN (GLOVE) IMPLANT
GLOVE OPTIFIT SS 8.0 STRL (GLOVE) IMPLANT
GLOVE SURG SS PI 6.5 STRL IVOR (GLOVE) IMPLANT
GOWN STRL REUS W/ TWL LRG LVL3 (GOWN DISPOSABLE) ×1 IMPLANT
GOWN STRL REUS W/ TWL XL LVL3 (GOWN DISPOSABLE) IMPLANT
GOWN STRL REUS W/TWL 2XL LVL3 (GOWN DISPOSABLE) ×4 IMPLANT
GOWN STRL REUS W/TWL LRG LVL3 (GOWN DISPOSABLE)
GOWN STRL REUS W/TWL XL LVL3 (GOWN DISPOSABLE) ×3
HEMOSTAT POWDER KIT SURGIFOAM (HEMOSTASIS) ×2 IMPLANT
HEMOSTAT SURGICEL 2X14 (HEMOSTASIS) ×4 IMPLANT
KIT BASIN OR (CUSTOM PROCEDURE TRAY) ×4 IMPLANT
KIT TURNOVER KIT B (KITS) ×4 IMPLANT
NDL HYPO 25X1 1.5 SAFETY (NEEDLE) ×2 IMPLANT
NEEDLE HYPO 25X1 1.5 SAFETY (NEEDLE) ×3 IMPLANT
NS IRRIG 1000ML POUR BTL (IV SOLUTION) ×4 IMPLANT
OIL CARTRIDGE MAESTRO DRILL (MISCELLANEOUS) ×3
PACK CRANIOTOMY CUSTOM (CUSTOM PROCEDURE TRAY) ×4 IMPLANT
PAD ARMBOARD 7.5X6 YLW CONV (MISCELLANEOUS) ×12 IMPLANT
PATTIES SURGICAL .5 X.5 (GAUZE/BANDAGES/DRESSINGS) IMPLANT
PATTIES SURGICAL .5 X3 (DISPOSABLE) IMPLANT
PATTIES SURGICAL 1X1 (DISPOSABLE) IMPLANT
PIN MAYFIELD SKULL DISP (PIN) IMPLANT
PLATE 1.5  2HOLE LNG NEURO (Plate) ×10 IMPLANT
PLATE 1.5 2HOLE LNG NEURO (Plate) IMPLANT
SCREW SELF DRILL HT 1.5/4MM (Screw) ×20 IMPLANT
SET CRAINOPLASTY (SET/KITS/TRAYS/PACK) ×3 IMPLANT
SPECIMEN JAR SMALL (MISCELLANEOUS) IMPLANT
SPONGE NEURO XRAY DETECT 1X3 (DISPOSABLE) IMPLANT
SPONGE SURGIFOAM ABS GEL 100 (HEMOSTASIS) ×1 IMPLANT
STAPLER SKIN PROX WIDE 3.9 (STAPLE) ×8 IMPLANT
SUT ETHILON 3 0 FSL (SUTURE) IMPLANT
SUT ETHILON 3 0 PS 1 (SUTURE) IMPLANT
SUT NURALON 4 0 TR CR/8 (SUTURE) ×4 IMPLANT
SUT VIC AB 2-0 CP2 18 (SUTURE) ×7 IMPLANT
SUT VIC AB 3-0 SH 8-18 (SUTURE) IMPLANT
SYR CONTROL 10ML LL (SYRINGE) ×3 IMPLANT
TOWEL GREEN STERILE (TOWEL DISPOSABLE) ×4 IMPLANT
TOWEL GREEN STERILE FF (TOWEL DISPOSABLE) ×4 IMPLANT
TRAY FOLEY MTR SLVR 16FR STAT (SET/KITS/TRAYS/PACK) ×2 IMPLANT
UNDERPAD 30X30 (UNDERPADS AND DIAPERS) IMPLANT
WATER STERILE IRR 1000ML POUR (IV SOLUTION) ×4 IMPLANT

## 2018-06-20 NOTE — H&P (Signed)
  Patient ID:   [id number] Patient: Barry Miranda  Date of Birth: Jun 09, 1961 Visit Type: Office Visit   Date: 05/15/2018 04:15 PM Provider: Marchia Meiers. Vertell Limber MD   This 57 year old male presents for Head injury f/u surgery.  HISTORY OF PRESENT ILLNESS:  1.  Head injury f/u suergery  03/29/18 Decompressive right frontoparietal craniectomy  Patient, 57 y/o male, returns s/p decompressive right craniectomy. Patient wishes to discuss cranioplasty this visit.     Head CT demonstrates resolution of subdural hematoma.  No extra-axial mass or mass effect appreciated.      PAST MEDICAL/SURGICAL HISTORY:   (Detailed)       Family History:  (Detailed)   Social History:  (Detailed) Tobacco use reviewed. Preferred language is Unknown.   Tobacco use status: Current non-smoker. Smoking status: Never smoker.  SMOKING STATUS Type Smoking Status Usage Per Day Years Used Total Pack Years   Never smoker      TOBACCO/VAPING EXPOSURE No passive vaping exposure. No passive smoke exposure.   HOME ENVIRONMENT/SAFETY The patient is not at risk for falls. The patient has not fallen in the last year.     MEDICATIONS: (added, continued or stopped this visit)   ALLERGIES: Ingredient Reaction Medication Name Comment  NO KNOWN ALLERGIES     No known allergies.    PHYSICAL EXAM:   Vitals Date Temp F BP Pulse Ht In Wt Lb BMI BSA Pain Score  05/15/2018  120/73 59 64 145 24.89  1/10      IMPRESSION:   Right cranioplasty scheduled. Upon examination, no pronator drift. Released to light duty until right cranioplasty. Continue current medication regiment. Advised to continue to wear protective head gear.  PLAN:  1. Right Cranioplasty scheduled 2. Patient was advised to continue to wear protective head gear 3. Follow-up 2 weeks post op for staple removal   Assessment/Plan   # Detail Type Description   1. Assessment S/P craniotomy (Z98.890).       2. Assessment Subdural  hematoma (S06.5X9A).         Pain Management Plan Pain Scale: 1/10. Method: Numeric Pain Intensity Scale. Location: head. Onset: 04/14/2018. Duration: varies. Quality: discomforting. Pain management follow-up plan of care: Heat/ Ice for relief.  Fall Risk Plan The patient has not fallen in the last year.              Provider:  Marchia Meiers. Vertell Limber MD  05/15/2018 09:49 PM Dictation edited by: Mirian Mo    CC Providers: [address],  [place]  [phone]   Quincy Carnes  [address] [place], [place] [address]               Electronically signed by Marchia Meiers. Vertell Limber MD on 05/16/2018 06:51 PM

## 2018-06-20 NOTE — Progress Notes (Signed)
Patient ID: Barry Miranda, male   DOB: September 17, 1961, 57 y.o.   MRN: [id number] Awake, responds to questions, reporting right side scalp pain only. Drsg scalp and abdomen intact. MAEW. PEARL.

## 2018-06-20 NOTE — Anesthesia Postprocedure Evaluation (Signed)
Anesthesia Post Note  Patient: Barry Miranda  Procedure(s) Performed: Right CRANIOPLASTY with bone flap from abdomen (Right ) CRANIOTOMY BONE FLAP/PROSTHETIC PLATE (Right )     Patient location during evaluation: PACU Anesthesia Type: General Level of consciousness: awake and alert and oriented Pain management: pain level controlled Vital Signs Assessment: post-procedure vital signs reviewed and stable Respiratory status: spontaneous breathing, nonlabored ventilation, respiratory function stable and patient connected to nasal cannula oxygen Cardiovascular status: blood pressure returned to baseline and stable Postop Assessment: no apparent nausea or vomiting Anesthetic complications: no    Last Vitals:  Vitals:   06/20/18 1615 06/20/18 1620  BP: (!) 159/93   Pulse: 75 77  Resp: 11 19  Temp: 36.6 C   SpO2: 98% 99%    Last Pain:  Vitals:   06/20/18 1540  TempSrc:   PainSc: 4                  Jalei Shibley A.

## 2018-06-20 NOTE — Anesthesia Preprocedure Evaluation (Signed)
Anesthesia Evaluation  Patient identified by MRN, date of birth, ID band Patient awake    Reviewed: Allergy & Precautions, NPO status , Patient's Chart, lab work & pertinent test results  History of Anesthesia Complications Negative for: history of anesthetic complications  Airway Mallampati: II  TM Distance: >3 FB Neck ROM: Full    Dental no notable dental hx.    Pulmonary neg pulmonary ROS, former smoker,    Pulmonary exam normal        Cardiovascular negative cardio ROS Normal cardiovascular exam     Neuro/Psych Hx of subdural hematoma s/p crani in June 2019 negative psych ROS   GI/Hepatic negative GI ROS, Neg liver ROS,   Endo/Other  negative endocrine ROS  Renal/GU negative Renal ROS  negative genitourinary   Musculoskeletal negative musculoskeletal ROS (+)   Abdominal   Peds  Hematology negative hematology ROS (+)   Anesthesia Other Findings   Reproductive/Obstetrics                             Anesthesia Physical Anesthesia Plan  ASA: II  Anesthesia Plan: General   Post-op Pain Management:    Induction: Intravenous  PONV Risk Score and Plan: 2 and Ondansetron, Dexamethasone and Treatment may vary due to age or medical condition  Airway Management Planned: Oral ETT  Additional Equipment:   Intra-op Plan:   Post-operative Plan: Extubation in OR  Informed Consent: I have reviewed the patients History and Physical, chart, labs and discussed the procedure including the risks, benefits and alternatives for the proposed anesthesia with the patient or authorized representative who has indicated his/her understanding and acceptance.     Plan Discussed with:   Anesthesia Plan Comments:         Anesthesia Quick Evaluation

## 2018-06-20 NOTE — Interval H&P Note (Signed)
History and Physical Interval Note:  06/20/2018 12:51 PM  Barry Miranda  has presented today for surgery, with the diagnosis of Non traumatic subdural hemorrhage; s/p craniotomy  The various methods of treatment have been discussed with the patient and family. After consideration of risks, benefits and other options for treatment, the patient has consented to  Procedure(s) with comments: Right CRANIOPLASTY with bone flap from abdomen (Right) - Right Cranioplasty with bone flap from abdomen CRANIOTOMY BONE FLAP/PROSTHETIC PLATE (Right) as a surgical intervention .  The patient's history has been reviewed, patient examined, no change in status, stable for surgery.  I have reviewed the patient's chart and labs.  Questions were answered to the patient's satisfaction.     Dorthie Santini D

## 2018-06-20 NOTE — Transfer of Care (Signed)
Immediate Anesthesia Transfer of Care Note  Patient: Barry Miranda  Procedure(s) Performed: Right CRANIOPLASTY with bone flap from abdomen (Right ) CRANIOTOMY BONE FLAP/PROSTHETIC PLATE (Right )  Patient Location: PACU  Anesthesia Type:General  Level of Consciousness: awake, alert  and oriented  Airway & Oxygen Therapy: Patient Spontanous Breathing and Patient connected to face mask oxygen  Post-op Assessment: Report given to RN and Post -op Vital signs reviewed and stable  Post vital signs: Reviewed and stable  Last Vitals:  Vitals Value Taken Time  BP    Temp    Pulse    Resp    SpO2      Last Pain:  Vitals:   06/20/18 0946  TempSrc:   PainSc: 0-No pain      Patients Stated Pain Goal: 2 (86/76/72 0947)  Complications: No apparent anesthesia complications

## 2018-06-20 NOTE — Anesthesia Procedure Notes (Signed)
Procedure Name: Intubation Date/Time: 06/20/2018 12:41 PM Performed by: Neldon Newport, CRNA Pre-anesthesia Checklist: Timeout performed, Patient being monitored, Suction available, Emergency Drugs available and Patient identified Patient Re-evaluated:Patient Re-evaluated prior to induction Oxygen Delivery Method: Circle system utilized Preoxygenation: Pre-oxygenation with 100% oxygen Induction Type: IV induction Ventilation: Mask ventilation without difficulty Laryngoscope Size: Mac, 3 and Glidescope (Grade III using Mac 3. Anterior anatomy.  Grade 1 with Glidescope.) Grade View: Grade III Tube type: Oral Tube size: 7.5 mm Number of attempts: 2 Placement Confirmation: breath sounds checked- equal and bilateral,  positive ETCO2 and ETT inserted through vocal cords under direct vision Secured at: 22 cm Tube secured with: Tape Dental Injury: Teeth and Oropharynx as per pre-operative assessment

## 2018-06-20 NOTE — Op Note (Signed)
06/20/2018  2:36 PM  PATIENT:  Barry Miranda  57 y.o. male  PRE-OPERATIVE DIAGNOSIS:  Non-traumatic subdural hemorrhage; status post craniotomy with skull defect  POST-OPERATIVE DIAGNOSIS:  Non-traumatic subdural hemorrhage; status post craniotomy with skull defect  PROCEDURE:  Procedure(s) with comments: Right CRANIOPLASTY with bone flap from abdomen (Right) - Right Cranioplasty with bone flap from abdomen CRANIOTOMY BONE FLAP/PROSTHETIC PLATE (Right) - CRANIOTOMY BONE FLAP/PROSTHETIC PLATE  SURGEON:  Surgeon(s) and Role:    Erline Levine, MD - Primary  PHYSICIAN ASSISTANT:   ASSISTANTS: Poteat, RN   ANESTHESIA:   general  [id number]  BLOOD ADMINISTERED:none  DRAINS: (10) Jackson-Pratt drain(s) with closed bulb suction in the subgaleal space   LOCAL MEDICATIONS USED:  LIDOCAINE   SPECIMEN:  No Specimen  DISPOSITION OF SPECIMEN:  N/A  COUNTS:  YES  TOURNIQUET:  * No tourniquets in log *  DICTATION: DICTATION: Patient is 57 year old man who had a prior craniotomy fora subural and developed multiple recurrent subdurals which resulted in the bone flap being removed. The bone flap was placed in the abdomen.  It was elected to take him to surgery for cranioplasty of skull defect with retrieval of bone flap from abdomen to cover the defect.  Procedure:  Patient was intubated and was placed in semi-lateral position with blanket roll and head was placed on donut head holder and left frontal scalp was shaved and prepped and draped in usual sterile fashion.  Area of planned incision was infiltrated with lidocaine. A curvilinear incision was made and carried through temporalis fascia and muscle to expose calvarium and margins of prior craniectomy.  Dural covering remaining intact and the brain was pulsatile.  The previous abdominal incision was reopened and the bone flap was retrieved.  The bone flap was placed over the skull defect and anchored with 10 screws. A # 10 JP drain was  placed through a separate stab incision. The galea was closed with 2-0 vicryl stitches and the skin was re approximated with staples. The abdominal incision was closed with 2-0 and 3-0 Vicryl stitches and then Dermabond and an occlusive dressing.  Sterile occlusive dressings were placed.  Patient was extubated in the OR and taken to Recovery in stable and satisfactory condition having tolerated his surgery well. Counts were correct at the end of the case.   PLAN OF CARE: Admit to inpatient   PATIENT DISPOSITION:  PACU - hemodynamically stable.   Delay start of Pharmacological VTE agent (>24hrs) due to surgical blood loss or risk of bleeding: yes

## 2018-06-20 NOTE — Anesthesia Procedure Notes (Signed)
Arterial Line Insertion Start/End9/09/2018 10:30 AM, 06/20/2018 10:48 AM Performed by: Josephine Igo, CRNA, CRNA  Patient location: Pre-op. Preanesthetic checklist: patient identified, IV checked, surgical consent, monitors and equipment checked and pre-op evaluation Lidocaine 1% used for infiltration Left, radial was placed Catheter size: 20 G Hand hygiene performed , maximum sterile barriers used  and Seldinger technique used  Attempts: 1 Procedure performed without using ultrasound guided technique. Following insertion, dressing applied and Biopatch. Post procedure assessment: normal

## 2018-06-20 NOTE — Brief Op Note (Signed)
06/20/2018  2:36 PM  PATIENT:  Barry Miranda  57 y.o. male  PRE-OPERATIVE DIAGNOSIS:  Non-traumatic subdural hemorrhage; status post craniotomy with skull defect  POST-OPERATIVE DIAGNOSIS:  Non-traumatic subdural hemorrhage; status post craniotomy with skull defect  PROCEDURE:  Procedure(s) with comments: Right CRANIOPLASTY with bone flap from abdomen (Right) - Right Cranioplasty with bone flap from abdomen CRANIOTOMY BONE FLAP/PROSTHETIC PLATE (Right) - CRANIOTOMY BONE FLAP/PROSTHETIC PLATE  SURGEON:  Surgeon(s) and Role:    Erline Levine, MD - Primary  PHYSICIAN ASSISTANT:   ASSISTANTS: Poteat, RN   ANESTHESIA:   general  ZWC:HENIDPO  BLOOD ADMINISTERED:none  DRAINS: (10) Jackson-Pratt drain(s) with closed bulb suction in the subgaleal space   LOCAL MEDICATIONS USED:  LIDOCAINE   SPECIMEN:  No Specimen  DISPOSITION OF SPECIMEN:  N/A  COUNTS:  YES  TOURNIQUET:  * No tourniquets in log *  DICTATION: DICTATION: Patient is 57 year old man who had a prior craniotomy fora subural and developed multiple recurrent subdurals which resulted in the bone flap being removed. The bone flap was placed in the abdomen.  It was elected to take him to surgery for cranioplasty of skull defect with retrieval of bone flap from abdomen to cover the defect.  Procedure:  Patient was intubated and was placed in semi-lateral position with blanket roll and head was placed on donut head holder and left frontal scalp was shaved and prepped and draped in usual sterile fashion.  Area of planned incision was infiltrated with lidocaine. A curvilinear incision was made and carried through temporalis fascia and muscle to expose calvarium and margins of prior craniectomy.  Dural covering remaining intact and the brain was pulsatile.  The previous abdominal incision was reopened and the bone flap was retrieved.  The bone flap was placed over the skull defect and anchored with 10 screws. A # 10 JP drain was  placed through a separate stab incision. The galea was closed with 2-0 vicryl stitches and the skin was re approximated with staples. The abdominal incision was closed with 2-0 and 3-0 Vicryl stitches and then Dermabond and an occlusive dressing.  Sterile occlusive dressings were placed.  Patient was extubated in the OR and taken to Recovery in stable and satisfactory condition having tolerated his surgery well. Counts were correct at the end of the case.   PLAN OF CARE: Admit to inpatient   PATIENT DISPOSITION:  PACU - hemodynamically stable.   Delay start of Pharmacological VTE agent (>24hrs) due to surgical blood loss or risk of bleeding: yes

## 2018-06-20 NOTE — Progress Notes (Signed)
Margo Aye, NP aware pt bleeding from cranioplasty site. Okay to change out dressing and wrap w/ gauze. Will continue to monitor site.

## 2018-06-21 ENCOUNTER — Encounter (HOSPITAL_COMMUNITY): Payer: Self-pay | Admitting: Neurosurgery

## 2018-06-21 NOTE — Progress Notes (Signed)
Patient ID: Barry Miranda, male   DOB: [birth year], 58 y.o.   MRN: [id number] Drain pulled on Dr. Melven Sartorius order. Staple to drain site, staple to bleeding site on incision. Bleeding stopped at present. Pressure drsg reapplied.  Ok to mobilize. Expect d/c to home this afternoon if bleeding is stopped.

## 2018-06-21 NOTE — Discharge Summary (Signed)
Physician Discharge Summary  Patient ID: Barry Miranda MRN: [id number] DOB/AGE: 04-07-1961 57 y.o.  Admit date: 06/20/2018 Discharge date: 06/21/2018  Admission Diagnoses:Non-traumatic subdural hemorrhage; status post craniotomy with skull defect     Discharge Diagnoses: Non-traumatic subdural hemorrhage; status post craniotomy with skull defect s/p Right CRANIOPLASTY with bone flap from abdomen (Right) - Right Cranioplasty with bone flap from abdomen CRANIOTOMY BONE FLAP/PROSTHETIC PLATE (Right) - CRANIOTOMY BONE FLAP/PROSTHETIC PLATE       Active Problems:   Status post craniotomy   Skull defect   Discharged Condition: good  Hospital Course: Barry Miranda was admitted for cranioplasty with replacement of bone flap from right abdomen. Following uncomplicated surgery, he recovered nicely and transferred to Neuro ICU for nursing care. He has mobilized nicely. On 06-22-2018, he was ambulated well, tolerating a regular diet, and discharged home. He will follow up with Dr. Vertell Limber in clinic for a post-op check.   Consults: None  Significant Diagnostic Studies:   Treatments: surgery: Right CRANIOPLASTY with bone flap from abdomen (Right) - Right Cranioplasty with bone flap from abdomen CRANIOTOMY BONE FLAP/PROSTHETIC PLATE (Right) - CRANIOTOMY BONE FLAP/PROSTHETIC PLATE     Discharge Exam: Blood pressure 135/81, pulse 79, temperature 97.6 F (36.4 C), temperature source Oral, resp. rate 10, height 5\' 4"  (1.626 m), weight 70.8 kg, SpO2 99 %. Alert, conversant. Wife present and attentive. PEARL. MAEW. Denies headache, reporting only mild right side scalp pain. No abdominal discomfort. Right abdomen incision without erythema, swelling, or drainage beneath Dermabond. Scalp incision well approximated.      Disposition: Discharge to home. Pt has Norco 5/325 at home for prn use. Office f/u in 2 weeks for staple removal.   Signed: Verdis Prime 06/21/2018, 8:07 AM

## 2018-06-21 NOTE — Progress Notes (Addendum)
Subjective: Patient reports "I'm ok. It just keeps bleeding"  Objective: Vital signs in last 24 hours: Temp:  [97.6 F (36.4 C)-97.9 F (36.6 C)] 97.6 F (36.4 C) (09/13 0400) Pulse Rate:  [71-88] 79 (09/13 0700) Resp:  [8-19] 10 (09/13 0700) BP: (117-183)/(73-103) 135/81 (09/13 0700) SpO2:  [93 %-100 %] 99 % (09/13 0700) Arterial Line BP: (150-222)/(69-99) 174/76 (09/13 0700) Weight:  [70.8 kg] 70.8 kg (09/12 1645)  Intake/Output from previous day: 09/12 0701 - 09/13 0700 In: 2183.6 [P.O.:240; I.V.:1593.7; IV Piggyback:349.9] Out: 1955 [Urine:1850; Drains:65; Blood:40] Intake/Output this shift: No intake/output data recorded.  Alert, conversant. Wife present and attentive. PEARL. MAEW. Denies headache, reporting only mild right side scalp pain. No abdominal discomfort. Right abdomen incision without erythema, swelling, or drainage beneath Dermabond. Scalp incision well approximated, with persistent slow bleeding at ear, requiring frequent drsg changes overnight. Drain patent 80ml since 2300.   Lab Results: No results for input(s): WBC, HGB, HCT, PLT in the last 72 hours. BMET Recent Labs    06/20/18 0948  NA 140  K 4.1  CL 108  CO2 22  GLUCOSE 91  BUN 11  CREATININE 0.84  CALCIUM 9.2    Studies/Results: No results found.  Assessment/Plan:   LOS: 1 day  Scalp dressing removed for incision assessment, reapplied with pressure to draining area above right ear. Will d/c arterial line and mobilize.    Verdis Prime 06/21/2018, 7:26 AM  Will observe patient today, likely discharge in AM.

## 2018-06-21 NOTE — Evaluation (Signed)
Physical Therapy Evaluation Patient Details Name: Barry Miranda MRN: [id number] DOB: [birth year] Today's Date: 06/21/2018   History of Present Illness  pt is a 57 y/o male admitted for cranioplasty with replacement of bone flap removed in craniotomy during non traumatic SDH.  PMH: recent SDH.  Clinical Impression  Pt is at or close to baseline functioning and should be safe at home . There are no further acute PT needs.  Will sign off at this time.     Follow Up Recommendations No PT follow up    Equipment Recommendations  None recommended by PT    Recommendations for Other Services       Precautions / Restrictions        Mobility  Bed Mobility Overal bed mobility: Independent                Transfers Overall transfer level: Independent                  Ambulation/Gait Ambulation/Gait assistance: Independent Gait Distance (Feet): 600 Feet   Gait Pattern/deviations: WFL(Within Functional Limits)   Gait velocity interpretation: >4.37 ft/sec, indicative of normal walking speed General Gait Details: pt able to maintain age appropriate gait speed, scan his environment and turn abruptly without deviation.  Stairs Stairs: Yes Stairs assistance: Independent Stair Management: No rails;Alternating pattern;Forwards Number of Stairs: 3 General stair comments: steady and safe on stairs  Wheelchair Mobility    Modified Rankin (Stroke Patients Only)       Balance Overall balance assessment: No apparent balance deficits (not formally assessed)                                           Pertinent Vitals/Pain Pain Assessment: Faces Faces Pain Scale: Hurts a little bit Pain Location: head Pain Descriptors / Indicators: Discomfort Pain Intervention(s): Monitored during session    Home Living Family/patient expects to be discharged to:: Private residence Living Arrangements: Spouse/significant other;Children Available Help at Discharge:  Family;Available 24 hours/day Type of Home: House Home Access: Level entry     Home Layout: Two level;Bed/bath upstairs Home Equipment: None      Prior Function Level of Independence: Independent         Comments: reading      Hand Dominance   Dominant Hand: Right    Extremity/Trunk Assessment   Upper Extremity Assessment Upper Extremity Assessment: Overall WFL for tasks assessed    Lower Extremity Assessment Lower Extremity Assessment: Overall WFL for tasks assessed       Communication   Communication: No difficulties  Cognition Arousal/Alertness: Awake/alert Behavior During Therapy: WFL for tasks assessed/performed Overall Cognitive Status: Within Functional Limits for tasks assessed                                        General Comments      Exercises     Assessment/Plan    PT Assessment Patent does not need any further PT services  PT Problem List         PT Treatment Interventions      PT Goals (Current goals can be found in the Care Plan section)  Acute Rehab PT Goals PT Goal Formulation: All assessment and education complete, DC therapy    Frequency     Barriers to  discharge        Co-evaluation               AM-PAC PT "6 Clicks" Daily Activity  Outcome Measure Difficulty turning over in bed (including adjusting bedclothes, sheets and blankets)?: None Difficulty moving from lying on back to sitting on the side of the bed? : None Difficulty sitting down on and standing up from a chair with arms (e.g., wheelchair, bedside commode, etc,.)?: None Help needed moving to and from a bed to chair (including a wheelchair)?: None Help needed walking in hospital room?: None Help needed climbing 3-5 steps with a railing? : None 6 Click Score: 24    End of Session   Activity Tolerance: Patient tolerated treatment well Patient left: in chair;with call bell/phone within reach Nurse Communication: Mobility status PT Visit  Diagnosis: Unsteadiness on feet (R26.81)    Time: 4585-9292 PT Time Calculation (min) (ACUTE ONLY): 18 min   Charges:   PT Evaluation $PT Eval Low Complexity: 1 Low          06/21/2018  Donnella Sham, PT Acute Rehabilitation Services [phone]  (pager) [phone]  (office)  Tessie Fass Ashtian Villacis 06/21/2018, 4:12 PM

## 2018-06-21 NOTE — Plan of Care (Signed)
Pt eats 100% of meal tray, excellent appetite. Pt pain controlled by PRN Norco. Able to rest comfortably throughout the day.

## 2018-06-22 NOTE — Progress Notes (Signed)
Neurosurgery Service Progress Note  Subjective: No acute events overnight, feeling well today, minimal headache, minimal abdominal pain around the incision, tolerating diet and fluid intake well, not requiring pain medications, no new complaints.   Objective: Vitals:   06/22/18 0000 06/22/18 0100 06/22/18 0200 06/22/18 0817  BP: (!) 147/85 (!) 149/84 140/86 136/81  Pulse: 75 82 (!) 101 78  Resp: 13 11 20 12   Temp:   98.6 F (37 C) 98.4 F (36.9 C)  TempSrc:   Oral Oral  SpO2: 98% 97% 99% 100%  Weight:      Height:       Temp (24hrs), Avg:98.3 F (36.8 C), Min:98 F (36.7 C), Max:98.6 F (37 C)  CBC Latest Ref Rng & Units 06/14/2018 04/12/2018 04/08/2018  WBC 4.0 - 10.5 K/uL 6.1 5.9 8.9  Hemoglobin 13.0 - 17.0 g/dL 15.9 10.4(L) 10.2(L)  Hematocrit 39.0 - 52.0 % 47.9 32.9(L) 31.7(L)  Platelets 150 - 400 K/uL 235 403(H) 391   BMP Latest Ref Rng & Units 06/20/2018 04/12/2018 04/08/2018  Glucose 70 - 99 mg/dL 91 113(H) 108(H)  BUN 6 - 20 mg/dL 11 18 16   Creatinine 0.61 - 1.24 mg/dL 0.84 0.72 0.74  Sodium 135 - 145 mmol/L 140 140 138  Potassium 3.5 - 5.1 mmol/L 4.1 3.7 4.4  Chloride 98 - 111 mmol/L 108 108 105  CO2 22 - 32 mmol/L 22 25 26   Calcium 8.9 - 10.3 mg/dL 9.2 8.4(L) 8.6(L)    Intake/Output Summary (Last 24 hours) at 06/22/2018 1011 Last data filed at 06/22/2018 0200 Gross per 24 hour  Intake 538.59 ml  Output 850 ml  Net -311.41 ml    Current Facility-Administered Medications:  .  0.9 %  sodium chloride infusion, , Intravenous, PRN, Meyran, Ocie Cornfield, NP, Last Rate: 10 mL/hr at 06/22/18 0606, 250 mL at 06/22/18 0606 .  0.9 % NaCl with KCl 20 mEq/ L  infusion, , Intravenous, Continuous, Erline Levine, MD, Last Rate: 75 mL/hr at 06/22/18 0605 .  acetaminophen (TYLENOL) tablet 650 mg, 650 mg, Oral, Q4H PRN **OR** acetaminophen (TYLENOL) suppository 650 mg, 650 mg, Rectal, Q4H PRN, Erline Levine, MD .  bisacodyl (DULCOLAX) suppository 10 mg, 10 mg, Rectal, Daily PRN,  Erline Levine, MD .  docusate sodium (COLACE) capsule 100 mg, 100 mg, Oral, BID, Erline Levine, MD, 100 mg at 06/21/18 2212 .  famotidine (PEPCID) IVPB 20 mg premix, 20 mg, Intravenous, Q12H, Erline Levine, MD, Stopped at 06/21/18 2252 .  HYDROcodone-acetaminophen (NORCO/VICODIN) 5-325 MG per tablet 1 tablet, 1 tablet, Oral, Q4H PRN, Erline Levine, MD, 1 tablet at 06/22/18 0215 .  labetalol (NORMODYNE,TRANDATE) injection 10-40 mg, 10-40 mg, Intravenous, Q10 min PRN, Erline Levine, MD, 40 mg at 06/20/18 1841 .  levETIRAcetam (KEPPRA) IVPB 500 mg/100 mL premix, 500 mg, Intravenous, Q12H, Erline Levine, MD, Last Rate: 400 mL/hr at 06/22/18 0607, 500 mg at 06/22/18 0607 .  morphine 2 MG/ML injection 2 mg, 2 mg, Intravenous, Q2H PRN, Erline Levine, MD, 2 mg at 06/20/18 1715 .  ondansetron (ZOFRAN) tablet 4 mg, 4 mg, Oral, Q4H PRN **OR** ondansetron (ZOFRAN) injection 4 mg, 4 mg, Intravenous, Q4H PRN, Erline Levine, MD .  polyethylene glycol (MIRALAX / GLYCOLAX) packet 17 g, 17 g, Oral, Daily PRN, Erline Levine, MD .  promethazine (PHENERGAN) tablet 12.5-25 mg, 12.5-25 mg, Oral, Q4H PRN, Erline Levine, MD .  senna-docusate (Senokot-S) tablet 1 tablet, 1 tablet, Oral, QHS PRN, Erline Levine, MD .  simvastatin (ZOCOR) tablet 20 mg, 20 mg, Oral,  Graciela Husbands, MD, 20 mg at 06/21/18 2212 .  topiramate (TOPAMAX) tablet 50 mg, 50 mg, Oral, QHS, Erline Levine, MD, 50 mg at 06/21/18 2212   Physical Exam: AOx3, PERRL, EOMI, MAEx4 Head wrap dc'd  Assessment & Plan: 57 y.o. man s/p cranioplasty, recovering well. -discharge home this morning  Judith Part  05/16/18 5:08 PM

## 2018-06-22 NOTE — Progress Notes (Signed)
Pt transferred from 4N15 to 4P05 after report.  Pt moved to have bed available, okay to tx per Dr. Zada Finders, [facility].  Expected d/c in AM.

## 2018-06-22 NOTE — Discharge Instructions (Signed)
Discharge Instructions  No restriction in activities, slowly increase your activity back to normal.   Okay to shower on the day of discharge. Be gentle when cleaning your incision. Use regular soap and water. If that is uncomfortable, try using baby shampoo. Do not submerge the wound under water for 2 weeks after surgery.  If you do not have the information for your post-operative visit with Dr. Vertell Limber, please call his office to find out the date and time that he scheduled to see you in the office. If you have any concerns or questions, please call Dr. Melven Sartorius office.

## 2018-06-24 ENCOUNTER — Encounter: Payer: 59 | Attending: Registered Nurse | Admitting: Physical Medicine & Rehabilitation

## 2018-06-24 ENCOUNTER — Encounter: Payer: Self-pay | Admitting: Physical Medicine & Rehabilitation

## 2018-06-24 VITALS — BP 127/79 | HR 79 | Ht 64.0 in | Wt 150.8 lb

## 2018-06-24 DIAGNOSIS — G479 Sleep disorder, unspecified: Secondary | ICD-10-CM | POA: Insufficient documentation

## 2018-06-24 DIAGNOSIS — E785 Hyperlipidemia, unspecified: Secondary | ICD-10-CM | POA: Insufficient documentation

## 2018-06-24 DIAGNOSIS — G441 Vascular headache, not elsewhere classified: Secondary | ICD-10-CM

## 2018-06-24 DIAGNOSIS — F17210 Nicotine dependence, cigarettes, uncomplicated: Secondary | ICD-10-CM | POA: Insufficient documentation

## 2018-06-24 DIAGNOSIS — S065X9A Traumatic subdural hemorrhage with loss of consciousness of unspecified duration, initial encounter: Secondary | ICD-10-CM | POA: Diagnosis not present

## 2018-06-24 DIAGNOSIS — S065XAA Traumatic subdural hemorrhage with loss of consciousness status unknown, initial encounter: Secondary | ICD-10-CM

## 2018-06-24 DIAGNOSIS — Z9889 Other specified postprocedural states: Secondary | ICD-10-CM | POA: Diagnosis not present

## 2018-06-24 NOTE — Progress Notes (Signed)
Subjective:    Patient ID: Barry Miranda, male    DOB: [birth year], 57 y.o.   MRN: [id number]  HPI   Mr. Schiff is here in follow up his right SDH. He has his cranioplasty on Thursday. He has a mild headache at times since the procedure but the pain has been minimal.  He follows up with neurosurgery at the end of this month.  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  He states that he does not have to lift more than 10 or 15 pounds at the time.  He has an Environmental consultant to help some move objects and lift things if needed as well.  From a medication standpoint he remains on Topamax 50 mg at night as well as Keppra 1000 mg twice daily.  He has Tylenol and Fioricet for breakthrough headaches.  Cognition is back to baseline.  Mobility is near normal as well.  Mood has been good.  He is has been sleeping well.  Pain Inventory Average Pain 3 Pain Right Now 4 My pain is aching  In the last 24 hours, has pain interfered with the following? General activity 0 Relation with others 0 Enjoyment of life 0 What TIME of day is your pain at its worst? evening Sleep (in general) Poor  Pain is worse with: sitting Pain improves with: medication Relief from Meds: .  Mobility walk without assistance ability to climb steps?  yes do you drive?  yes  Function employed # of hrs/week 40  Neuro/Psych No problems in this area  Prior Studies Any changes since last visit?  no  Physicians involved in your care Any changes since last visit?  no   Family History  Problem Relation Age of Onset  . Hypertension Mother   . Diabetes Mother    Social History   Socioeconomic History  . Marital status: Married    Spouse name: Not on file  . Number of children: Not on file  . Years of education: Not on file  . Highest education level: Not on file  Occupational History  . Not on file  Social Needs  . Financial resource strain: Not on file  . Food insecurity:    Worry: Not on file    Inability: Not on file  . Transportation needs:    Medical: Not on file    Non-medical: Not on file  Tobacco Use  . Smoking status: Former Smoker    Packs/day: 0.15    Last attempt to quit: 03/09/2018    Years since quitting: 0.2  . Smokeless tobacco: Never Used  Substance and Sexual Activity  . Alcohol use: Yes    Alcohol/week: 7.0 standard drinks    Types: 7 Cans of beer per week  . Drug use: No  . Sexual activity: Yes  Lifestyle  . Physical activity:    Days per week: Not on file    Minutes per session: Not on file  . Stress: Not on file  Relationships  . Social connections:    Talks on phone: Not on file    Gets together: Not on file    Attends religious service: Not on file    Active member of club or organization: Not on file    Attends meetings of clubs or organizations: Not on file    Relationship status: Not on file  Other Topics Concern  . Not on file  Social History Narrative  . Not on file   Past  Surgical History:  Procedure Laterality Date  . CHOLECYSTECTOMY  2012  . CRANIECTOMY FOR DEPRESSED SKULL FRACTURE Right 03/28/2018   Procedure: CRANIECTOMY for cerebral edema with Placement of Skull Flap in Abdomen;  Surgeon: Consuella Lose, MD;  Location: [place];  Service: Neurosurgery;  Laterality: Right;  . CRANIOPLASTY Right 06/20/2018   Procedure: Right CRANIOPLASTY with bone flap from abdomen;  Surgeon: Erline Levine, MD;  Location: [place];  Service: Neurosurgery;  Laterality: Right;  Right Cranioplasty with bone flap from abdomen  . CRANIOTOMY Right 03/24/2018   Procedure: CRANIOTOMY, HEMATOMA EVACUATION SUBDURAL, RIGHT SIDE;  Surgeon: Erline Levine, MD;  Location: [place];  Service: Neurosurgery;  Laterality: Right;  . CRANIOTOMY Right 03/24/2018   Procedure: CRANIOTOMY HEMATOMA EVACUATION EPIDURAL;  Surgeon: Kristeen Miss, MD;  Location: [place];  Service: Neurosurgery;  Laterality: Right;  . CRANIOTOMY Right 06/20/2018   Procedure: CRANIOTOMY BONE FLAP/PROSTHETIC  PLATE;  Surgeon: Erline Levine, MD;  Location: [place];  Service: Neurosurgery;  Laterality: Right;  CRANIOTOMY BONE FLAP/PROSTHETIC PLATE  . LIPOMA EXCISION     lipoma on forehead   . WISDOM TOOTH EXTRACTION     Past Medical History:  Diagnosis Date  . Hyperlipidemia    There were no vitals taken for this visit.  Opioid Risk Score:   Fall Risk Score:  `1  Depression screen PHQ 2/9  Depression screen [facility] 2/9 05/27/2018 04/24/2018  Decreased Interest 0 0  Down, Depressed, Hopeless 0 0  PHQ - 2 Score 0 0  Altered sleeping - 2  Tired, decreased energy - 1  Change in appetite - 2  Feeling bad or failure about yourself  - 0  Trouble concentrating - 0  Moving slowly or fidgety/restless - 0  Suicidal thoughts - 0  PHQ-9 Score - 5  Difficult doing work/chores - Not difficult at all     Review of Systems  Constitutional: Negative.   HENT: Negative.   Eyes: Negative.   Respiratory: Negative.   Cardiovascular: Negative.   Gastrointestinal: Negative.   Endocrine: Negative.   Genitourinary: Negative.   Musculoskeletal: Positive for arthralgias and myalgias.  Skin: Negative.   Allergic/Immunologic: Negative.   Neurological: Negative.   Hematological: Negative.   Psychiatric/Behavioral: Negative.   All other systems reviewed and are negative.      Objective:   Physical Exam General: No acute distress HEENT: EOMI, oral membranes moist, flap with staples, dry/intact Cards: reg rate  Chest: normal effort Abdomen: Soft, NT, ND Skin: dry, intact Extremities: no edema   Neuro: Pt is cognitively appropriate with normal insight, memory, and awareness. Cranial nerves 2-12 are intact. Sensory exam is normal. Reflexes are 2+ in all 4's. Fine motor coordination is intact. No tremors. Motor function is grossly 5/5. Normal cognition. Balance intact Musculoskeletal: Full ROM, No pain with AROM or PROM in the neck, trunk, or extremities. Posture appropriate Psych: pleasant and  cooperative.         Assessment & Plan:  1.Decreased functional mobility with apraxiasecondary to right SDH/EDHwith initial craniotomy evacuation hematoma 01/08/271 complicated by epidural hematoma with evacuation 03/25/2018 and follow-up decompressive right frontal parietal craniectomy implantation of bone flap 03/28/2018.  --follow up with NS re: staple removal at the end of the month  -recommend that he remain out of work until 10/7 and then he can resume at part time level for a week and advance to full time as tolerated thereafter  -I wrote a letter for his employer today.  Employer should contact me if any  other questions are had. 2.   Pain Management:continue topamax at 25mg  for one week then off.  3. .Seizure prophylaxis. Keppra 1000 mg every 12 hours   -Continue Keppra per neurosurgery recommendations   15 minutes of face to face patient care time were spent during this visit. All questions were encouraged and answered.  Follow up in about 3 months

## 2018-06-24 NOTE — Patient Instructions (Addendum)
PLEASE FEEL FREE TO CALL OUR OFFICE WITH ANY PROBLEMS OR QUESTIONS ([phone])   REDUCE TOPAMAX TO 25MG  AT NIGHT FOR ONE WEEK THEN STOP. IF HEADACHES REAPPEAR, THEN RESUME THE 25MG  DOSE.

## 2018-09-19 DIAGNOSIS — Z0000 Encounter for general adult medical examination without abnormal findings: Secondary | ICD-10-CM | POA: Diagnosis not present

## 2018-09-23 ENCOUNTER — Encounter: Payer: 59 | Admitting: Physical Medicine & Rehabilitation

## 2018-10-07 DIAGNOSIS — S065X9A Traumatic subdural hemorrhage with loss of consciousness of unspecified duration, initial encounter: Secondary | ICD-10-CM | POA: Diagnosis not present

## 2018-10-07 DIAGNOSIS — Z9889 Other specified postprocedural states: Secondary | ICD-10-CM | POA: Diagnosis not present

## 2019-08-24 IMAGING — CT CT HEAD W/O CM
3 of 4 series · 13 of 47 positions shown, 15 images · non-contrast
Comparison: 03/24/2018 CT head.

CLINICAL DATA: 56 y/o  M; change in mental status, lethargy.

EXAM:
CT HEAD WITHOUT CONTRAST
TECHNIQUE: Contiguous axial images were obtained from the base of the skull
through the vertex without intravenous contrast.

[Series 3: head wo · axial · 0.44mm/px · z∈[-184,-64]mm · 7 of 32 slices shown, 9 images]
[im 4/32  brain]
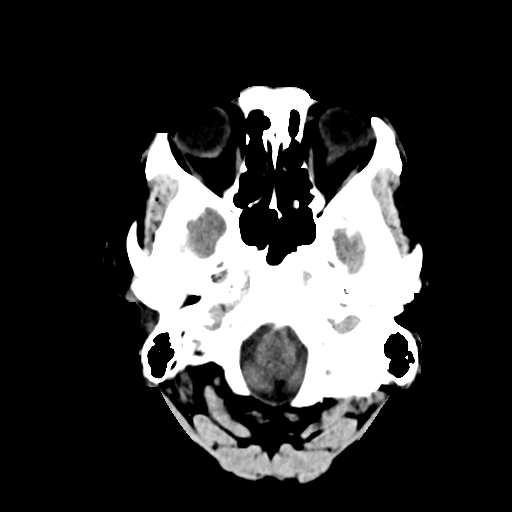
[im 4/32  bone]
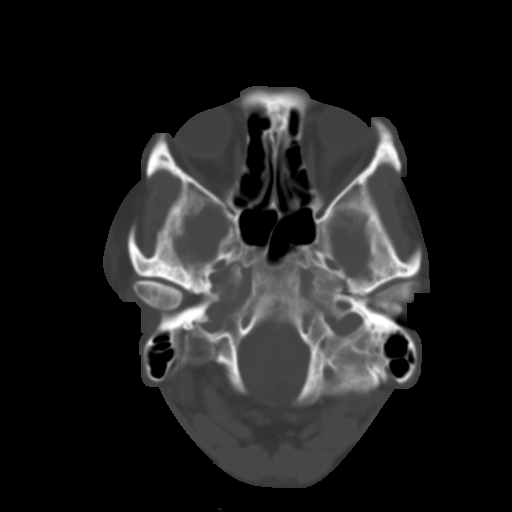
[im 8/32  brain]
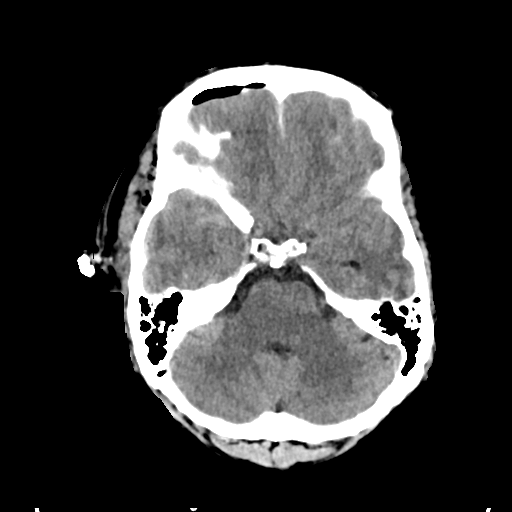
[im 12/32  brain]
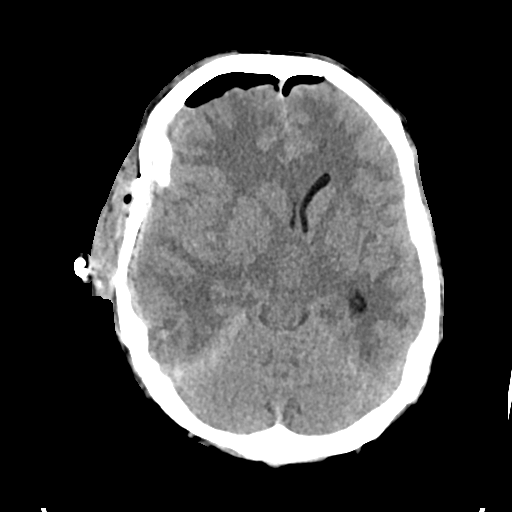
[im 16/32  brain]
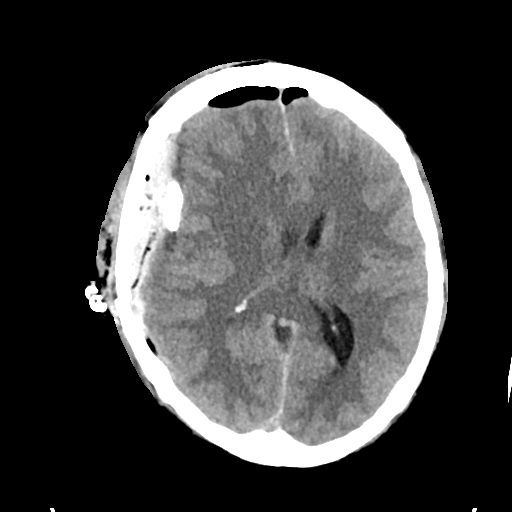
[im 20/32  brain]
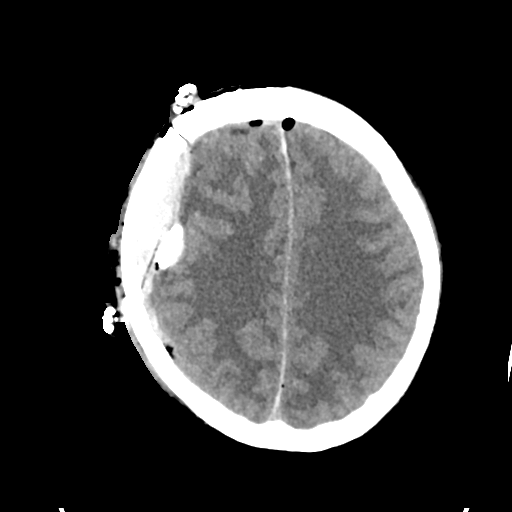
[im 20/32  bone]
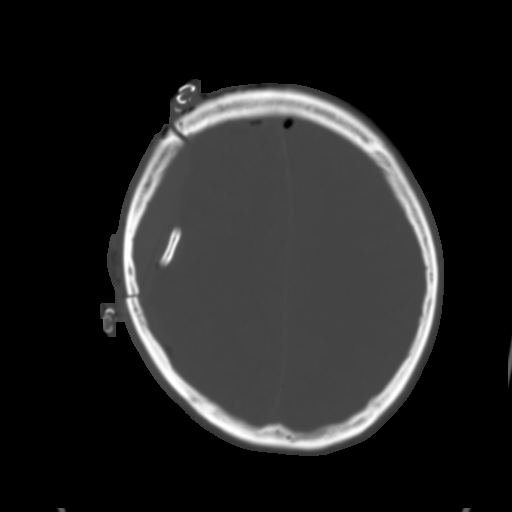
[im 24/32  brain]
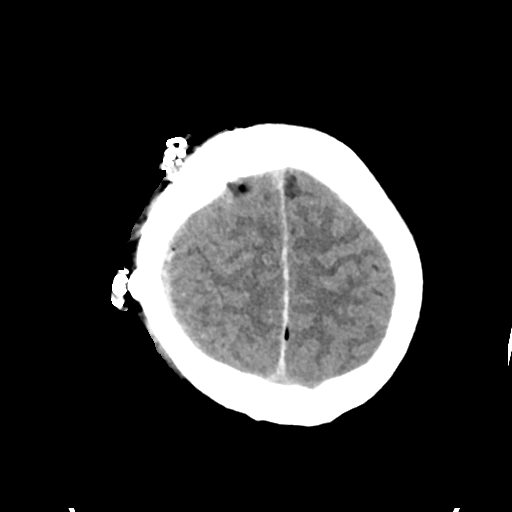
[im 28/32  brain]
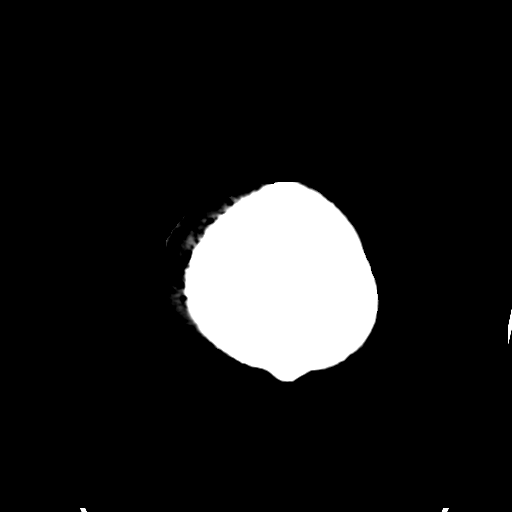

[Series 5: cor soft · coronal · 0.32mm/px · 3 of 61 slices shown]
[im 21/61  brain]
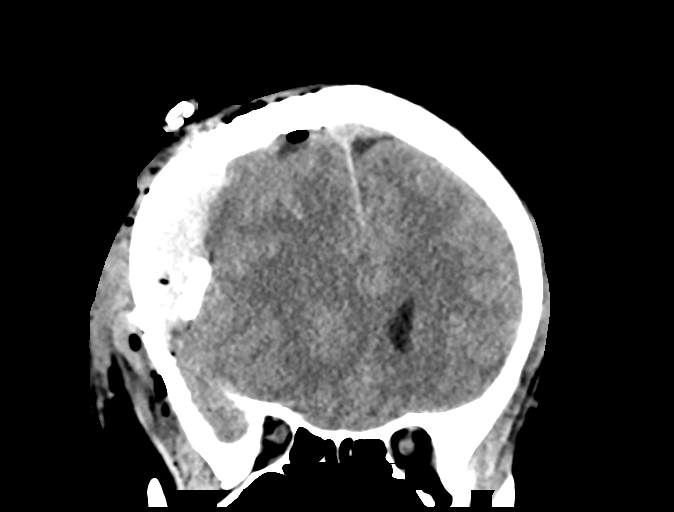
[im 27/61  brain]
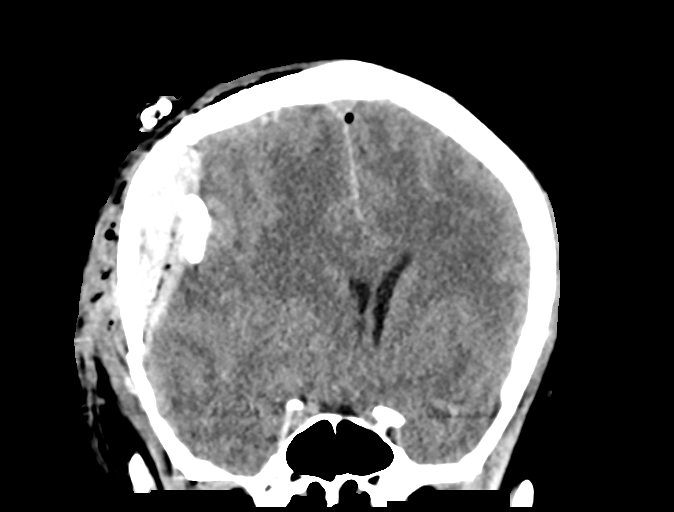
[im 34/61  brain]
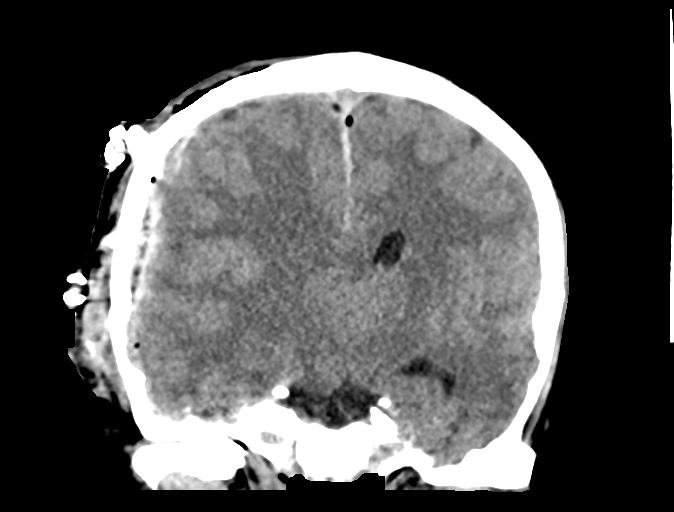

[Series 6: sag soft · sagittal · 0.31mm/px · 3 of 53 slices shown]
[im 18/53  brain]
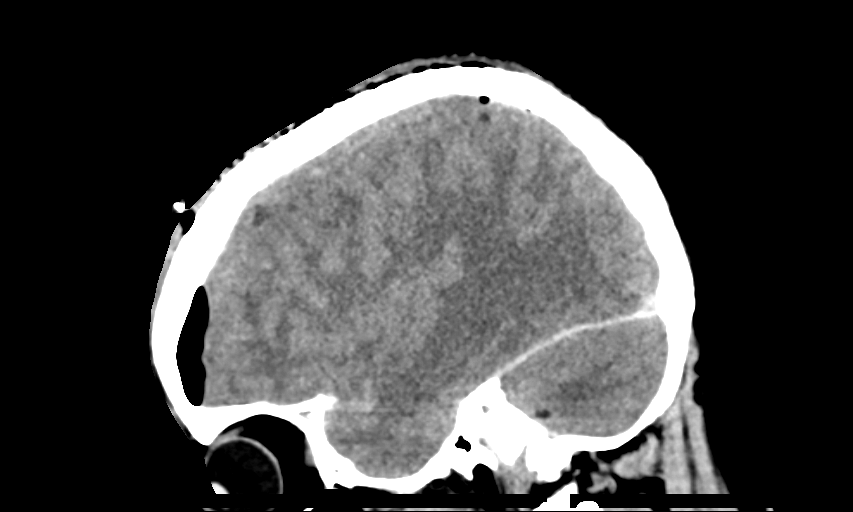
[im 27/53  brain]
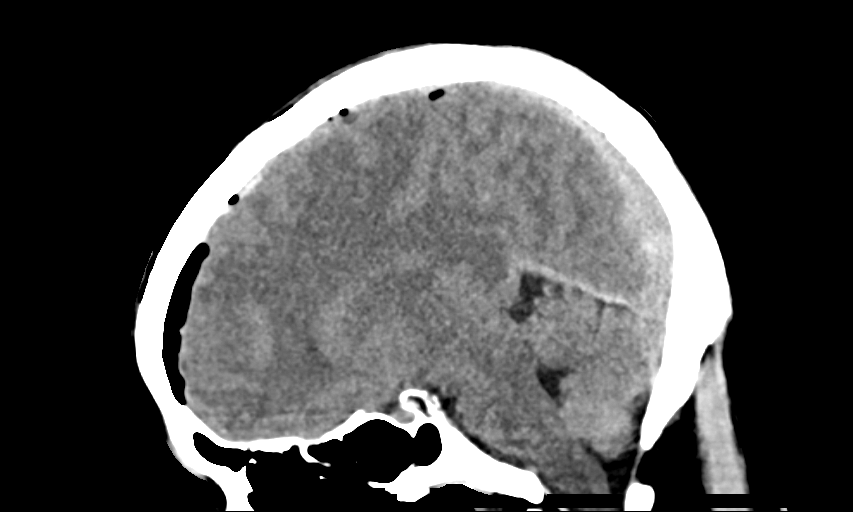
[im 35/53  brain]
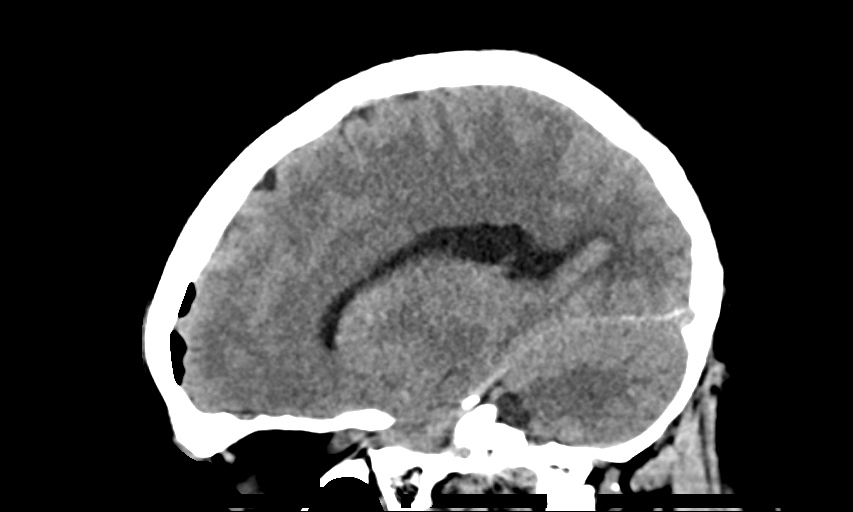

[13 of 47 positions shown; findings below may reference images not displayed]

FINDINGS: Brain: Interval right lateral craniotomy and placement of
extra-axial drain. The acute extra-axial collection over the right
cerebral convexity is now centered predominantly directly subjacent
to the craniotomy flap with increased thickness to 15 mm, but
decreased over the right apex and posterior convexity. Additionally,
there is a small volume of intracranial pneumocephalus.

Mass effect associated with the extra-axial collection partially
effaces the right lateral ventricle and results to 10 mm of
right-to-left midline shift with stable mild left ventricular
entrapment. Additionally, there is partial effacement of the basilar
cisterns.

New small 15 mm acute parenchymal hemorrhage in the right posterior
temporal lobe.

Vascular: No hyperdense vessel or unexpected calcification.

Skull: Interval right lateral craniotomy with postsurgical changes
including foci of air and edema in the overlying scalp and skin
staples.

Sinuses/Orbits: No acute finding.

Other: None.
IMPRESSION: 1. Interval right lateral craniotomy, placement of extra-axial
drain, partial evacuation of subdural hematoma.
2. Acute extra-axial hemorrhage over the right convexity is now
concentrated subjacent to the craniotomy and increased in thickness
when compared with the preoperative CT which may represent
postoperative change and/or interval hemorrhage.
3. New acute 15 mm intra-axial hemorrhage in right posterior
temporal lobe.
4. Mass effect with stable 10 mm right-to-left midline shift and
mild left ventricular entrapment.

These results will be called to the ordering clinician or
representative by the Radiologist Assistant, and communication
documented in the PACS or zVision Dashboard.

By: Jarvis Jim M.D.
